# Patient Record
Sex: Female | Born: 1940 | Race: White | Hispanic: No | Marital: Married | Smoking: Never smoker
Health system: Southern US, Community
[De-identification: ages and names within clinical notes are randomized; demographics above are authoritative.]

## PROBLEM LIST (undated history)

## (undated) DIAGNOSIS — F32A Depression, unspecified: Secondary | ICD-10-CM

## (undated) DIAGNOSIS — E7800 Pure hypercholesterolemia, unspecified: Secondary | ICD-10-CM

## (undated) DIAGNOSIS — F329 Major depressive disorder, single episode, unspecified: Secondary | ICD-10-CM

## (undated) DIAGNOSIS — I10 Essential (primary) hypertension: Secondary | ICD-10-CM

## (undated) DIAGNOSIS — K219 Gastro-esophageal reflux disease without esophagitis: Secondary | ICD-10-CM

## (undated) DIAGNOSIS — I471 Supraventricular tachycardia, unspecified: Secondary | ICD-10-CM

## (undated) DIAGNOSIS — M1990 Unspecified osteoarthritis, unspecified site: Secondary | ICD-10-CM

## (undated) DIAGNOSIS — N393 Stress incontinence (female) (male): Secondary | ICD-10-CM

## (undated) DIAGNOSIS — E119 Type 2 diabetes mellitus without complications: Secondary | ICD-10-CM

## (undated) DIAGNOSIS — G4730 Sleep apnea, unspecified: Secondary | ICD-10-CM

## (undated) DIAGNOSIS — F419 Anxiety disorder, unspecified: Secondary | ICD-10-CM

## (undated) DIAGNOSIS — I639 Cerebral infarction, unspecified: Secondary | ICD-10-CM

## (undated) HISTORY — DX: Anxiety disorder, unspecified: F41.9

## (undated) HISTORY — PX: SCALENOTOMY W/O RESECTION CERVICAL RIB: SUR1288

## (undated) HISTORY — PX: BREAST SURGERY: SHX581

## (undated) HISTORY — DX: Essential (primary) hypertension: I10

## (undated) HISTORY — PX: EYE SURGERY: SHX253

## (undated) HISTORY — DX: Type 2 diabetes mellitus without complications: E11.9

## (undated) HISTORY — DX: Depression, unspecified: F32.A

## (undated) HISTORY — PX: CHOLECYSTECTOMY: SHX55

## (undated) HISTORY — DX: Stress incontinence (female) (male): N39.3

## (undated) HISTORY — DX: Supraventricular tachycardia, unspecified: I47.10

## (undated) HISTORY — DX: Gastro-esophageal reflux disease without esophagitis: K21.9

## (undated) HISTORY — DX: Supraventricular tachycardia: I47.1

## (undated) HISTORY — DX: Pure hypercholesterolemia, unspecified: E78.00

## (undated) HISTORY — PX: GALLBLADDER SURGERY: SHX652

## (undated) HISTORY — DX: Cerebral infarction, unspecified: I63.9

## (undated) HISTORY — DX: Major depressive disorder, single episode, unspecified: F32.9

## (undated) HISTORY — PX: ABDOMINAL HYSTERECTOMY: SHX81

## (undated) HISTORY — DX: Sleep apnea, unspecified: G47.30

---

## 1975-08-31 HISTORY — PX: BREAST BIOPSY: SHX20

## 1986-08-30 HISTORY — PX: BREAST BIOPSY: SHX20

## 2004-09-29 ENCOUNTER — Ambulatory Visit: Payer: Self-pay | Admitting: Internal Medicine

## 2005-07-20 ENCOUNTER — Ambulatory Visit: Payer: Self-pay | Admitting: Orthopaedic Surgery

## 2005-08-29 ENCOUNTER — Emergency Department: Payer: Self-pay | Admitting: Emergency Medicine

## 2005-10-04 ENCOUNTER — Ambulatory Visit: Payer: Self-pay | Admitting: Internal Medicine

## 2006-04-01 ENCOUNTER — Ambulatory Visit: Payer: Self-pay | Admitting: Internal Medicine

## 2006-05-05 ENCOUNTER — Ambulatory Visit: Payer: Self-pay | Admitting: Internal Medicine

## 2007-02-21 ENCOUNTER — Ambulatory Visit: Payer: Self-pay | Admitting: Unknown Physician Specialty

## 2007-04-11 ENCOUNTER — Ambulatory Visit: Payer: Self-pay | Admitting: Internal Medicine

## 2007-06-09 ENCOUNTER — Ambulatory Visit: Payer: Self-pay | Admitting: Internal Medicine

## 2008-01-03 ENCOUNTER — Ambulatory Visit: Payer: Self-pay | Admitting: Internal Medicine

## 2008-04-01 ENCOUNTER — Ambulatory Visit: Payer: Self-pay | Admitting: Internal Medicine

## 2008-04-17 ENCOUNTER — Ambulatory Visit: Payer: Self-pay | Admitting: Internal Medicine

## 2008-10-17 ENCOUNTER — Ambulatory Visit: Payer: Self-pay | Admitting: Internal Medicine

## 2008-10-24 ENCOUNTER — Ambulatory Visit: Payer: Self-pay | Admitting: Internal Medicine

## 2008-11-28 ENCOUNTER — Ambulatory Visit: Payer: Self-pay | Admitting: Internal Medicine

## 2009-04-30 ENCOUNTER — Ambulatory Visit: Payer: Self-pay | Admitting: Internal Medicine

## 2009-05-13 ENCOUNTER — Ambulatory Visit: Payer: Self-pay | Admitting: Internal Medicine

## 2009-12-08 ENCOUNTER — Ambulatory Visit: Payer: Self-pay | Admitting: Internal Medicine

## 2010-06-03 ENCOUNTER — Ambulatory Visit: Payer: Self-pay | Admitting: Internal Medicine

## 2010-10-02 ENCOUNTER — Ambulatory Visit: Payer: Self-pay | Admitting: Internal Medicine

## 2010-12-16 ENCOUNTER — Emergency Department: Payer: Self-pay | Admitting: Emergency Medicine

## 2011-05-24 ENCOUNTER — Ambulatory Visit: Payer: Self-pay | Admitting: Unknown Physician Specialty

## 2011-05-27 ENCOUNTER — Ambulatory Visit: Payer: Self-pay

## 2011-05-31 ENCOUNTER — Ambulatory Visit: Payer: Self-pay

## 2011-06-15 ENCOUNTER — Ambulatory Visit: Payer: Self-pay | Admitting: Internal Medicine

## 2011-07-01 ENCOUNTER — Ambulatory Visit: Payer: Self-pay

## 2011-07-31 ENCOUNTER — Ambulatory Visit: Payer: Self-pay

## 2011-08-31 ENCOUNTER — Ambulatory Visit: Payer: Self-pay

## 2011-10-21 ENCOUNTER — Ambulatory Visit: Payer: Self-pay | Admitting: Internal Medicine

## 2011-11-16 ENCOUNTER — Ambulatory Visit: Payer: Self-pay | Admitting: Internal Medicine

## 2012-02-08 ENCOUNTER — Ambulatory Visit: Payer: Self-pay | Admitting: Internal Medicine

## 2012-03-30 ENCOUNTER — Ambulatory Visit: Payer: Self-pay | Admitting: Internal Medicine

## 2012-03-31 ENCOUNTER — Emergency Department: Payer: Self-pay | Admitting: *Deleted

## 2012-03-31 LAB — URINALYSIS, COMPLETE
Blood: NEGATIVE
Glucose,UR: NEGATIVE mg/dL (ref 0–75)
Leukocyte Esterase: NEGATIVE
Nitrite: NEGATIVE
Protein: NEGATIVE
RBC,UR: <1 /HPF (ref 0–5)
Squamous Epithelial: 1
WBC UR: <1 /HPF (ref 0–5)

## 2012-03-31 LAB — COMPREHENSIVE METABOLIC PANEL
Albumin: 3.6 g/dL (ref 3.4–5.0)
Alkaline Phosphatase: 63 U/L (ref 50–136)
Anion Gap: 10 (ref 7–16)
BUN: 15 mg/dL (ref 7–18)
Calcium, Total: 9.3 mg/dL (ref 8.5–10.1)
Chloride: 101 mmol/L (ref 98–107)
Co2: 27 mmol/L (ref 21–32)
Creatinine: 1.01 mg/dL (ref 0.60–1.30)
EGFR (African American): >60
EGFR (Non-African Amer.): 56 — ABNORMAL LOW
Glucose: 140 mg/dL — ABNORMAL HIGH (ref 65–99)
Potassium: 3.4 mmol/L — ABNORMAL LOW (ref 3.5–5.1)
SGPT (ALT): 28 U/L
Total Protein: 7.6 g/dL (ref 6.4–8.2)

## 2012-03-31 LAB — CBC
HCT: 38.3 % (ref 35.0–47.0)
HGB: 12.5 g/dL (ref 12.0–16.0)
MCHC: 32.6 g/dL (ref 32.0–36.0)
MCV: 82 fL (ref 80–100)
RDW: 15.7 % — ABNORMAL HIGH (ref 11.5–14.5)
WBC: 11.8 10*3/uL — ABNORMAL HIGH (ref 3.6–11.0)

## 2012-08-01 ENCOUNTER — Ambulatory Visit: Payer: Self-pay | Admitting: Internal Medicine

## 2012-08-14 ENCOUNTER — Ambulatory Visit: Payer: Self-pay

## 2012-09-25 ENCOUNTER — Ambulatory Visit: Payer: Self-pay

## 2012-09-30 ENCOUNTER — Ambulatory Visit: Payer: Self-pay

## 2012-10-28 ENCOUNTER — Ambulatory Visit: Payer: Self-pay

## 2013-01-30 ENCOUNTER — Ambulatory Visit: Payer: Self-pay | Admitting: Internal Medicine

## 2013-08-03 ENCOUNTER — Ambulatory Visit: Payer: Self-pay | Admitting: Internal Medicine

## 2013-08-30 DIAGNOSIS — T8859XA Other complications of anesthesia, initial encounter: Secondary | ICD-10-CM

## 2013-08-30 HISTORY — DX: Other complications of anesthesia, initial encounter: T88.59XA

## 2013-09-14 ENCOUNTER — Ambulatory Visit: Payer: Self-pay | Admitting: Internal Medicine

## 2013-11-09 DIAGNOSIS — I10 Essential (primary) hypertension: Secondary | ICD-10-CM | POA: Insufficient documentation

## 2013-11-09 DIAGNOSIS — N3281 Overactive bladder: Secondary | ICD-10-CM | POA: Insufficient documentation

## 2013-11-09 DIAGNOSIS — E119 Type 2 diabetes mellitus without complications: Secondary | ICD-10-CM | POA: Insufficient documentation

## 2013-11-09 DIAGNOSIS — F32A Depression, unspecified: Secondary | ICD-10-CM | POA: Insufficient documentation

## 2013-11-09 DIAGNOSIS — K219 Gastro-esophageal reflux disease without esophagitis: Secondary | ICD-10-CM | POA: Insufficient documentation

## 2013-11-09 DIAGNOSIS — G459 Transient cerebral ischemic attack, unspecified: Secondary | ICD-10-CM | POA: Insufficient documentation

## 2013-11-09 DIAGNOSIS — H269 Unspecified cataract: Secondary | ICD-10-CM | POA: Insufficient documentation

## 2013-12-19 ENCOUNTER — Ambulatory Visit: Payer: Self-pay | Admitting: Family Medicine

## 2013-12-19 ENCOUNTER — Emergency Department: Payer: Self-pay | Admitting: Internal Medicine

## 2013-12-19 LAB — CLOSTRIDIUM DIFFICILE(ARMC)

## 2013-12-19 LAB — URINALYSIS, COMPLETE
Blood: NEGATIVE
Glucose,UR: NEGATIVE mg/dL (ref 0–75)
Ketone: NEGATIVE
Nitrite: NEGATIVE
PH: 6 (ref 4.5–8.0)
Protein: 30
SPECIFIC GRAVITY: 1.025 (ref 1.003–1.030)

## 2013-12-19 LAB — COMPREHENSIVE METABOLIC PANEL
ALBUMIN: 3.7 g/dL (ref 3.4–5.0)
ALK PHOS: 78 U/L
AST: 81 U/L — AB (ref 15–37)
Anion Gap: 6 — ABNORMAL LOW (ref 7–16)
BUN: 12 mg/dL (ref 7–18)
Bilirubin,Total: 0.6 mg/dL (ref 0.2–1.0)
CALCIUM: 9.1 mg/dL (ref 8.5–10.1)
CO2: 26 mmol/L (ref 21–32)
Chloride: 103 mmol/L (ref 98–107)
Creatinine: 1.07 mg/dL (ref 0.60–1.30)
EGFR (African American): 60 — ABNORMAL LOW
GFR CALC NON AF AMER: 51 — AB
Glucose: 145 mg/dL — ABNORMAL HIGH (ref 65–99)
OSMOLALITY: 272 (ref 275–301)
Potassium: 3.7 mmol/L (ref 3.5–5.1)
SGPT (ALT): 85 U/L — ABNORMAL HIGH (ref 12–78)
SODIUM: 135 mmol/L — AB (ref 136–145)
Total Protein: 8 g/dL (ref 6.4–8.2)

## 2013-12-19 LAB — CBC WITH DIFFERENTIAL/PLATELET
BASOS ABS: 0 10*3/uL (ref 0.0–0.1)
BASOS PCT: 0.6 %
Eosinophil #: 0.1 10*3/uL (ref 0.0–0.7)
Eosinophil %: 0.9 %
HCT: 41.5 % (ref 35.0–47.0)
HGB: 12.9 g/dL (ref 12.0–16.0)
Lymphocyte #: 0.5 10*3/uL — ABNORMAL LOW (ref 1.0–3.6)
Lymphocyte %: 6.7 %
MCH: 24.8 pg — ABNORMAL LOW (ref 26.0–34.0)
MCHC: 31.2 g/dL — ABNORMAL LOW (ref 32.0–36.0)
MCV: 80 fL (ref 80–100)
MONO ABS: 0.5 x10 3/mm (ref 0.2–0.9)
Monocyte %: 7.2 %
NEUTROS PCT: 84.6 %
Neutrophil #: 6.5 10*3/uL (ref 1.4–6.5)
PLATELETS: 277 10*3/uL (ref 150–440)
RBC: 5.2 10*6/uL (ref 3.80–5.20)
RDW: 17.2 % — ABNORMAL HIGH (ref 11.5–14.5)
WBC: 7.7 10*3/uL (ref 3.6–11.0)

## 2013-12-19 LAB — LIPASE, BLOOD: Lipase: 112 U/L (ref 73–393)

## 2013-12-19 LAB — OCCULT BLOOD X 1 CARD TO LAB, STOOL: Occult Blood, Feces: NEGATIVE

## 2013-12-21 LAB — WBCS, STOOL

## 2013-12-21 LAB — STOOL CULTURE

## 2014-02-06 DIAGNOSIS — E785 Hyperlipidemia, unspecified: Secondary | ICD-10-CM | POA: Insufficient documentation

## 2014-02-06 DIAGNOSIS — D649 Anemia, unspecified: Secondary | ICD-10-CM | POA: Insufficient documentation

## 2014-02-06 DIAGNOSIS — E559 Vitamin D deficiency, unspecified: Secondary | ICD-10-CM | POA: Insufficient documentation

## 2014-05-10 ENCOUNTER — Ambulatory Visit: Payer: Self-pay

## 2014-05-10 DIAGNOSIS — R1310 Dysphagia, unspecified: Secondary | ICD-10-CM | POA: Insufficient documentation

## 2014-05-10 DIAGNOSIS — Z800 Family history of malignant neoplasm of digestive organs: Secondary | ICD-10-CM | POA: Insufficient documentation

## 2014-05-16 ENCOUNTER — Encounter: Payer: Self-pay | Admitting: Podiatry

## 2014-05-16 ENCOUNTER — Other Ambulatory Visit: Payer: Self-pay | Admitting: *Deleted

## 2014-05-16 ENCOUNTER — Ambulatory Visit: Payer: Self-pay

## 2014-05-16 ENCOUNTER — Ambulatory Visit (INDEPENDENT_AMBULATORY_CARE_PROVIDER_SITE_OTHER): Payer: Medicare Other | Admitting: Podiatry

## 2014-05-16 VITALS — BP 126/74 | HR 78 | Resp 16 | Ht 66.0 in | Wt 205.0 lb

## 2014-05-16 DIAGNOSIS — E119 Type 2 diabetes mellitus without complications: Secondary | ICD-10-CM

## 2014-05-16 DIAGNOSIS — M722 Plantar fascial fibromatosis: Secondary | ICD-10-CM

## 2014-05-16 NOTE — Patient Instructions (Signed)
Plantar Fasciitis (Heel Spur Syndrome) with Rehab The plantar fascia is a fibrous, ligament-like, soft-tissue structure that spans the bottom of the foot. Plantar fasciitis is a condition that causes pain in the foot due to inflammation of the tissue. SYMPTOMS   Pain and tenderness on the underneath side of the foot.  Pain that worsens with standing or walking. CAUSES  Plantar fasciitis is caused by irritation and injury to the plantar fascia on the underneath side of the foot. Common mechanisms of injury include:  Direct trauma to bottom of the foot.  Damage to a small nerve that runs under the foot where the main fascia attaches to the heel bone.  Stress placed on the plantar fascia due to bone spurs. RISK INCREASES WITH:   Activities that place stress on the plantar fascia (running, jumping, pivoting, or cutting).  Poor strength and flexibility.  Improperly fitted shoes.  Tight calf muscles.  Flat feet.  Failure to warm-up properly before activity.  Obesity. PREVENTION  Warm up and stretch properly before activity.  Allow for adequate recovery between workouts.  Maintain physical fitness:  Strength, flexibility, and endurance.  Cardiovascular fitness.  Maintain a health body weight.  Avoid stress on the plantar fascia.  Wear properly fitted shoes, including arch supports for individuals who have flat feet.  PROGNOSIS  If treated properly, then the symptoms of plantar fasciitis usually resolve without surgery. However, occasionally surgery is necessary.  RELATED COMPLICATIONS   Recurrent symptoms that may result in a chronic condition.  Problems of the lower back that are caused by compensating for the injury, such as limping.  Pain or weakness of the foot during push-off following surgery.  Chronic inflammation, scarring, and partial or complete fascia tear, occurring more often from repeated injections.  TREATMENT  Treatment initially involves the  use of ice and medication to help reduce pain and inflammation. The use of strengthening and stretching exercises may help reduce pain with activity, especially stretches of the Achilles tendon. These exercises may be performed at home or with a therapist. Your caregiver may recommend that you use heel cups of arch supports to help reduce stress on the plantar fascia. Occasionally, corticosteroid injections are given to reduce inflammation. If symptoms persist for greater than 6 months despite non-surgical (conservative), then surgery may be recommended.   MEDICATION   If pain medication is necessary, then nonsteroidal anti-inflammatory medications, such as aspirin and ibuprofen, or other minor pain relievers, such as acetaminophen, are often recommended.  Do not take pain medication within 7 days before surgery.  Prescription pain relievers may be given if deemed necessary by your caregiver. Use only as directed and only as much as you need.  Corticosteroid injections may be given by your caregiver. These injections should be reserved for the most serious cases, because they may only be given a certain number of times.  HEAT AND COLD  Cold treatment (icing) relieves pain and reduces inflammation. Cold treatment should be applied for 10 to 15 minutes every 2 to 3 hours for inflammation and pain and immediately after any activity that aggravates your symptoms. Use ice packs or massage the area with a piece of ice (ice massage).  Heat treatment may be used prior to performing the stretching and strengthening activities prescribed by your caregiver, physical therapist, or athletic trainer. Use a heat pack or soak the injury in warm water.  SEEK IMMEDIATE MEDICAL CARE IF:  Treatment seems to offer no benefit, or the condition worsens.  Any medications   produce adverse side effects.  EXERCISES- RANGE OF MOTION (ROM) AND STRETCHING EXERCISES - Plantar Fasciitis (Heel Spur Syndrome) These exercises  may help you when beginning to rehabilitate your injury. Your symptoms may resolve with or without further involvement from your physician, physical therapist or athletic trainer. While completing these exercises, remember:   Restoring tissue flexibility helps normal motion to return to the joints. This allows healthier, less painful movement and activity.  An effective stretch should be held for at least 30 seconds.  A stretch should never be painful. You should only feel a gentle lengthening or release in the stretched tissue.  RANGE OF MOTION - Toe Extension, Flexion  Sit with your right / left leg crossed over your opposite knee.  Grasp your toes and gently pull them back toward the top of your foot. You should feel a stretch on the bottom of your toes and/or foot.  Hold this stretch for 10 seconds.  Now, gently pull your toes toward the bottom of your foot. You should feel a stretch on the top of your toes and or foot.  Hold this stretch for 10 seconds. Repeat  times. Complete this stretch 3 times per day.   RANGE OF MOTION - Ankle Dorsiflexion, Active Assisted  Remove shoes and sit on a chair that is preferably not on a carpeted surface.  Place right / left foot under knee. Extend your opposite leg for support.  Keeping your heel down, slide your right / left foot back toward the chair until you feel a stretch at your ankle or calf. If you do not feel a stretch, slide your bottom forward to the edge of the chair, while still keeping your heel down.  Hold this stretch for 10 seconds. Repeat 3 times. Complete this stretch 2 times per day.   STRETCH  Gastroc, Standing  Place hands on wall.  Extend right / left leg, keeping the front knee somewhat bent.  Slightly point your toes inward on your back foot.  Keeping your right / left heel on the floor and your knee straight, shift your weight toward the wall, not allowing your back to arch.  You should feel a gentle stretch  in the right / left calf. Hold this position for 10 seconds. Repeat 3 times. Complete this stretch 2 times per day.  STRETCH  Soleus, Standing  Place hands on wall.  Extend right / left leg, keeping the other knee somewhat bent.  Slightly point your toes inward on your back foot.  Keep your right / left heel on the floor, bend your back knee, and slightly shift your weight over the back leg so that you feel a gentle stretch deep in your back calf.  Hold this position for 10 seconds. Repeat 3 times. Complete this stretch 2 times per day.  STRETCH  Gastrocsoleus, Standing  Note: This exercise can place a lot of stress on your foot and ankle. Please complete this exercise only if specifically instructed by your caregiver.   Place the ball of your right / left foot on a step, keeping your other foot firmly on the same step.  Hold on to the wall or a rail for balance.  Slowly lift your other foot, allowing your body weight to press your heel down over the edge of the step.  You should feel a stretch in your right / left calf.  Hold this position for 10 seconds.  Repeat this exercise with a slight bend in your right /   left knee. Repeat 3 times. Complete this stretch 2 times per day.   STRENGTHENING EXERCISES - Plantar Fasciitis (Heel Spur Syndrome)  These exercises may help you when beginning to rehabilitate your injury. They may resolve your symptoms with or without further involvement from your physician, physical therapist or athletic trainer. While completing these exercises, remember:   Muscles can gain both the endurance and the strength needed for everyday activities through controlled exercises.  Complete these exercises as instructed by your physician, physical therapist or athletic trainer. Progress the resistance and repetitions only as guided.  STRENGTH - Towel Curls  Sit in a chair positioned on a non-carpeted surface.  Place your foot on a towel, keeping your heel  on the floor.  Pull the towel toward your heel by only curling your toes. Keep your heel on the floor. Repeat 3 times. Complete this exercise 2 times per day.  STRENGTH - Ankle Inversion  Secure one end of a rubber exercise band/tubing to a fixed object (table, pole). Loop the other end around your foot just before your toes.  Place your fists between your knees. This will focus your strengthening at your ankle.  Slowly, pull your big toe up and in, making sure the band/tubing is positioned to resist the entire motion.  Hold this position for 10 seconds.  Have your muscles resist the band/tubing as it slowly pulls your foot back to the starting position. Repeat 3 times. Complete this exercises 2 times per day.  Document Released: 08/16/2005 Document Revised: 11/08/2011 Document Reviewed: 11/28/2008 [facility] Patient Information 2014 [place], [place]. Diabetes and Foot Care Diabetes may cause you to have problems because of poor blood supply (circulation) to your feet and legs. This may cause the skin on your feet to become thinner, break easier, and heal more slowly. Your skin may become dry, and the skin may peel and crack. You may also have nerve damage in your legs and feet causing decreased feeling in them. You may not notice minor injuries to your feet that could lead to infections or more serious problems. Taking care of your feet is one of the most important things you can do for yourself.  HOME CARE INSTRUCTIONS  Wear shoes at all times, even in the house. Do not go barefoot. Bare feet are easily injured.  Check your feet daily for blisters, cuts, and redness. If you cannot see the bottom of your feet, use a mirror or ask someone for help.  Wash your feet with warm water (do not use hot water) and mild soap. Then pat your feet and the areas between your toes until they are completely dry. Do not soak your feet as this can dry your skin.  Apply a moisturizing lotion or petroleum  jelly (that does not contain alcohol and is unscented) to the skin on your feet and to dry, brittle toenails. Do not apply lotion between your toes.  Trim your toenails straight across. Do not dig under them or around the cuticle. File the edges of your nails with an emery board or nail file.  Do not cut corns or calluses or try to remove them with medicine.  Wear clean socks or stockings every day. Make sure they are not too tight. Do not wear knee-high stockings since they may decrease blood flow to your legs.  Wear shoes that fit properly and have enough cushioning. To break in new shoes, wear them for just a few hours a day. This prevents you from injuring your  feet. Always look in your shoes before you put them on to be sure there are no objects inside.  Do not cross your legs. This may decrease the blood flow to your feet.  If you find a minor scrape, cut, or break in the skin on your feet, keep it and the skin around it clean and dry. These areas may be cleansed with mild soap and water. Do not cleanse the area with peroxide, alcohol, or iodine.  When you remove an adhesive bandage, be sure not to damage the skin around it.  If you have a wound, look at it several times a day to make sure it is healing.  Do not use heating pads or hot water bottles. They may burn your skin. If you have lost feeling in your feet or legs, you may not know it is happening until it is too late.  Make sure your health care provider performs a complete foot exam at least annually or more often if you have foot problems. Report any cuts, sores, or bruises to your health care provider immediately. SEEK MEDICAL CARE IF:   You have an injury that is not healing.  You have cuts or breaks in the skin.  You have an ingrown nail.  You notice redness on your legs or feet.  You feel burning or tingling in your legs or feet.  You have pain or cramps in your legs and feet.  Your legs or feet are numb.  Your  feet always feel cold. SEEK IMMEDIATE MEDICAL CARE IF:   There is increasing redness, swelling, or pain in or around a wound.  There is a red line that goes up your leg.  Pus is coming from a wound.  You develop a fever or as directed by your health care provider.  You notice a bad smell coming from an ulcer or wound. Document Released: 08/13/2000 Document Revised: 04/18/2013 Document Reviewed: 01/23/2013 [facility] Patient Information 2015 [place], [place]. This information is not intended to replace advice given to you by your health care provider. Make sure you discuss any questions you have with your health care provider.

## 2014-05-16 NOTE — Progress Notes (Signed)
   Subjective:    Patient ID: Kristen Guerra, female    DOB: May 21, 1941, 73 y.o.   MRN: [id number]  HPI Comments: About two weeks ago was walking up a ramp and all of a sudden i could not take another step.  Left plantar heel pain, it sometimes it feels like a jabbing pain. Went to urgent care and they put me in a boot and gave me oxycodone for it. i am diabetic been for 3 years the last a1c was 6.8   Foot Injury       Review of Systems  Constitutional: Positive for fatigue.  HENT: Positive for trouble swallowing.   Respiratory: Positive for cough.   Gastrointestinal: Positive for diarrhea and constipation.       Bloating  Endocrine:       Diabetic   Musculoskeletal: Positive for back pain.  All other systems reviewed and are negative.      Objective:   Physical Exam: I have reviewed her past medical history medications allergies surgeries social history and review of systems. Pulses are strongly palpable bilateral. Neurologic sensorium is intact per Semmes-Weinstein monofilament. Deep tendon reflexes are intact bilateral muscle strength is 5 over 5 dorsiflexors plantar flexors inverters everters all intrinsic musculature is intact. Orthopedic evaluation demonstrates ambulation with a cane an antalgic gait to the left side of the body. She has pain on palpation of the medial calcaneal tubercle of the left heel. Otherwise she has a full range of motion all joints distal to the ankle. She has no pain on medial and lateral compression of the calcaneus. Radiographic evaluation does demonstrate obliteration of the soft tissue margin near the insertion site of the plantar fascia. At this point we're considering either plantar fasciitis or a plantar fascial tear.        Assessment & Plan:  Assessment: Plantar fasciitis left possible plantar fascial tear left.  Plan: Discussed etiology pathology conservative versus surgical therapies. Injected the left heel with Kenalog and Marcaine.  She will continue the use of her short Cam Walker we discussed boot socks. We also dispensed a night splint.

## 2014-06-17 ENCOUNTER — Ambulatory Visit: Payer: Medicare Other | Admitting: Podiatry

## 2014-06-24 ENCOUNTER — Ambulatory Visit: Payer: Self-pay | Admitting: Unknown Physician Specialty

## 2014-06-26 ENCOUNTER — Encounter: Payer: Self-pay | Admitting: Podiatry

## 2014-06-26 ENCOUNTER — Ambulatory Visit (INDEPENDENT_AMBULATORY_CARE_PROVIDER_SITE_OTHER): Payer: Medicare Other | Admitting: Podiatry

## 2014-06-26 DIAGNOSIS — M722 Plantar fascial fibromatosis: Secondary | ICD-10-CM

## 2014-06-26 DIAGNOSIS — E119 Type 2 diabetes mellitus without complications: Secondary | ICD-10-CM

## 2014-06-26 NOTE — Progress Notes (Signed)
She presents today for follow-up of her plantar fasciitis. She states that she is 100% better. She continues all conservative therapies at home.  Objective: Vital signs are stable she is alert and oriented 3. She has no pain on palpation medial calcaneal tubercle right foot.  Assessment: Well-healing plantar fasciitis near 100%.  Plan: Continue all conservative therapies 1 month.

## 2014-11-01 ENCOUNTER — Ambulatory Visit: Payer: Self-pay | Admitting: Internal Medicine

## 2014-11-19 ENCOUNTER — Ambulatory Visit: Payer: Self-pay | Admitting: Physician Assistant

## 2014-12-23 LAB — SURGICAL PATHOLOGY

## 2014-12-31 ENCOUNTER — Other Ambulatory Visit: Payer: Self-pay

## 2014-12-31 DIAGNOSIS — Z1231 Encounter for screening mammogram for malignant neoplasm of breast: Secondary | ICD-10-CM

## 2015-01-21 ENCOUNTER — Ambulatory Visit
Admission: RE | Admit: 2015-01-21 | Discharge: 2015-01-21 | Disposition: A | Discharge status: Home / Self Care | Payer: Medicare Other | Source: Ambulatory Visit | Attending: Internal Medicine | Admitting: Internal Medicine

## 2015-01-21 ENCOUNTER — Other Ambulatory Visit: Payer: Self-pay

## 2015-01-21 DIAGNOSIS — Z1231 Encounter for screening mammogram for malignant neoplasm of breast: Secondary | ICD-10-CM

## 2015-08-31 DIAGNOSIS — I639 Cerebral infarction, unspecified: Secondary | ICD-10-CM

## 2015-08-31 HISTORY — DX: Cerebral infarction, unspecified: I63.9

## 2015-11-12 ENCOUNTER — Emergency Department
Admission: EM | Admit: 2015-11-12 | Discharge: 2015-11-12 | Disposition: A | Discharge status: Home / Self Care | Payer: Medicare Other | Attending: Emergency Medicine | Admitting: Emergency Medicine

## 2015-11-12 DIAGNOSIS — Z791 Long term (current) use of non-steroidal anti-inflammatories (NSAID): Secondary | ICD-10-CM | POA: Diagnosis not present

## 2015-11-12 DIAGNOSIS — E119 Type 2 diabetes mellitus without complications: Secondary | ICD-10-CM | POA: Diagnosis not present

## 2015-11-12 DIAGNOSIS — F410 Panic disorder [episodic paroxysmal anxiety] without agoraphobia: Secondary | ICD-10-CM

## 2015-11-12 DIAGNOSIS — Z7984 Long term (current) use of oral hypoglycemic drugs: Secondary | ICD-10-CM | POA: Insufficient documentation

## 2015-11-12 DIAGNOSIS — I10 Essential (primary) hypertension: Secondary | ICD-10-CM | POA: Diagnosis not present

## 2015-11-12 DIAGNOSIS — Z880 Allergy status to penicillin: Secondary | ICD-10-CM | POA: Insufficient documentation

## 2015-11-12 DIAGNOSIS — Z79899 Other long term (current) drug therapy: Secondary | ICD-10-CM | POA: Diagnosis not present

## 2015-11-12 DIAGNOSIS — R251 Tremor, unspecified: Secondary | ICD-10-CM | POA: Diagnosis present

## 2015-11-12 LAB — COMPREHENSIVE METABOLIC PANEL
ALT: 16 U/L (ref 14–54)
ANION GAP: 7 (ref 5–15)
AST: 18 U/L (ref 15–41)
Albumin: 3.8 g/dL (ref 3.5–5.0)
Alkaline Phosphatase: 64 U/L (ref 38–126)
BUN: 18 mg/dL (ref 6–20)
CHLORIDE: 105 mmol/L (ref 101–111)
CO2: 26 mmol/L (ref 22–32)
Calcium: 9.4 mg/dL (ref 8.9–10.3)
Creatinine, Ser: 0.87 mg/dL (ref 0.44–1.00)
GFR calc Af Amer: >60 mL/min (ref >60)
GFR calc non Af Amer: >60 mL/min (ref >60)
Glucose, Bld: 131 mg/dL — ABNORMAL HIGH (ref 65–99)
Potassium: 3.1 mmol/L — ABNORMAL LOW (ref 3.5–5.1)
Sodium: 138 mmol/L (ref 135–145)
Total Bilirubin: 0.5 mg/dL (ref 0.3–1.2)
Total Protein: 7.4 g/dL (ref 6.5–8.1)

## 2015-11-12 LAB — CBC
HCT: 39 % (ref 35.0–47.0)
Hemoglobin: 12.2 g/dL (ref 12.0–16.0)
MCH: 23.8 pg — AB (ref 26.0–34.0)
MCHC: 31.2 g/dL — ABNORMAL LOW (ref 32.0–36.0)
MCV: 76.3 fL — AB (ref 80.0–100.0)
Platelets: 394 10*3/uL (ref 150–440)
RBC: 5.11 MIL/uL (ref 3.80–5.20)
RDW: 18.5 % — AB (ref 11.5–14.5)
WBC: 12.7 10*3/uL — AB (ref 3.6–11.0)

## 2015-11-12 LAB — TROPONIN I

## 2015-11-12 MED ORDER — LORAZEPAM 1 MG PO TABS
1.0000 mg | ORAL_TABLET | Freq: Once | ORAL | Status: AC
  Administered 2015-11-12: 1 mg via ORAL
  Filled 2015-11-12: qty 1

## 2015-11-12 NOTE — Discharge Instructions (Signed)

## 2015-11-12 NOTE — ED Provider Notes (Signed)
[facility] Emergency Department Provider Note  ____________________________________________  Time seen: 4:00 AM  I have reviewed the triage vital signs and the nursing notes.   HISTORY  Chief Complaint Shaking and Hypertension      HPI Kristen Guerra is a 75 y.o. female presents with "feeling restless tonight". Patient states that she awoke feeling restless and a such checked her blood pressure blood sugar which were slightly higher than normal. Patient states this made her very anxious and as such she started reading about it on the Internet which made her more anxious.     Past Medical History  Diagnosis Date  . Diabetes mellitus without complication ([place])   . Stroke ([place])   . Hypertension   . High cholesterol     There are no active problems to display for this patient.   Past Surgical History  Procedure Laterality Date  . Abdominal hysterectomy    . Breast surgery    . Gallbladder surgery    . Breast biopsy Right 1977    negative  . Breast biopsy Right 1988    negative    Current Outpatient Rx  Name  Route  Sig  Dispense  Refill  . chlorpheniramine-HYDROcodone (TUSSIONEX PENNKINETIC ER) 10-8 MG/5ML LQCR   Oral   Take 5 mLs by mouth.         . Cholecalciferol 10000 UNITS CAPS   Oral   Take by mouth.         . clobetasol ointment (TEMOVATE) 0.05 %               . clopidogrel (PLAVIX) 75 MG tablet               . cyanocobalamin 1000 MCG tablet   Oral   Take 100 mcg by mouth daily.         . DULoxetine (CYMBALTA) 60 MG capsule               . glimepiride (AMARYL) 4 MG tablet   Oral   Take 4 mg by mouth daily with breakfast.         . hydrochlorothiazide (HYDRODIURIL) 25 MG tablet               . HYDROcodone-acetaminophen (NORCO/VICODIN) 5-325 MG per tablet               . JANUVIA 100 MG tablet               . levofloxacin (LEVAQUIN) 500 MG tablet               . losartan  (COZAAR) 25 MG tablet               . Magnesium Oxide -Mg Supplement 400 MG CAPS   Oral   Take by mouth.         . metoprolol succinate (TOPROL-XL) 25 MG 24 hr tablet               . naproxen sodium (ANAPROX) 220 MG tablet   Oral   Take 220 mg by mouth 2 (two) times daily with a meal.         . NEXIUM 40 MG capsule               . ONE TOUCH ULTRA TEST test strip               . oxybutynin (DITROPAN-XL) 10 MG 24 hr tablet               .  verapamil (CALAN-SR) 180 MG CR tablet                 Allergies Amoxicillin; Lidocaine; and Penicillins  Family History  Problem Relation Age of Onset  . Colon cancer Father     Social History Social History  Substance Use Topics  . Smoking status: Never Smoker   . Smokeless tobacco: Never Used  . Alcohol Use: None    Review of Systems  Constitutional: Negative for fever. Eyes: Negative for visual changes. ENT: Negative for sore throat. Cardiovascular: Negative for chest pain. Respiratory: Negative for shortness of breath. Gastrointestinal: Negative for abdominal pain, vomiting and diarrhea. Genitourinary: Negative for dysuria. Musculoskeletal: Negative for back pain. Skin: Negative for rash. Neurological: Negative for headaches, focal weakness or numbness. Psychiatric: Positive for anxiety  10-point ROS otherwise negative.  ____________________________________________   PHYSICAL EXAM:  VITAL SIGNS: ED Triage Vitals  Enc Vitals Group     BP 11/12/15 0145 135/70 mmHg     Pulse Rate 11/12/15 0145 90     Resp 11/12/15 0145 20     Temp 11/12/15 0145 98.2 F (36.8 C)     Temp Source 11/12/15 0145 Oral     SpO2 11/12/15 0145 96 %     Weight 11/12/15 0145 190 lb (86.183 kg)     Height 11/12/15 0145 5\' 6"  (1.676 m)     Head Cir --      Peak Flow --      Pain Score 11/12/15 0146 0     Pain Loc --      Pain Edu? --      Excl. in [place]? --     Constitutional: Alert and oriented. Well  appearing and in no distress. Eyes: Conjunctivae are normal. PERRL. Normal extraocular movements. ENT   Head: Normocephalic and atraumatic.   Nose: No congestion/rhinnorhea.   Mouth/Throat: Mucous membranes are moist.   Neck: No stridor. Hematological/Lymphatic/Immunilogical: No cervical lymphadenopathy. Cardiovascular: Normal rate, regular rhythm. Normal and symmetric distal pulses are present in all extremities. No murmurs, rubs, or gallops. Respiratory: Normal respiratory effort without tachypnea nor retractions. Breath sounds are clear and equal bilaterally. No wheezes/rales/rhonchi. Gastrointestinal: Soft and nontender. No distention. There is no CVA tenderness. Genitourinary: deferred Musculoskeletal: Nontender with normal range of motion in all extremities. No joint effusions.  No lower extremity tenderness nor edema. Neurologic:  Normal speech and language. No gross focal neurologic deficits are appreciated. Speech is normal.  Skin:  Skin is warm, dry and intact. No rash noted. Psychiatric: Anxious affect . Speech (rapid) and behavior are normal. Patient exhibits appropriate insight and judgment.  ____________________________________________    LABS (pertinent positives/negatives)  Labs Reviewed  CBC - Abnormal; Notable for the following:    WBC 12.7 (*)    MCV 76.3 (*)    MCH 23.8 (*)    MCHC 31.2 (*)    RDW 18.5 (*)    All other components within normal limits  COMPREHENSIVE METABOLIC PANEL - Abnormal; Notable for the following:    Potassium 3.1 (*)    Glucose, Bld 131 (*)    All other components within normal limits  TROPONIN I     ____________________________________________   EKG  ED ECG REPORT I, [place] [facility] Davidlee Jeanbaptiste, the attending physician, personally viewed and interpreted this ECG.   Date: 11/12/2015  EKG Time: 1:49 AM  Rate: 90  Rhythm: Normal sinus rhythm  Axis: Normal  Intervals: Normal  ST&T Change:  None   __________________  INITIAL IMPRESSION /  ASSESSMENT AND PLAN / ED COURSE  Pertinent labs & imaging results that were available during my care of the patient were reviewed by me and considered in my medical decision making (see chart for details).  History of physical exam consistent with acute panic attack. Patient has no pain or any other medical concerns at this time other than feeling anxious. Patient received Ativan 1 mg by mouth  ____________________________________________   FINAL CLINICAL IMPRESSION(S) / ED DIAGNOSES  Final diagnoses:  Panic attack      Gregor Hams, MD 11/12/15 [phone]

## 2015-11-12 NOTE — ED Notes (Signed)
Patient feeling restless tonight. After being in bed, not really asleep but just "awake and restless, checked her blood pressure and it was higher than normal. States she felt shaky so checked her blood pressure, blood sugar, temperature and pulse and just "didn't know what else to do." Patient very anxious in triage and talking rapidly about events at the dentist last week. States she had gas and she couldn't wake up. Thinks this might have something to do with it.

## 2016-01-28 ENCOUNTER — Encounter: Payer: Medicare Other | Attending: Internal Medicine | Admitting: *Deleted

## 2016-01-28 ENCOUNTER — Encounter: Payer: Self-pay | Admitting: *Deleted

## 2016-01-28 VITALS — BP 112/60 | Ht 66.0 in | Wt 191.9 lb

## 2016-01-28 DIAGNOSIS — E119 Type 2 diabetes mellitus without complications: Secondary | ICD-10-CM | POA: Diagnosis present

## 2016-01-28 NOTE — Patient Instructions (Addendum)
Check blood sugars 1 x day before breakfast or 2 hrs after supper every day Exercise: Walk for  20  minutes 3 days a week and gradually increase to 150 minutes/week Eat 3 meals day, 2  snacks a day Space meals 4-6 hours apart Allow 2-3 hours between meals and snacks Complete 3 Day Food Record and bring to next appt Bring blood sugar records to the next appointment Bring list of medications to next appointment Return on:  Wednesday July 19 at 11:00 am with [facility] (dietitian)

## 2016-01-29 NOTE — Progress Notes (Signed)
Diabetes Self-Management Education  Visit Type: First/Initial  Appt. Start Time: 1100 Appt. End Time: [id number]  01/28/2016  [name], identified by name and date of birth, is a 75 y.o. female with a diagnosis of Diabetes: Type 2.   ASSESSMENT  Blood pressure 112/60, height 5\' 6"  (1.676 m), weight 191 lb 14.4 oz (87.045 kg). Body mass index is 30.99 kg/(m^2).      Diabetes Self-Management Education - 01/28/16 1216    Visit Information   Visit Type First/Initial   Initial Visit   Diabetes Type Type 2   Are you currently following a meal plan? Yes   What type of meal plan do you follow? "diabetic"   Are you taking your medications as prescribed? Yes  Pt unsure of names and dosages of medications. Instructed her to bring list to next visit.    Date Diagnosed 5 years ago   Psychosocial Assessment   Patient Belief/Attitude about Diabetes Motivated to manage diabetes   Self-care barriers None   Self-management support Doctor's office;Family   Patient Concerns Nutrition/Meal planning;Medication;Glycemic Control;Weight Control   Special Needs None   Preferred Learning Style Auditory;Visual   Learning Readiness Ready   How often do you need to have someone help you when you read instructions, pamphlets, or other written materials from your doctor or pharmacy? 1 - Never   What is the last grade level you completed in school? MBA   Pre-Education Assessment   Patient understands the diabetes disease and treatment process. Needs Review   Patient understands incorporating nutritional management into lifestyle. Needs Review   Patient undertands incorporating physical activity into lifestyle. Needs Instruction   Patient understands using medications safely. Needs Instruction   Patient understands monitoring blood glucose, interpreting and using results Needs Review   Patient understands prevention, detection, and treatment of acute complications. Needs Review   Patient understands  prevention, detection, and treatment of chronic complications. Needs Review   Patient understands how to develop strategies to address psychosocial issues. Needs Review   Patient understands how to develop strategies to promote health/change behavior. Needs Review   Complications   Last HgB A1C per patient/outside source 6.7 %  12/23/15   How often do you check your blood sugar? 1-2 times/day   Fasting Blood glucose range (mg/dL) 70-129;130-179  Pt reports FBG's 90-130's mg/dL.    Have you had a dilated eye exam in the past 12 months? Yes   Have you had a dental exam in the past 12 months? Yes   Are you checking your feet? Yes   How many days per week are you checking your feet? 4   Dietary Intake   Breakfast egg 2 x week; cereal and milk; fruit   Snack (morning) crackers   Lunch soups; meat and veggies; salad and yogurt; cereal and milk   Snack (afternoon) fruit or popcicle   Dinner BBQ; meat and salad; meat and veggies, sweet potato   Beverage(s) water, coffee, unsweetened drinks   Exercise   Exercise Type ADL's   Patient Education   Previous Diabetes Education Yes (please comment)  approx 5 years ago here at [facility]   Disease state  Explored patient's options for treatment of their diabetes   Nutrition management  Role of diet in the treatment of diabetes and the relationship between the three main macronutrients and blood glucose level;Food label reading, portion sizes and measuring food.   Physical activity and exercise  Role of exercise on diabetes management, blood pressure control and  cardiac health.   Medications Reviewed patients medication for diabetes, action, purpose, timing of dose and side effects.   Monitoring Purpose and frequency of SMBG.;Identified appropriate SMBG and/or A1C goals.   Chronic complications Relationship between chronic complications and blood glucose control   Psychosocial adjustment Identified and addressed patients feelings and concerns about diabetes    Individualized Goals (developed by patient)   Reducing Risk Improve blood sugars Decrease medications Prevent diabetes complications Lose weight Become more fit   Outcomes   Expected Outcomes Demonstrated interest in learning. Expect positive outcomes      Individualized Plan for Diabetes Self-Management Training:   Learning Objective:  Patient will have a greater understanding of diabetes self-management. Patient education plan is to attend individual and/or group sessions per assessed needs and concerns.   Plan:   Patient Instructions  Check blood sugars 1 x day before breakfast or 2 hrs after supper every day Exercise: Walk for  20  minutes 3 days a week and gradually increase to 150 minutes/week Eat 3 meals day, 2  snacks a day Space meals 4-6 hours apart Allow 2-3 hours between meals and snacks Complete 3 Day Food Record and bring to next appt Bring blood sugar records to the next appointment Bring list of medications to next appointment Return on:  Wednesday July 19 at 11:00 am with [facility] (dietitian)   Expected Outcomes:  Demonstrated interest in learning. Expect positive outcomes  Education material provided:  General Meal Planning Guidelines Simple Meal Plan 3 Day Food Record  If problems or questions, patient to contact team via:  Kristen Drilling, RN, [place], CDE [phone]  Future DSME appointment:   March 17, 2016 for appointment with dietitian

## 2016-02-13 ENCOUNTER — Encounter: Payer: Self-pay | Admitting: *Deleted

## 2016-02-13 NOTE — Progress Notes (Signed)
Patient came by today with list of current medications. Added them to her list.

## 2016-02-17 ENCOUNTER — Other Ambulatory Visit: Payer: Self-pay | Admitting: Internal Medicine

## 2016-02-17 DIAGNOSIS — Z1231 Encounter for screening mammogram for malignant neoplasm of breast: Secondary | ICD-10-CM

## 2016-03-01 ENCOUNTER — Ambulatory Visit
Admission: RE | Admit: 2016-03-01 | Discharge: 2016-03-01 | Disposition: A | Discharge status: Home / Self Care | Payer: Medicare Other | Source: Ambulatory Visit | Attending: Internal Medicine | Admitting: Internal Medicine

## 2016-03-01 ENCOUNTER — Other Ambulatory Visit: Payer: Self-pay | Admitting: Internal Medicine

## 2016-03-01 DIAGNOSIS — Z1231 Encounter for screening mammogram for malignant neoplasm of breast: Secondary | ICD-10-CM | POA: Insufficient documentation

## 2016-03-17 ENCOUNTER — Encounter: Payer: Medicare Other | Attending: Internal Medicine | Admitting: Dietician

## 2016-03-17 ENCOUNTER — Encounter: Payer: Self-pay | Admitting: Dietician

## 2016-03-17 VITALS — BP 116/58 | Ht 66.0 in | Wt 193.8 lb

## 2016-03-17 DIAGNOSIS — E119 Type 2 diabetes mellitus without complications: Secondary | ICD-10-CM | POA: Diagnosis not present

## 2016-03-17 NOTE — Patient Instructions (Signed)
   Keep good control of carbohydrates in your meals: aim for 2-3 servings total with each meal, or 30-45grams carbohydrate.   Choose snacks with 15-20 grams carbohydrate, or 1 serving.   Start some exercise on a regular basis; start with short duration and gradually increase time and frequency. Think about things you enjoy or something new you'd like to try.

## 2016-03-17 NOTE — Progress Notes (Signed)
Diabetes Self-Management Education  Visit Type:  Follow-up  Appt. Start Time: 1100 Appt. End Time: 1200  03/17/2016  [name], identified by name and date of birth, is a 75 y.o. female with a diagnosis of Diabetes:  .   ASSESSMENT  Blood pressure 116/58, height 5\' 6"  (1.676 m), weight 193 lb 12.8 oz (87.907 kg). Body mass index is 31.3 kg/(m^2).       Diabetes Self-Management Education - [id number]    Complications   How often do you check your blood sugar? 1-2 times/day   Fasting Blood glucose range (mg/dL) 70-129;130-179   Postprandial Blood glucose range (mg/dL) --  stopped testing after meals since BGs were usually in good ocntrol  at that time   Number of hypoglycemic episodes per month 0   Have you had a dilated eye exam in the past 12 months? Yes   Have you had a dental exam in the past 12 months? Yes   Are you checking your feet? Yes   How many days per week are you checking your feet? 4   Dietary Intake   Breakfast 3 meals and 2-3 snacks daily; not always at the same times.    Exercise   Exercise Type ADL's  multiple activities, including social activities   Patient Education   Nutrition management  Role of diet in the treatment of diabetes and the relationship between the three main macronutrients and blood glucose level;Food label reading, portion sizes and measuring food.;Meal options for control of blood glucose level and chronic complications.;Other (comment)  plate method meal planning   Physical activity and exercise  Role of exercise on diabetes management, blood pressure control and cardiac health.;Helped patient identify appropriate exercises in relation to his/her diabetes, diabetes complications and other health issue.   Monitoring Taught/discussed recording of test results and interpretation of SMBG.   Post-Education Assessment   Patient understands the diabetes disease and treatment process. Demonstrates understanding / competency   Patient  understands incorporating nutritional management into lifestyle. Demonstrates understanding / competency   Patient undertands incorporating physical activity into lifestyle. Needs Review  needs support   Patient understands using medications safely. Demonstrates understanding / competency   Patient understands monitoring blood glucose, interpreting and using results Demonstrates understanding / competency   Patient understands prevention, detection, and treatment of acute complications. Demonstrates understanding / competency   Patient understands prevention, detection, and treatment of chronic complications. Demonstrates understanding / competency   Patient understands how to develop strategies to address psychosocial issues. Needs Review  needs support   Patient understands how to develop strategies to promote health/change behavior. Demonstrates understanding / competency   Outcomes   Program Status Completed      Learning Objective:  Patient will have a greater understanding of diabetes self-management. Patient education plan is to attend individual and/or group sessions per assessed needs and concerns.   Plan:  Patient reports high level of stress in recent weeks; she has started meeting with a therapist for counseling.   Patient Instructions   Keep good control of carbohydrates in your meals: aim for 2-3 servings total with each meal, or 30-45grams carbohydrate.   Choose snacks with 15-20 grams carbohydrate, or 1 serving.   Start some exercise on a regular basis; start with short duration and gradually increase time and frequency. Think about things you enjoy or something new you'd like to try.     Expected Outcomes:  Demonstrated interest in learning. Expect positive outcomes  Education material provided:  Plate planner with food lists; Quick and Healthy Meal Ideas  If problems or questions, patient to contact team via:  Phone

## 2016-06-14 ENCOUNTER — Other Ambulatory Visit: Payer: Self-pay | Admitting: Internal Medicine

## 2016-06-14 DIAGNOSIS — R109 Unspecified abdominal pain: Secondary | ICD-10-CM

## 2016-06-22 ENCOUNTER — Ambulatory Visit
Admission: RE | Admit: 2016-06-22 | Discharge: 2016-06-22 | Disposition: A | Discharge status: Home / Self Care | Payer: Medicare Other | Source: Ambulatory Visit | Attending: Internal Medicine | Admitting: Internal Medicine

## 2016-06-22 DIAGNOSIS — R109 Unspecified abdominal pain: Secondary | ICD-10-CM | POA: Diagnosis present

## 2016-09-03 DIAGNOSIS — Z794 Long term (current) use of insulin: Secondary | ICD-10-CM | POA: Insufficient documentation

## 2016-09-29 ENCOUNTER — Emergency Department: Payer: Medicare Other

## 2016-09-29 ENCOUNTER — Emergency Department
Admission: EM | Admit: 2016-09-29 | Discharge: 2016-09-30 | Disposition: A | Discharge status: Home / Self Care | Payer: Medicare Other | Attending: Emergency Medicine | Admitting: Emergency Medicine

## 2016-09-29 ENCOUNTER — Encounter: Payer: Self-pay | Admitting: Emergency Medicine

## 2016-09-29 DIAGNOSIS — S022XXA Fracture of nasal bones, initial encounter for closed fracture: Secondary | ICD-10-CM | POA: Diagnosis not present

## 2016-09-29 DIAGNOSIS — S0083XA Contusion of other part of head, initial encounter: Secondary | ICD-10-CM

## 2016-09-29 DIAGNOSIS — W19XXXA Unspecified fall, initial encounter: Secondary | ICD-10-CM

## 2016-09-29 DIAGNOSIS — Y9389 Activity, other specified: Secondary | ICD-10-CM | POA: Insufficient documentation

## 2016-09-29 DIAGNOSIS — E119 Type 2 diabetes mellitus without complications: Secondary | ICD-10-CM | POA: Insufficient documentation

## 2016-09-29 DIAGNOSIS — W01198A Fall on same level from slipping, tripping and stumbling with subsequent striking against other object, initial encounter: Secondary | ICD-10-CM | POA: Insufficient documentation

## 2016-09-29 DIAGNOSIS — I10 Essential (primary) hypertension: Secondary | ICD-10-CM | POA: Diagnosis not present

## 2016-09-29 DIAGNOSIS — Z79899 Other long term (current) drug therapy: Secondary | ICD-10-CM | POA: Insufficient documentation

## 2016-09-29 DIAGNOSIS — Z794 Long term (current) use of insulin: Secondary | ICD-10-CM | POA: Insufficient documentation

## 2016-09-29 DIAGNOSIS — S0990XA Unspecified injury of head, initial encounter: Secondary | ICD-10-CM | POA: Diagnosis present

## 2016-09-29 DIAGNOSIS — Y92009 Unspecified place in unspecified non-institutional (private) residence as the place of occurrence of the external cause: Secondary | ICD-10-CM | POA: Diagnosis not present

## 2016-09-29 DIAGNOSIS — Y999 Unspecified external cause status: Secondary | ICD-10-CM | POA: Diagnosis not present

## 2016-09-29 MED ORDER — ACETAMINOPHEN 500 MG PO TABS
1000.0000 mg | ORAL_TABLET | Freq: Once | ORAL | Status: AC
  Administered 2016-09-29: 1000 mg via ORAL
  Filled 2016-09-29: qty 2

## 2016-09-29 MED ORDER — OXYMETAZOLINE HCL 0.05 % NA SOLN
1.0000 | Freq: Once | NASAL | Status: AC
  Administered 2016-09-29: 1 via NASAL
  Filled 2016-09-29: qty 15

## 2016-09-29 NOTE — ED Provider Notes (Signed)
[facility] Emergency Department Provider Note        Time seen: ----------------------------------------- 10:12 AM on 09/29/2016 -----------------------------------------    I have reviewed the triage vital signs and the nursing notes.   HISTORY  Chief Complaint Fall    HPI Kristen Guerra is a 76 y.o. female who presents to ER after she tripped and fell. Patient was putting away Christmas  decorationswhen the event occurred. Patient states she tripped over a box of decorations and hit her face primarily. She was complaining of left upper chest pain, her nose was bleeding and she was concerned about swelling on her forehead. She denies any recent illness or severe pain at this time. So sustained a skin tear to her left forearm.   Past Medical History:  Diagnosis Date  . Anxiety   . Depression   . Diabetes mellitus without complication ([place])   . GERD (gastroesophageal reflux disease)   . High cholesterol   . Hypertension   . PSVT (paroxysmal supraventricular tachycardia) ([place])   . Sleep apnea   . Stroke ([place])   . SUI (stress urinary incontinence, female)     There are no active problems to display for this patient.   Past Surgical History:  Procedure Laterality Date  . ABDOMINAL HYSTERECTOMY    . BREAST BIOPSY Right 1977   negative  . BREAST BIOPSY Right 1988   negative  . BREAST SURGERY    . CHOLECYSTECTOMY    . EYE SURGERY     cataract  . GALLBLADDER SURGERY      Allergies Amoxicillin; Lidocaine; Other; and Penicillins  Social History Social History  Substance Use Topics  . Smoking status: Never Smoker  . Smokeless tobacco: Never Used  . Alcohol use 0.0 oz/week     Comment: once a quarter    Review of Systems Constitutional: Negative for fever. ENT: Positive for facial soreness, nosebleed Cardiovascular: Negative for chest pain. Respiratory: Negative for shortness of breath. Gastrointestinal: Negative for abdominal  pain, vomiting and diarrhea. Genitourinary: Negative for dysuria. Musculoskeletal: Negative for back pain. Skin: Positive for skin tear Neurological: Negative for headaches, focal weakness or numbness.  10-point ROS otherwise negative.  ____________________________________________   PHYSICAL EXAM:  VITAL SIGNS: ED Triage Vitals  Enc Vitals Group     BP 09/29/16 1008 120/65     Pulse Rate 09/29/16 1008 (!) 58     Resp 09/29/16 1008 20     Temp 09/29/16 1008 98 F (36.7 C)     Temp Source 09/29/16 1008 Oral     SpO2 09/29/16 1008 99 %     Weight 09/29/16 1011 192 lb (87.1 kg)     Height 09/29/16 1011 5\' 5"  (1.651 m)     Head Circumference --      Peak Flow --      Pain Score 09/29/16 1012 7     Pain Loc --      Pain Edu? --      Excl. in [place]? --     Constitutional: Alert and oriented. Well appearing and in no distress. Eyes: Conjunctivae are normal. PERRL. Normal extraocular movements. ENT   Head: Normocephalic, mild tenderness around the nasal bridge and zygoma on the left   Nose: Blood is present in the nares bilaterally   Mouth/Throat: Mucous membranes are moist.   Neck: No stridor. Cardiovascular: Normal rate, regular rhythm. No murmurs, rubs, or gallops. Respiratory: Normal respiratory effort without tachypnea nor retractions. Breath sounds are clear and  equal bilaterally. No wheezes/rales/rhonchi. Gastrointestinal: Soft and nontender. Normal bowel sounds Musculoskeletal: Nontender with normal range of motion in all extremities. No lower extremity tenderness nor edema. Neurologic:  Normal speech and language. No gross focal neurologic deficits are appreciated.  Skin:  Skin is warm, dry with a small skin tear of the left forearm dorsally Psychiatric: Mood and affect are normal. Speech and behavior are normal.  ____________________________________________  ED COURSE:  Pertinent labs & imaging results that were available during my care of the patient were  reviewed by me and considered in my medical decision making (see chart for details). Patient presents to ER for mechanical fall and minor head injury with mild epistaxis and skin tear. We will assess with basic imaging, she'll receive aspirin and Tylenol.   Procedures ____________________________________________   RADIOLOGY Images were viewed by me  CT head, maxillofacial Chest x-ray IMPRESSION: 1. No acute cardiopulmonary disease. No acute bony abnormality.  2. Bilateral carotid vascular disease .  IMPRESSION: 1. Nasal arch and septum fracture as described. 2. No evidence of intracranial injury. ____________________________________________  FINAL ASSESSMENT AND PLAN  Fall, minor head injury, nasal fracture, epistaxis  Plan: Patient with imaging as dictated above. Patient presents to ER after mechanical fall at home. Found to have nasal fracture and fractured nasal septum. Eating is under control this time. I advise using Afrin nasal spray and ice for the remainder of the day. She'll follow-up with ENT as an outpatient.   Earleen Newport, MD   Note: This note was generated in part or whole with voice recognition software. Voice recognition is usually quite accurate but there are transcription errors that can and very often do occur. I apologize for any typographical errors that were not detected and corrected.     Earleen Newport, MD 09/29/16 [phone]

## 2016-09-29 NOTE — ED Triage Notes (Signed)
Putting away Christmas decorations and tripped and fell.  Denies LOC. Superficial lac to nose, skin tear to left arm, abrasions to knees.  MAE

## 2017-02-27 ENCOUNTER — Emergency Department: Payer: Medicare Other

## 2017-02-27 ENCOUNTER — Encounter: Payer: Self-pay | Admitting: Internal Medicine

## 2017-02-27 ENCOUNTER — Observation Stay
Admission: EM | Admit: 2017-02-27 | Discharge: 2017-03-01 | Disposition: A | Discharge status: Home / Self Care | Payer: Medicare Other | Attending: Internal Medicine | Admitting: Internal Medicine

## 2017-02-27 DIAGNOSIS — E7800 Pure hypercholesterolemia, unspecified: Secondary | ICD-10-CM | POA: Insufficient documentation

## 2017-02-27 DIAGNOSIS — Z8673 Personal history of transient ischemic attack (TIA), and cerebral infarction without residual deficits: Secondary | ICD-10-CM | POA: Insufficient documentation

## 2017-02-27 DIAGNOSIS — I471 Supraventricular tachycardia: Secondary | ICD-10-CM | POA: Diagnosis not present

## 2017-02-27 DIAGNOSIS — N390 Urinary tract infection, site not specified: Secondary | ICD-10-CM

## 2017-02-27 DIAGNOSIS — R42 Dizziness and giddiness: Secondary | ICD-10-CM | POA: Diagnosis present

## 2017-02-27 DIAGNOSIS — Z794 Long term (current) use of insulin: Secondary | ICD-10-CM | POA: Diagnosis not present

## 2017-02-27 DIAGNOSIS — G4730 Sleep apnea, unspecified: Secondary | ICD-10-CM | POA: Insufficient documentation

## 2017-02-27 DIAGNOSIS — Z79899 Other long term (current) drug therapy: Secondary | ICD-10-CM | POA: Insufficient documentation

## 2017-02-27 DIAGNOSIS — E785 Hyperlipidemia, unspecified: Secondary | ICD-10-CM | POA: Diagnosis not present

## 2017-02-27 DIAGNOSIS — F329 Major depressive disorder, single episode, unspecified: Secondary | ICD-10-CM | POA: Insufficient documentation

## 2017-02-27 DIAGNOSIS — Z7902 Long term (current) use of antithrombotics/antiplatelets: Secondary | ICD-10-CM | POA: Diagnosis not present

## 2017-02-27 DIAGNOSIS — K219 Gastro-esophageal reflux disease without esophagitis: Secondary | ICD-10-CM | POA: Diagnosis present

## 2017-02-27 DIAGNOSIS — Z888 Allergy status to other drugs, medicaments and biological substances status: Secondary | ICD-10-CM | POA: Insufficient documentation

## 2017-02-27 DIAGNOSIS — I10 Essential (primary) hypertension: Secondary | ICD-10-CM | POA: Diagnosis not present

## 2017-02-27 DIAGNOSIS — I700 Atherosclerosis of aorta: Secondary | ICD-10-CM | POA: Diagnosis not present

## 2017-02-27 DIAGNOSIS — Z880 Allergy status to penicillin: Secondary | ICD-10-CM | POA: Insufficient documentation

## 2017-02-27 DIAGNOSIS — G9340 Encephalopathy, unspecified: Secondary | ICD-10-CM

## 2017-02-27 DIAGNOSIS — E119 Type 2 diabetes mellitus without complications: Secondary | ICD-10-CM | POA: Diagnosis not present

## 2017-02-27 DIAGNOSIS — R296 Repeated falls: Secondary | ICD-10-CM | POA: Insufficient documentation

## 2017-02-27 DIAGNOSIS — I638 Other cerebral infarction: Principal | ICD-10-CM | POA: Insufficient documentation

## 2017-02-27 DIAGNOSIS — F419 Anxiety disorder, unspecified: Secondary | ICD-10-CM | POA: Insufficient documentation

## 2017-02-27 LAB — CBC
HEMATOCRIT: 41.4 % (ref 35.0–47.0)
HEMOGLOBIN: 13.4 g/dL (ref 12.0–16.0)
MCH: 24.7 pg — ABNORMAL LOW (ref 26.0–34.0)
MCHC: 32.3 g/dL (ref 32.0–36.0)
MCV: 76.4 fL — ABNORMAL LOW (ref 80.0–100.0)
Platelets: 380 10*3/uL (ref 150–440)
RBC: 5.42 MIL/uL — AB (ref 3.80–5.20)
RDW: 18.7 % — ABNORMAL HIGH (ref 11.5–14.5)
WBC: 11.3 10*3/uL — ABNORMAL HIGH (ref 3.6–11.0)

## 2017-02-27 LAB — COMPREHENSIVE METABOLIC PANEL
ALBUMIN: 4 g/dL (ref 3.5–5.0)
ALT: 25 U/L (ref 14–54)
ANION GAP: 9 (ref 5–15)
AST: 25 U/L (ref 15–41)
Alkaline Phosphatase: 61 U/L (ref 38–126)
BILIRUBIN TOTAL: 0.8 mg/dL (ref 0.3–1.2)
BUN: 15 mg/dL (ref 6–20)
CO2: 24 mmol/L (ref 22–32)
Calcium: 9.6 mg/dL (ref 8.9–10.3)
Chloride: 102 mmol/L (ref 101–111)
Creatinine, Ser: 1.04 mg/dL — ABNORMAL HIGH (ref 0.44–1.00)
GFR calc non Af Amer: 51 mL/min — ABNORMAL LOW (ref >60)
GFR, EST AFRICAN AMERICAN: 59 mL/min — AB (ref >60)
GLUCOSE: 107 mg/dL — AB (ref 65–99)
POTASSIUM: 3.6 mmol/L (ref 3.5–5.1)
SODIUM: 135 mmol/L (ref 135–145)
TOTAL PROTEIN: 7.7 g/dL (ref 6.5–8.1)

## 2017-02-27 LAB — URINALYSIS, COMPLETE (UACMP) WITH MICROSCOPIC
BILIRUBIN URINE: NEGATIVE
GLUCOSE, UA: NEGATIVE mg/dL
HGB URINE DIPSTICK: NEGATIVE
KETONES UR: NEGATIVE mg/dL
Nitrite: NEGATIVE
PH: 5 (ref 5.0–8.0)
PROTEIN: NEGATIVE mg/dL
Specific Gravity, Urine: 1.018 (ref 1.005–1.030)

## 2017-02-27 LAB — TROPONIN I: Troponin I: <0.03 ng/mL (ref <0.03)

## 2017-02-27 MED ORDER — CEFTRIAXONE SODIUM 1 G IJ SOLR
1.0000 g | Freq: Once | INTRAMUSCULAR | Status: AC
  Administered 2017-02-27: 1 g via INTRAVENOUS
  Filled 2017-02-27: qty 10

## 2017-02-27 MED ORDER — SODIUM CHLORIDE 0.9 % IV BOLUS (SEPSIS)
1000.0000 mL | Freq: Once | INTRAVENOUS | Status: AC
  Administered 2017-02-27: 1000 mL via INTRAVENOUS

## 2017-02-27 NOTE — ED Triage Notes (Signed)
Arrives from [facility] clinic with c/o dizziness and progressive weakness x 1 week.  Has been treated with meclizine in the past.

## 2017-02-27 NOTE — ED Notes (Signed)
Pt returned to tx room at this time. Will administer meds at this time.

## 2017-02-27 NOTE — ED Triage Notes (Addendum)
Pt states that she fell last Monday, states that she has been having intermittent dizziness, went out of town after the fall and fell four more times, pt states seems to be having difficulty gathering her thoughts in triage and reports that has been going on for the past few years worsening over the past month, pt reports that she was seen here for dizziness 2 weeks ago, states that she saw her primary care this past week and reports that her blood sugars have been running a little higher than normal for her, today it was 187

## 2017-02-27 NOTE — ED Notes (Signed)
MD Robinson at bedside at this time.  

## 2017-02-27 NOTE — ED Notes (Signed)
Pt in MRI at this time with Colletta [place], EDT.

## 2017-02-27 NOTE — ED Provider Notes (Signed)
[facility] Emergency Department Provider Note    None    (approximate)  I have reviewed the triage vital signs and the nursing notes.   HISTORY  Chief Complaint Altered Mental Status    HPI Kristen Guerra is a 76 y.o. female presents from home with chief complaint of dizziness and weakness for one week.  He ascribes the dizziness as a abrupt unbalance that prevents her from being able to walk.  Patient was reportedly started on Cipro for UTI. States that she's having increasing falls at home.  Patient states she has a history of CVA and TIA but is normally able to function without any, condition. Denies any chest pain or shortness of breath no fevers.   Past Medical History:  Diagnosis Date  . Anxiety   . Depression   . Diabetes mellitus without complication ([place])   . GERD (gastroesophageal reflux disease)   . High cholesterol   . Hypertension   . PSVT (paroxysmal supraventricular tachycardia) ([place])   . Sleep apnea   . Stroke ([place])   . SUI (stress urinary incontinence, female)    Family History  Problem Relation Age of Onset  . Colon cancer Father    Past Surgical History:  Procedure Laterality Date  . ABDOMINAL HYSTERECTOMY    . BREAST BIOPSY Right 1977   negative  . BREAST BIOPSY Right 1988   negative  . BREAST SURGERY    . CHOLECYSTECTOMY    . EYE SURGERY     cataract  . GALLBLADDER SURGERY     Patient Active Problem List   Diagnosis Date Noted  . Dizziness 02/27/2017  . UTI (urinary tract infection) 02/27/2017  . GERD (gastroesophageal reflux disease) 02/27/2017  . HTN (hypertension) 02/27/2017  . HLD (hyperlipidemia) 02/27/2017  . Diabetes ([place]) 02/27/2017      Prior to Admission medications   Medication Sig Start Date End Date Taking? Authorizing Provider  atorvastatin (LIPITOR) 40 MG tablet Take 40 mg by mouth daily.   Yes [provider]  cholecalciferol (VITAMIN D) 1000 units tablet Take 1,000 Units by  mouth daily.    Yes [provider]  ciprofloxacin (CIPRO) 250 MG tablet Take 250 mg by mouth 2 (two) times daily. 02/21/17  Yes [provider]  clopidogrel (PLAVIX) 75 MG tablet Take 75 mg by mouth daily.  04/16/14  Yes [provider]  cyanocobalamin 1000 MCG tablet Take 1,000 mcg by mouth daily.    Yes [provider]  DULoxetine (CYMBALTA) 60 MG capsule Take 60 mg by mouth daily.  04/16/14  Yes [provider]  glimepiride (AMARYL) 4 MG tablet Take 2-4 mg by mouth 2 (two) times daily. 2 mg in the morning and 4 mg at night   Yes [provider]  hydrochlorothiazide (HYDRODIURIL) 25 MG tablet Take 25 mg by mouth daily.  04/16/14  Yes [provider]  insulin glargine (LANTUS) 100 unit/mL SOPN Inject 36 Units into the skin at bedtime.   Yes [provider]  JANUVIA 100 MG tablet Take 100 mg by mouth daily.  04/22/14  Yes [provider]  lamoTRIgine (LAMICTAL) 100 MG tablet Take 100 mg by mouth 2 (two) times daily.   Yes [provider]  Magnesium Oxide -Mg Supplement 400 MG CAPS Take 400 mg by mouth daily.    Yes [provider]  meclizine (ANTIVERT) 12.5 MG tablet Take 12.5 mg by mouth 3 (three) times daily as needed for dizziness.  Yes [provider]  metoprolol succinate (TOPROL-XL) 25 MG 24 hr tablet Take 25 mg by mouth daily.  04/16/14  Yes [provider]  mirabegron ER (MYRBETRIQ) 50 MG TB24 tablet Take 50 mg by mouth daily.   Yes [provider]  naproxen sodium (ANAPROX) 220 MG tablet Take 220 mg by mouth 2 (two) times daily as needed (pain). Reported on 02/13/2016   Yes [provider]  ONE TOUCH ULTRA TEST test strip  03/12/14  Yes [provider]  oxybutynin (DITROPAN-XL) 10 MG 24 hr tablet Take 10 mg by mouth at bedtime.   Yes [provider]  pantoprazole (PROTONIX) 40 MG tablet Take 40 mg by mouth daily.   Yes [provider]    verapamil (CALAN-SR) 180 MG CR tablet Take 360 mg by mouth daily.  04/16/14  Yes [provider]    Allergies Amoxicillin and Lidocaine    Social History Social History  Substance Use Topics  . Smoking status: Never Smoker  . Smokeless tobacco: Never Used  . Alcohol use 0.0 oz/week     Comment: once a quarter    Review of Systems Patient denies headaches, rhinorrhea, blurry vision, numbness, shortness of breath, chest pain, edema, cough, abdominal pain, nausea, vomiting, diarrhea, dysuria, fevers, rashes or hallucinations unless otherwise stated above in HPI. ____________________________________________   PHYSICAL EXAM:  VITAL SIGNS: Vitals:   02/27/17 2054 02/27/17 2100  BP: 137/74 (!) 143/69  Pulse: 90 83  Resp:  (!) 25  Temp:      Constitutional: Alert and oriented. Well appearing and in no acute distress. Eyes: Conjunctivae are normal.  Head: Atraumatic. Nose: No congestion/rhinnorhea. Mouth/Throat: Mucous membranes are moist.   Neck: No stridor. Painless ROM.  Cardiovascular: Normal rate, regular rhythm. Grossly normal heart sounds.  Good peripheral circulation. Respiratory: Normal respiratory effort.  No retractions. Lungs CTAB. Gastrointestinal: Soft and nontender. No distention. No abdominal bruits. No CVA tenderness. Musculoskeletal: No lower extremity tenderness nor edema.  No joint effusions. Neurologic:  Normal speech and language.  Normal fnf.  No gross focal neurologic deficits are appreciated. No facial droop Skin:  Skin is warm, dry and intact. No rash noted. Psychiatric: Mood and affect are normal. Speech and behavior are normal.  ____________________________________________   LABS (all labs ordered are listed, but only abnormal results are displayed)  Results for orders placed or performed during the hospital encounter of 02/27/17 (from the past 24 hour(s))  Comprehensive metabolic panel     Status: Abnormal   Collection Time: 02/27/17   3:34 PM  Result Value Ref Range   Sodium 135 135 - 145 mmol/L   Potassium 3.6 3.5 - 5.1 mmol/L   Chloride 102 101 - 111 mmol/L   CO2 24 22 - 32 mmol/L   Glucose, Bld 107 (H) 65 - 99 mg/dL   BUN 15 6 - 20 mg/dL   Creatinine, Ser 1.04 (H) 0.44 - 1.00 mg/dL   Calcium 9.6 8.9 - 10.3 mg/dL   Total Protein 7.7 6.5 - 8.1 g/dL   Albumin 4.0 3.5 - 5.0 g/dL   AST 25 15 - 41 U/L   ALT 25 14 - 54 U/L   Alkaline Phosphatase 61 38 - 126 U/L   Total Bilirubin 0.8 0.3 - 1.2 mg/dL   GFR calc non Af Amer 51 (L) >60 mL/min   GFR calc Af Amer 59 (L) >60 mL/min   Anion gap 9 5 - 15  CBC     Status: Abnormal  Collection Time: 02/27/17  3:34 PM  Result Value Ref Range   WBC 11.3 (H) 3.6 - 11.0 K/uL   RBC 5.42 (H) 3.80 - 5.20 MIL/uL   Hemoglobin 13.4 12.0 - 16.0 g/dL   HCT 41.4 35.0 - 47.0 %   MCV 76.4 (L) 80.0 - 100.0 fL   MCH 24.7 (L) 26.0 - 34.0 pg   MCHC 32.3 32.0 - 36.0 g/dL   RDW 18.7 (H) 11.5 - 14.5 %   Platelets 380 150 - 440 K/uL  Troponin I     Status: None   Collection Time: 02/27/17  3:34 PM  Result Value Ref Range   Troponin I <0.03 <0.03 ng/mL  Urinalysis, Complete w Microscopic     Status: Abnormal   Collection Time: 02/27/17  9:34 PM  Result Value Ref Range   Color, Urine YELLOW (A) YELLOW   APPearance CLOUDY (A) CLEAR   Specific Gravity, Urine 1.018 1.005 - 1.030   pH 5.0 5.0 - 8.0   Glucose, UA NEGATIVE NEGATIVE mg/dL   Hgb urine dipstick NEGATIVE NEGATIVE   Bilirubin Urine NEGATIVE NEGATIVE   Ketones, ur NEGATIVE NEGATIVE mg/dL   Protein, ur NEGATIVE NEGATIVE mg/dL   Nitrite NEGATIVE NEGATIVE   Leukocytes, UA LARGE (A) NEGATIVE   RBC / HPF 6-30 0 - 5 RBC/hpf   WBC, UA TOO NUMEROUS TO COUNT 0 - 5 WBC/hpf   Bacteria, UA FEW (A) NONE SEEN   Squamous Epithelial / LPF 6-30 (A) NONE SEEN   Mucous PRESENT    ____________________________________________  EKG My review and personal interpretation at Time: 15:35   Indication: dizziness  Rate: 100  Rhythm: sinus Axis:  normal Other: normal intervals, no stemi ____________________________________________  RADIOLOGY  I personally reviewed all radiographic images ordered to evaluate for the above acute complaints and reviewed radiology reports and findings.  These findings were personally discussed with the patient.  Please see medical record for radiology report.  ____________________________________________   PROCEDURES  Procedure(s) performed:  Procedures    Critical Care performed: no ____________________________________________   INITIAL IMPRESSION / ASSESSMENT AND PLAN / ED COURSE  Pertinent labs & imaging results that were available during my care of the patient were reviewed by me and considered in my medical decision making (see chart for details).  DDX: uti, cva, electrolyte abn, dehydration, sepsis  Kristen Guerra is a 76 y.o. who presents to the ED with Chief complaint as described above. Patient does have some unsteady gait. CT imaging ordered to evaluate for CVA or bleed shows no acute ischemia. Blood work does show mild leukocytosis. MRI will be ordered to evaluate for any evidence of acute ischemia given her high-risk profile and history of stroke.  Urine does show evidence of persistent infection despite antibody treatment. Patient will require admission for Continued UTI. We'll give IV Rocephin.  Have discussed with the patient and available family all diagnostics and treatments performed thus far and all questions were answered to the best of my ability. The patient demonstrates understanding and agreement with plan.     ____________________________________________   FINAL CLINICAL IMPRESSION(S) / ED DIAGNOSES  Final diagnoses:  Acute encephalopathy  Complicated UTI (urinary tract infection)      NEW MEDICATIONS STARTED DURING THIS VISIT:  New Prescriptions   No medications on file     Note:  This document was prepared using Dragon voice recognition software  and may include unintentional dictation errors.    Merlyn Lot, MD 02/27/17 [phone]

## 2017-02-27 NOTE — ED Notes (Addendum)
Pt instructed for need for urine sample.. Pt states currently on antibiotics for UTI dx at PCP. Only taken 2 doses to date

## 2017-02-28 ENCOUNTER — Observation Stay: Payer: Medicare Other

## 2017-02-28 ENCOUNTER — Observation Stay
Admit: 2017-02-28 | Discharge: 2017-02-28 | Disposition: A | Discharge status: Home / Self Care | Payer: Medicare Other | Attending: Internal Medicine | Admitting: Internal Medicine

## 2017-02-28 DIAGNOSIS — R42 Dizziness and giddiness: Secondary | ICD-10-CM | POA: Diagnosis not present

## 2017-02-28 LAB — LIPID PANEL
Cholesterol: 162 mg/dL (ref 0–200)
HDL: 31 mg/dL — ABNORMAL LOW (ref >40)
LDL CALC: 110 mg/dL — AB (ref 0–99)
Total CHOL/HDL Ratio: 5.2 RATIO
Triglycerides: 107 mg/dL (ref <150)
VLDL: 21 mg/dL (ref 0–40)

## 2017-02-28 LAB — BASIC METABOLIC PANEL
ANION GAP: 8 (ref 5–15)
BUN: 12 mg/dL (ref 6–20)
CO2: 24 mmol/L (ref 22–32)
Calcium: 8.8 mg/dL — ABNORMAL LOW (ref 8.9–10.3)
Chloride: 102 mmol/L (ref 101–111)
Creatinine, Ser: 0.86 mg/dL (ref 0.44–1.00)
GFR calc Af Amer: >60 mL/min (ref >60)
GFR calc non Af Amer: >60 mL/min (ref >60)
Glucose, Bld: 157 mg/dL — ABNORMAL HIGH (ref 65–99)
POTASSIUM: 3.2 mmol/L — AB (ref 3.5–5.1)
Sodium: 134 mmol/L — ABNORMAL LOW (ref 135–145)

## 2017-02-28 LAB — FOLATE: FOLATE: 13.5 ng/mL (ref >5.9)

## 2017-02-28 LAB — CBC
HEMATOCRIT: 35.6 % (ref 35.0–47.0)
HEMOGLOBIN: 11.6 g/dL — AB (ref 12.0–16.0)
MCH: 24.7 pg — AB (ref 26.0–34.0)
MCHC: 32.6 g/dL (ref 32.0–36.0)
MCV: 75.8 fL — AB (ref 80.0–100.0)
Platelets: 292 10*3/uL (ref 150–440)
RBC: 4.7 MIL/uL (ref 3.80–5.20)
RDW: 18.2 % — AB (ref 11.5–14.5)
WBC: 8.8 10*3/uL (ref 3.6–11.0)

## 2017-02-28 LAB — TSH: TSH: 1.48 u[IU]/mL (ref 0.350–4.500)

## 2017-02-28 LAB — GLUCOSE, CAPILLARY
GLUCOSE-CAPILLARY: 102 mg/dL — AB (ref 65–99)
GLUCOSE-CAPILLARY: 136 mg/dL — AB (ref 65–99)
GLUCOSE-CAPILLARY: 154 mg/dL — AB (ref 65–99)
Glucose-Capillary: 169 mg/dL — ABNORMAL HIGH (ref 65–99)
Glucose-Capillary: 204 mg/dL — ABNORMAL HIGH (ref 65–99)
Glucose-Capillary: 238 mg/dL — ABNORMAL HIGH (ref 65–99)

## 2017-02-28 MED ORDER — CLOPIDOGREL BISULFATE 75 MG PO TABS
75.0000 mg | ORAL_TABLET | Freq: Every day | ORAL | Status: DC
  Administered 2017-02-28 – 2017-03-01 (×2): 75 mg via ORAL
  Filled 2017-02-28 (×2): qty 1

## 2017-02-28 MED ORDER — INSULIN ASPART 100 UNIT/ML ~~LOC~~ SOLN
0.0000 [IU] | Freq: Every day | SUBCUTANEOUS | Status: DC
  Administered 2017-02-28: 21:00:00 2 [IU] via SUBCUTANEOUS
  Filled 2017-02-28: qty 1

## 2017-02-28 MED ORDER — ONDANSETRON HCL 4 MG/2ML IJ SOLN
4.0000 mg | Freq: Four times a day (QID) | INTRAMUSCULAR | Status: DC | PRN
Start: 2017-02-28 — End: 2017-03-01

## 2017-02-28 MED ORDER — ONDANSETRON HCL 4 MG PO TABS: 4.0000 mg | ORAL_TABLET | Freq: Four times a day (QID) | ORAL | Status: DC | PRN

## 2017-02-28 MED ORDER — ACETAMINOPHEN 650 MG RE SUPP: 650.0000 mg | Freq: Four times a day (QID) | RECTAL | Status: DC | PRN

## 2017-02-28 MED ORDER — ATORVASTATIN CALCIUM 20 MG PO TABS
40.0000 mg | ORAL_TABLET | Freq: Every day | ORAL | Status: DC
  Administered 2017-02-28 – 2017-03-01 (×2): 40 mg via ORAL
  Filled 2017-02-28 (×2): qty 2

## 2017-02-28 MED ORDER — STROKE: EARLY STAGES OF RECOVERY BOOK
Freq: Once | Status: AC
  Administered 2017-02-28: 03:00:00

## 2017-02-28 MED ORDER — PANTOPRAZOLE SODIUM 40 MG PO TBEC
40.0000 mg | DELAYED_RELEASE_TABLET | Freq: Every day | ORAL | Status: DC
  Administered 2017-02-28 – 2017-03-01 (×2): 40 mg via ORAL
  Filled 2017-02-28 (×2): qty 1

## 2017-02-28 MED ORDER — DEXTROSE 5 % IV SOLN
1.0000 g | INTRAVENOUS | Status: DC
  Administered 2017-02-28: 1 g via INTRAVENOUS
  Filled 2017-02-28 (×2): qty 10

## 2017-02-28 MED ORDER — ENOXAPARIN SODIUM 40 MG/0.4ML ~~LOC~~ SOLN
40.0000 mg | SUBCUTANEOUS | Status: DC
  Administered 2017-02-28: 40 mg via SUBCUTANEOUS
  Filled 2017-02-28: qty 0.4

## 2017-02-28 MED ORDER — LAMOTRIGINE 100 MG PO TABS
100.0000 mg | ORAL_TABLET | Freq: Two times a day (BID) | ORAL | Status: DC
  Administered 2017-02-28 – 2017-03-01 (×4): 100 mg via ORAL
  Filled 2017-02-28 (×4): qty 1

## 2017-02-28 MED ORDER — POTASSIUM CHLORIDE CRYS ER 20 MEQ PO TBCR
40.0000 meq | EXTENDED_RELEASE_TABLET | Freq: Once | ORAL | Status: AC
  Administered 2017-02-28: 40 meq via ORAL
  Filled 2017-02-28: qty 2

## 2017-02-28 MED ORDER — ACETAMINOPHEN 325 MG PO TABS
650.0000 mg | ORAL_TABLET | Freq: Four times a day (QID) | ORAL | Status: DC | PRN
  Administered 2017-02-28 – 2017-03-01 (×2): 650 mg via ORAL
  Filled 2017-02-28 (×2): qty 2

## 2017-02-28 MED ORDER — DULOXETINE HCL 30 MG PO CPEP
60.0000 mg | ORAL_CAPSULE | Freq: Every day | ORAL | Status: DC
  Administered 2017-02-28 – 2017-03-01 (×2): 60 mg via ORAL
  Filled 2017-02-28 (×2): qty 2

## 2017-02-28 MED ORDER — INSULIN ASPART 100 UNIT/ML ~~LOC~~ SOLN
0.0000 [IU] | Freq: Three times a day (TID) | SUBCUTANEOUS | Status: DC
  Administered 2017-02-28: 1 [IU] via SUBCUTANEOUS
  Administered 2017-02-28: 08:00:00 2 [IU] via SUBCUTANEOUS
  Administered 2017-02-28: 13:00:00 3 [IU] via SUBCUTANEOUS
  Administered 2017-03-01 (×2): 1 [IU] via SUBCUTANEOUS
  Filled 2017-02-28 (×5): qty 1

## 2017-02-28 NOTE — Progress Notes (Signed)
Speech Therapy Note: reviewed chart notes; consulted NSG re: pt's status. Met w/ pt who had just finished w/ OT eval and having visitors; pt was resting in bed.  Discussed pt's swallowing and speech-language abilities - pt denied any swallowing deficits. She endorsed intermittent word-finding "sometimes". Pt appeared moderately distracted and topic maintenance was reduced. Suggested ST services f/u w/ pt's status in morning for further assessment and encouraged pt to slow down when talking; pt will rest this afternoon b/f dinner meal. NSG updated.    Orinda Kenner, [place], CCC-SLP

## 2017-02-28 NOTE — Evaluation (Signed)
Physical Therapy Evaluation Patient Details Name: Kristen Guerra MRN: [id number] DOB: [birth year] Today's Date: 02/28/2017   History of Present Illness  Kristen Guerra is a 76yo white female who comes to [facility] on 7/1 after worsing dizziness and AMS. Pt admitted for UTI, but MRI showing acute to subacute infarct in Rt Frontal Lobe. PMH:  GAD, DM, HT, PSVT, OSA, CVA. Pt reports over the past 3-4 weeks, she has been falling quite a bit, but also describes what sounds like orthostatic hypotension with standing that resolves while she stands and waits prior to AMB. She also attests falls to feeling generally more sluggish and weak motorically.   Clinical Impression  Pt admitted with above diagnosis. Pt currently with functional limitations due to the deficits listed below (see "PT Problem List"). Upon entry, the patient is received semirecumbent in bed, no family/caregiver present. The pt is awake and agreeable to participate. No acute distress noted at this time. Pt describes an instance of dizziness earlier this morning during supine-to-sitting EBO which lasted less than 60 seconds and resolved fully, she reports this as similar to prior episodes PTA, but different from dizziness with standing at home. The pt is alert and oriented x4, but requires three attempts for the correct year (1988, 2088, 2018), and she also has 3 instances of word finding difficulty during interview. Pt follows simple commands consistently. Visual perception is screened with MoCA B 8-image cluster, visualizing 3 of 8 images, and she correctly names 4 animals. Orthostatic vitals are taken and WNL. She has dizziness in session only after turning in place (appears mildly ataxic) and retroAMB. AMB in hallway is with persistent left drifting, which the patient identifies but is unable to rectify despite efforts. MMT/light-touch sensation are intact and 5/5. Pt is positive for mild Left sided dysmetria. Functional mobility assessment  demonstrates mild-moderate weakness during transfers and gait consistent with presentation over the past 3-4 weeks per patient report. The patient is at high risk for falls as evidence by gait speed <1.9m/s, forward reach <5", and multiple LOB demonstrated throughout session. Pt will benefit from skilled PT intervention to increase independence and safety with basic mobility in preparation for discharge to the venue listed below.       Follow Up Recommendations Home health PT;Supervision for mobility/OOB    Equipment Recommendations  Rolling walker with 5" wheels    Recommendations for Other Services       Precautions / Restrictions Precautions Precautions: None Restrictions Weight Bearing Restrictions: No      Mobility  Bed Mobility Overal bed mobility: Needs Assistance Bed Mobility: Supine to Sit     Supine to sit: Mod assist     General bed mobility comments: [place] weakness, per baseline  Transfers Overall transfer level: Needs assistance Equipment used: None Transfers: Sit to/from Stand Sit to Stand: Supervision         General transfer comment: performed 5x from chair with armrests   Ambulation/Gait Ambulation/Gait assistance: Min assist Ambulation Distance (Feet): 200 Feet Assistive device: Rolling walker (2 wheeled) Gait Pattern/deviations: Step-through pattern;Drifts right/left Gait velocity: 0.102m/s  Gait velocity interpretation: <1.8 ft/sec, indicative of risk for recurrent falls General Gait Details: Drifting left, is aware, but unable to correct. Mild difficulty navigating obstacles on left.   Stairs            Wheelchair Mobility    Modified Rankin (Stroke Patients Only)       Balance Overall balance assessment: History of Falls;Needs assistance  High level balance activites: Turns;Backward walking High Level Balance Comments: mild ataxia c turning in place, R>L and unsteadiness. Uncontrolled  retropulsion with retrogait.              Pertinent Vitals/Pain Pain Assessment: No/denies pain    Home Living Family/patient expects to be discharged to:: Private residence Living Arrangements: Spouse/significant other Available Help at Discharge: Family Type of Home: House Home Access: Stairs to enter Entrance Stairs-Rails: Can reach both Entrance Stairs-Number of Steps: 3 Home Layout: Two level;Able to live on main level with bedroom/bathroom Home Equipment: Shower seat;Cane - single point      Prior Function Level of Independence: Needs assistance   Gait / Transfers Assistance Needed: 4WA, limited community distances S AD limited by 'being old and getting tired'; recently unsteady and needing SPC for stability   ADL's / Homemaking Assistance Needed: sometimes needs husband to assist out of bed or out of bathtub         Hand Dominance   Dominant Hand: Right    Extremity/Trunk Assessment   Upper Extremity Assessment Upper Extremity Assessment: Overall WFL for tasks assessed (MMT screened 5/5, pain free, sensation intact. Lt dysmetria and missed finger to nose the first trial. )    Lower Extremity Assessment Lower Extremity Assessment: Overall WFL for tasks assessed (MMT screened 5/5, painfree, and sensation intact. )    Cervical / Trunk Assessment Cervical / Trunk Assessment:  (Mild retropulsion while seated EOB, self corrected. )  Communication   Communication: No difficulties;Expressive difficulties (3 instances of word finding problems within session, she reports as baseline. )  Cognition Arousal/Alertness: Awake/alert Behavior During Therapy: WFL for tasks assessed/performed Overall Cognitive Status: Impaired/Different from baseline Area of Impairment: Orientation                 Orientation Level: Person;Place;Time;Situation (Gives June 28th, then 1988, 2088, 2018.)             General Comments: Can visualize 3 images from 8 image cluster  from MoCA-B; Names 4 animals correctly from [place]     Assessment/Plan    PT Assessment Patient needs continued PT services  PT Problem List Decreased balance;Decreased coordination;Decreased cognition       PT Treatment Interventions DME instruction;Gait training;Functional mobility training;Therapeutic activities;Balance training;Cognitive remediation    PT Goals (Current goals can be found in the Care Plan section)  Acute Rehab PT Goals Patient Stated Goal: Decrease falls, improve safety  PT Goal Formulation: With patient Time For Goal Achievement: 03/14/17 Potential to Achieve Goals: Fair    Frequency 7X/week   Barriers to discharge        Co-evaluation               AM-PAC PT "6 Clicks" Daily Activity  Outcome Measure Difficulty turning over in bed (including adjusting bedclothes, sheets and blankets)?: None Difficulty moving from lying on back to sitting on the side of the bed? : Total Difficulty sitting down on and standing up from a chair with arms (e.g., wheelchair, bedside commode, etc,.)?: A Little Help needed moving to and from a bed to chair (including a wheelchair)?: A Lot Help needed walking in hospital room?: A Lot Help needed climbing 3-5 steps with a railing? : A Lot 6 Click Score: 14    End of Session Equipment Utilized During Treatment: Gait belt Activity Tolerance: Patient tolerated treatment well Patient left: in bed;with call bell/phone  within reach;Other (comment) (EOB eating breakfast ) Nurse Communication: Other (comment) (ortho vitals taken ) PT Visit Diagnosis: Unsteadiness on feet (R26.81);Other abnormalities of gait and mobility (R26.89);Repeated falls (R29.6)    Time: 1031-5945 PT Time Calculation (min) (ACUTE ONLY): 40 min   Charges:   PT Evaluation $PT Eval Moderate Complexity: 1 Procedure PT Treatments $Therapeutic Activity: 8-22 mins   PT G Codes:   PT G-Codes **NOT FOR  INPATIENT CLASS** Functional Assessment Tool Used: AM-PAC 6 Clicks Basic Mobility;Clinical judgement Functional Limitation: Mobility: Walking and moving around Mobility: Walking and Moving Around Current Status (O5929): At least 40 percent but less than 60 percent impaired, limited or restricted Mobility: Walking and Moving Around Goal Status [phone]): At least 40 percent but less than 60 percent impaired, limited or restricted    10:47 AM, 02/28/17 Etta Grandchild, PT, DPT Physical Therapist - [facility])  [phone] (mobile)   Kiernan Atkerson C 02/28/2017, 10:37 AM

## 2017-02-28 NOTE — ED Notes (Signed)
MD Willis at bedside. 

## 2017-02-28 NOTE — ED Notes (Signed)
CBG 102 

## 2017-02-28 NOTE — ED Notes (Signed)
Report to Mariane Masters, Therapist, sports. Request admission orders Stroke swallow screen and NIH completed prior to transport to floor. This RN to complete at this time.

## 2017-02-28 NOTE — Progress Notes (Deleted)
Speech Therapy Note: reviewed chart notes; consulted NSG re: pt's status. Met w/ pt who had just finished w/ OT eval and having visitors; pt was resting in bed.  Discussed pt's swallowing and speech-language abilities - pt denied any swallowing deficits. She endorsed intermittent word-finding "sometimes". Pt appeared moderately distracted and topic maintenance was reduced. Suggested ST services f/u w/ pt's status in morning for further assessment and encouraged pt to slow down when talking; pt will rest this afternoon b/f dinner meal. NSG updated.    Arine Amberley Hamler, MS, CCC-SLP  

## 2017-02-28 NOTE — Care Management (Signed)
Admitted to this facility under observation status with the diagnosis of urinary tract infection. Lives with Emiliano Dyer [phone]). Last seen Dr. Doy Hutching 02/03/17. Prescriptions are filled at [facility]. No home Health. No skilled facility. No home oxygen. Cane and rollayor in the home. Takes care of all basic activities of daily living herself, not driving until she feels better. Last fall was 2 weeks ago. Not a great appetite.  Family will transport Physical therapy evaluation completed. Recommending home with home heath physical therapy/supervision. Discussed home health agencies. [place]. Will update Floydene Flock, [facility] representative.  Discharge 03/01/17 per Dr. Edwina Barth. Shelbie Ammons RN MSN CCM Care Management [phone]

## 2017-02-28 NOTE — H&P (Signed)
[facility] NAME: Caedyn Tassinari    MR#:  [id number]  DATE OF BIRTH:  [birth year]  DATE OF ADMISSION:  02/27/2017  PRIMARY CARE PHYSICIAN: Idelle Crouch, MD   REQUESTING/REFERRING PHYSICIAN: Quentin Cornwall, MD  CHIEF COMPLAINT:   Chief Complaint  Patient presents with  . Altered Mental Status    HISTORY OF PRESENT ILLNESS:  Donna Snooks  is a 76 y.o. female who presents with Confusion and dizziness. Patient states that she's been falling for the last several weeks, and that she has been dizzy for the last day or so. Here in the ED she was found to have likely UTI. However, given her symptoms there is some concern for possible stroke as well, and MRI was ordered. Hospitalists were called for admission.   PAST MEDICAL HISTORY:   Past Medical History:  Diagnosis Date  . Anxiety   . Depression   . Diabetes mellitus without complication ([place])   . GERD (gastroesophageal reflux disease)   . High cholesterol   . Hypertension   . PSVT (paroxysmal supraventricular tachycardia) ([place])   . Sleep apnea   . Stroke ([place])   . SUI (stress urinary incontinence, female)     PAST SURGICAL HISTORY:   Past Surgical History:  Procedure Laterality Date  . ABDOMINAL HYSTERECTOMY    . BREAST BIOPSY Right 1977   negative  . BREAST BIOPSY Right 1988   negative  . BREAST SURGERY    . CHOLECYSTECTOMY    . EYE SURGERY     cataract  . GALLBLADDER SURGERY      SOCIAL HISTORY:   Social History  Substance Use Topics  . Smoking status: Never Smoker  . Smokeless tobacco: Never Used  . Alcohol use 0.0 oz/week     Comment: once a quarter    FAMILY HISTORY:   Family History  Problem Relation Age of Onset  . Colon cancer Father     DRUG ALLERGIES:   Allergies  Allergen Reactions  . Amoxicillin Itching and Other (See Comments)    Reaction: dizziness Has patient had a PCN reaction causing immediate rash,  facial/tongue/throat swelling, SOB or lightheadedness with hypotension: No Has patient had a PCN reaction causing severe rash involving mucus membranes or skin necrosis: No Has patient had a PCN reaction that required hospitalization: No Has patient had a PCN reaction occurring within the last 10 years: Yes If all of the above answers are "NO", then may proceed with Cephalosporin use.   . Lidocaine Itching    MEDICATIONS AT HOME:   Prior to Admission medications   Medication Sig Start Date End Date Taking? Authorizing Provider  atorvastatin (LIPITOR) 40 MG tablet Take 40 mg by mouth daily.   Yes [provider]  cholecalciferol (VITAMIN D) 1000 units tablet Take 1,000 Units by mouth daily.    Yes [provider]  ciprofloxacin (CIPRO) 250 MG tablet Take 250 mg by mouth 2 (two) times daily. 02/21/17  Yes [provider]  clopidogrel (PLAVIX) 75 MG tablet Take 75 mg by mouth daily.  04/16/14  Yes [provider]  cyanocobalamin 1000 MCG tablet Take 1,000 mcg by mouth daily.    Yes [provider]  DULoxetine (CYMBALTA) 60 MG capsule Take 60 mg by mouth daily.  04/16/14  Yes [provider]  glimepiride (AMARYL) 4 MG tablet Take 2-4 mg by mouth 2 (two) times daily. 2 mg in the morning and 4 mg at night  Yes [provider]  hydrochlorothiazide (HYDRODIURIL) 25 MG tablet Take 25 mg by mouth daily.  04/16/14  Yes [provider]  insulin glargine (LANTUS) 100 unit/mL SOPN Inject 36 Units into the skin at bedtime.   Yes [provider]  JANUVIA 100 MG tablet Take 100 mg by mouth daily.  04/22/14  Yes [provider]  lamoTRIgine (LAMICTAL) 100 MG tablet Take 100 mg by mouth 2 (two) times daily.   Yes [provider]  Magnesium Oxide -Mg Supplement 400 MG CAPS Take 400 mg by mouth daily.    Yes [provider]  meclizine (ANTIVERT) 12.5 MG tablet Take 12.5 mg by mouth 3 (three) times daily as  needed for dizziness.   Yes [provider]  metoprolol succinate (TOPROL-XL) 25 MG 24 hr tablet Take 25 mg by mouth daily.  04/16/14  Yes [provider]  mirabegron ER (MYRBETRIQ) 50 MG TB24 tablet Take 50 mg by mouth daily.   Yes [provider]  naproxen sodium (ANAPROX) 220 MG tablet Take 220 mg by mouth 2 (two) times daily as needed (pain). Reported on 02/13/2016   Yes [provider]  ONE TOUCH ULTRA TEST test strip  03/12/14  Yes [provider]  oxybutynin (DITROPAN-XL) 10 MG 24 hr tablet Take 10 mg by mouth at bedtime.   Yes [provider]  pantoprazole (PROTONIX) 40 MG tablet Take 40 mg by mouth daily.   Yes [provider]  verapamil (CALAN-SR) 180 MG CR tablet Take 360 mg by mouth daily.  04/16/14  Yes [provider]    REVIEW OF SYSTEMS:  Review of Systems  Constitutional: Negative for chills, fever, malaise/fatigue and weight loss.  HENT: Negative for ear pain, hearing loss and tinnitus.   Eyes: Negative for blurred vision, double vision, pain and redness.  Respiratory: Negative for cough, hemoptysis and shortness of breath.   Cardiovascular: Negative for chest pain, palpitations, orthopnea and leg swelling.  Gastrointestinal: Negative for abdominal pain, constipation, diarrhea, nausea and vomiting.  Genitourinary: Positive for frequency. Negative for dysuria and hematuria.  Musculoskeletal: Negative for back pain, joint pain and neck pain.  Skin:       No acne, rash, or lesions  Neurological: Positive for dizziness. Negative for tremors, focal weakness and weakness.  Endo/Heme/Allergies: Negative for polydipsia. Does not bruise/bleed easily.  Psychiatric/Behavioral: Negative for depression. The patient is not nervous/anxious and does not have insomnia.      VITAL SIGNS:   Vitals:   02/27/17 1532 02/27/17 1533 02/27/17 2054 02/27/17 2100  BP: (!) 141/80  137/74 (!) 143/69  Pulse: (!) 103  90 83   Resp: 18   (!) 25  Temp: 98.6 F (37 C)     TempSrc: Oral     SpO2: 97%  96% 96%  Weight:  86.2 kg (190 lb)    Height:  5\' 6"  (1.676 m)     Wt Readings from Last 3 Encounters:  02/27/17 86.2 kg (190 lb)  09/29/16 87.1 kg (192 lb)  03/17/16 87.9 kg (193 lb 12.8 oz)    PHYSICAL EXAMINATION:  Physical Exam  Vitals reviewed. Constitutional: She is oriented to person, place, and time. She appears well-developed and well-nourished. No distress.  HENT:  Head: Normocephalic and atraumatic.  Mouth/Throat: Oropharynx is clear and moist.  Eyes: Conjunctivae and EOM are normal. Pupils are equal, round, and reactive to light. No scleral icterus.  Neck: Normal range of motion. Neck supple. No JVD present. No thyromegaly  present.  Cardiovascular: Normal rate, regular rhythm and intact distal pulses.  Exam reveals no gallop and no friction rub.   No murmur heard. Respiratory: Effort normal and breath sounds normal. No respiratory distress. She has no wheezes. She has no rales.  GI: Soft. Bowel sounds are normal. She exhibits no distension. There is no tenderness.  Musculoskeletal: Normal range of motion. She exhibits no edema.  No arthritis, no gout  Lymphadenopathy:    She has no cervical adenopathy.  Neurological: She is alert and oriented to person, place, and time. No cranial nerve deficit.  No focal abnormality  Skin: Skin is warm and dry. No rash noted. No erythema.  Psychiatric: She has a normal mood and affect. Her behavior is normal. Judgment and thought content normal.    LABORATORY PANEL:   CBC  Recent Labs Lab 02/27/17 1534  WBC 11.3*  HGB 13.4  HCT 41.4  PLT 380   ------------------------------------------------------------------------------------------------------------------  Chemistries   Recent Labs Lab 02/27/17 1534  NA 135  K 3.6  CL 102  CO2 24  GLUCOSE 107*  BUN 15  CREATININE 1.04*  CALCIUM 9.6  AST 25  ALT 25  ALKPHOS 61  BILITOT 0.8    ------------------------------------------------------------------------------------------------------------------  Cardiac Enzymes  Recent Labs Lab 02/27/17 1534  TROPONINI <0.03   ------------------------------------------------------------------------------------------------------------------  RADIOLOGY:  Dg Chest 2 View  Result Date: 02/27/2017 CLINICAL DATA:  Initial evaluation for acute dizziness, multiple falls. EXAM: CHEST  2 VIEW COMPARISON:  Prior radiograph from 09/29/2016. FINDINGS: The cardiac and mediastinal silhouettes are stable in size and contour, and remain within normal limits. Aortic atherosclerosis. The lungs are normally inflated. Mild left basilar subsegmental atelectasis. No airspace consolidation, pleural effusion, or pulmonary edema is identified. There is no pneumothorax. No acute osseous abnormality identified. IMPRESSION: 1. Mild left basilar subsegmental atelectasis. No other active cardiopulmonary disease. 2. Aortic atherosclerosis. Electronically Signed   By: Jeannine Boga M.D.   On: 02/27/2017 20:49   Ct Head Wo Contrast  Result Date: 02/27/2017 CLINICAL DATA:  Fall. EXAM: CT HEAD WITHOUT CONTRAST TECHNIQUE: Contiguous axial images were obtained from the base of the skull through the vertex without intravenous contrast. COMPARISON:  09/29/2016 FINDINGS: Brain: Old right basal ganglia lacunar infarct. Mild age related volume loss. No acute intracranial abnormality. Specifically, no hemorrhage, hydrocephalus, mass lesion, acute infarction, or significant intracranial injury. Vascular: No hyperdense vessel or unexpected calcification. Skull: No acute calvarial abnormality. Sinuses/Orbits: Visualized paranasal sinuses and mastoids clear. Orbital soft tissues unremarkable. Other: None IMPRESSION: Old right basal ganglia lacunar infarct. No acute intracranial abnormality. Electronically Signed   By: Rolm Baptise M.D.   On: 02/27/2017 15:57   Mr Brain Wo  Contrast  Result Date: 02/27/2017 CLINICAL DATA:  76 y/o F; dizziness and multiple falls. History of stroke. EXAM: MRI HEAD WITHOUT CONTRAST TECHNIQUE: Multiplanar, multiecho pulse sequences of the brain and surrounding structures were obtained without intravenous contrast. COMPARISON:  02/27/2017 CT head.  01/30/2013 MRI head. FINDINGS: Brain: Punctate focus of reduced diffusion within the right posterior frontal lobe (series 100, image 41) compatible with acute/ early subacute infarction. Punctate focus of susceptibility hypointensity within the right anterior pons compatible with hemosiderin deposition of old microhemorrhage. Nonspecific foci of T2 FLAIR hyperintense signal abnormality in subcortical and periventricular white matter are consistent with a moderate chronic microvascular ischemic changes with mild interval progression from 2014. Moderate brain parenchymal volume loss. Small chronic lacunar infarcts within the left cerebellar hemisphere and the right lentiform nucleus are stable. No herniation,  hydrocephalus, or extra-axial collection. Vascular: Normal flow voids. Skull and upper cervical spine: Normal marrow signal. Sinuses/Orbits: No abnormal signal of paranasal sinuses or mastoid air cells. Bilateral intra-ocular lens replacement. Other: None. IMPRESSION: 1. Punctate focus of acute/early subacute infarction within the right posterior frontal lobe. No acute hemorrhage. 2. Moderate chronic microvascular ischemic changes and moderate parenchymal volume loss of the brain. Progression of microvascular disease from 2014. Electronically Signed   By: Kristine Garbe M.D.   On: 02/27/2017 23:33    EKG:   Orders placed or performed during the hospital encounter of 02/27/17  . ED EKG  . ED EKG  . EKG 12-Lead  . EKG 12-Lead    IMPRESSION AND PLAN:  Principal Problem:   UTI (urinary tract infection) - IV antibiotics, urine culture sent Active Problems:   Dizziness - possibly due to  UTI, however also possibly related to central neurological issue, MRI pending   HTN (hypertension) - continue home meds   Diabetes ([place]) - sliding scale insulin with corresponding glucose checks   GERD (gastroesophageal reflux disease) - home dose PPI   HLD (hyperlipidemia) - continue home meds  All the records are reviewed and case discussed with ED provider. Management plans discussed with the patient and/or family.  DVT PROPHYLAXIS: SubQ lovenox  GI PROPHYLAXIS: PPI  ADMISSION STATUS: Observation  CODE STATUS: Full Code Status History    This patient does not have a recorded code status. Please follow your organizational policy for patients in this situation.    Advance Directive Documentation     Most Recent Value  Type of Advance Directive  Healthcare Power of Attorney  Pre-existing out of facility DNR order (yellow form or pink MOST form)  -  "MOST" Form in Place?  -      TOTAL TIME TAKING CARE OF THIS PATIENT: 40 minutes.   Ruthy Forry [place] 02/28/2017, 12:19 AM  [facility]  Office  [phone]  CC: Primary care physician; Idelle Crouch, MD  Note:  This document was prepared using Dragon voice recognition software and may include unintentional dictation errors.

## 2017-02-28 NOTE — Consult Note (Signed)
Reason for Consult:dizziness  Referring Physician: Dr. Edwina Barth   CC: dizziness   HPI: Kristen Guerra is an 76 y.o. female who presents with Confusion and dizziness. Patient states that she's been falling for the last several weeks, and that she has been dizzy for the last day or so. Here in the ED she was found to have likely UTI.  As per pt she states she has been told by family that she has periods of confusion and forgets her tasks. Lives at home with husband.  Dizziness described as positional worse with standing and worse with sudden head turns while walking.    Past Medical History:  Diagnosis Date  . Anxiety   . Depression   . Diabetes mellitus without complication ([place])   . GERD (gastroesophageal reflux disease)   . High cholesterol   . Hypertension   . PSVT (paroxysmal supraventricular tachycardia) ([place])   . Sleep apnea   . Stroke ([place])   . SUI (stress urinary incontinence, female)     Past Surgical History:  Procedure Laterality Date  . ABDOMINAL HYSTERECTOMY    . BREAST BIOPSY Right 1977   negative  . BREAST BIOPSY Right 1988   negative  . BREAST SURGERY    . CHOLECYSTECTOMY    . EYE SURGERY     cataract  . GALLBLADDER SURGERY      Family History  Problem Relation Age of Onset  . Colon cancer Father     Social History:  reports that she has never smoked. She has never used smokeless tobacco. She reports that she drinks alcohol. She reports that she does not use drugs.  Allergies  Allergen Reactions  . Amoxicillin Itching and Other (See Comments)    Reaction: dizziness Has patient had a PCN reaction causing immediate rash, facial/tongue/throat swelling, SOB or lightheadedness with hypotension: No Has patient had a PCN reaction causing severe rash involving mucus membranes or skin necrosis: No Has patient had a PCN reaction that required hospitalization: No Has patient had a PCN reaction occurring within the last 10 years: Yes If all of the above  answers are "NO", then may proceed with Cephalosporin use.   . Lidocaine Itching    Medications: I have reviewed the patient's current medications.  ROS: History obtained from the patient  General ROS: negative for - chills, fatigue, fever, night sweats, weight gain or weight loss Psychological ROS: negative for - behavioral disorder, hallucinations, memory difficulties, mood swings or suicidal ideation Ophthalmic ROS: negative for - blurry vision, double vision, eye pain or loss of vision ENT ROS: negative for - epistaxis, nasal discharge, oral lesions, sore throat, tinnitus or vertigo Allergy and Immunology ROS: negative for - hives or itchy/watery eyes Hematological and Lymphatic ROS: negative for - bleeding problems, bruising or swollen lymph nodes Endocrine ROS: negative for - galactorrhea, hair pattern changes, polydipsia/polyuria or temperature intolerance Respiratory ROS: negative for - cough, hemoptysis, shortness of breath or wheezing Cardiovascular ROS: negative for - chest pain, dyspnea on exertion, edema or irregular heartbeat Gastrointestinal ROS: negative for - abdominal pain, diarrhea, hematemesis, nausea/vomiting or stool incontinence Genito-Urinary ROS: negative for - dysuria, hematuria, incontinence or urinary frequency/urgency Musculoskeletal ROS: negative for - joint swelling or muscular weakness Neurological ROS: as noted in HPI Dermatological ROS: negative for rash and skin lesion changes  Physical Examination: Blood pressure (!) 125/58, pulse (!) 101, temperature 98.5 F (36.9 C), temperature source Oral, resp. rate 20, height 5\' 6"  (1.676 m), weight 89.5 kg (197 lb 6.4  oz), SpO2 96 %.   Neurological Examination   Mental Status: Alert, oriented, thought content appropriate.  Speech fluent without evidence of aphasia.  Able to follow 3 step commands without difficulty. Cranial Nerves: II: Discs flat bilaterally; Visual fields grossly normal, pupils equal,  round, reactive to light and accommodation III,IV, VI: ptosis not present, extra-ocular motions intact bilaterally V,VII: smile symmetric, facial light touch sensation normal bilaterally VIII: hearing normal bilaterally IX,X: gag reflex present XI: bilateral shoulder shrug XII: midline tongue extension Motor: Right : Upper extremity   5/5    Left:     Upper extremity   5/5  Lower extremity   5/5     Lower extremity   5/5 Tone and bulk:normal tone throughout; no atrophy noted Sensory: Pinprick and light touch intact throughout, bilaterally Deep Tendon Reflexes: 2+ and symmetric throughout Plantars: Right: downgoing   Left: downgoing Cerebellar: normal finger-to-nose, normal rapid alternating movements and normal heel-to-shin test Gait: not tested      Laboratory Studies:   Basic Metabolic Panel:  Recent Labs Lab 02/27/17 1534 02/28/17 0531  NA 135 134*  K 3.6 3.2*  CL 102 102  CO2 24 24  GLUCOSE 107* 157*  BUN 15 12  CREATININE 1.04* 0.86  CALCIUM 9.6 8.8*    Liver Function Tests:  Recent Labs Lab 02/27/17 1534  AST 25  ALT 25  ALKPHOS 61  BILITOT 0.8  PROT 7.7  ALBUMIN 4.0   No results for input(s): LIPASE, AMYLASE in the last 168 hours. No results for input(s): AMMONIA in the last 168 hours.  CBC:  Recent Labs Lab 02/27/17 1534 02/28/17 0531  WBC 11.3* 8.8  HGB 13.4 11.6*  HCT 41.4 35.6  MCV 76.4* 75.8*  PLT 380 292    Cardiac Enzymes:  Recent Labs Lab 02/27/17 1534  TROPONINI <0.03    BNP: Invalid input(s): POCBNP  CBG:  Recent Labs Lab 02/27/17 2351 02/28/17 0253 02/28/17 0731  GLUCAP 102* 154* 169*    Microbiology: Results for orders placed or performed in visit on 12/19/13  Clostridium Difficile [facility])     Status: None   Collection Time: 12/19/13  5:03 PM  Result Value Ref Range Status   Micro Text Report   Final       C.DIFFICILE ANTIGEN       C.DIFFICILE GDH ANTIGEN : NEGATIVE   C.DIFFICILE TOXIN A/B     C.DIFFICILE  TOXINS A AND B : NEGATIVE   INTERPRETATION            Negative for C. difficile.    ANTIBIOTIC                                                      Stool culture     Status: None   Collection Time: 12/19/13  5:03 PM  Result Value Ref Range Status   Micro Text Report   Final       COMMENT                   NO SALMONELLA OR SHIGELLA ISOLATED   COMMENT                   NO PATHOGENIC E.COLI DETECTED   COMMENT  NO CAMPYLOBACTER ANTIGEN DETECTED   ANTIBIOTIC                                                        Coagulation Studies: No results for input(s): LABPROT, INR in the last 72 hours.  Urinalysis:  Recent Labs Lab 02/27/17 2134  COLORURINE YELLOW*  LABSPEC 1.018  PHURINE 5.0  GLUCOSEU NEGATIVE  HGBUR NEGATIVE  BILIRUBINUR NEGATIVE  KETONESUR NEGATIVE  PROTEINUR NEGATIVE  NITRITE NEGATIVE  LEUKOCYTESUR LARGE*    Lipid Panel:     Component Value Date/Time   CHOL 162 02/28/2017 0531   TRIG 107 02/28/2017 0531   HDL 31 (L) 02/28/2017 0531   CHOLHDL 5.2 02/28/2017 0531   VLDL 21 02/28/2017 0531   LDLCALC 110 (H) 02/28/2017 0531    HgbA1C: No results found for: HGBA1C  Urine Drug Screen:  No results found for: LABOPIA, COCAINSCRNUR, LABBENZ, AMPHETMU, THCU, LABBARB  Alcohol Level: No results for input(s): ETH in the last 168 hours.  Other results: EKG: normal EKG, normal sinus rhythm, unchanged from previous tracings.  Imaging: Dg Chest 2 View  Result Date: 02/27/2017 CLINICAL DATA:  Initial evaluation for acute dizziness, multiple falls. EXAM: CHEST  2 VIEW COMPARISON:  Prior radiograph from 09/29/2016. FINDINGS: The cardiac and mediastinal silhouettes are stable in size and contour, and remain within normal limits. Aortic atherosclerosis. The lungs are normally inflated. Mild left basilar subsegmental atelectasis. No airspace consolidation, pleural effusion, or pulmonary edema is identified. There is no pneumothorax. No acute osseous  abnormality identified. IMPRESSION: 1. Mild left basilar subsegmental atelectasis. No other active cardiopulmonary disease. 2. Aortic atherosclerosis. Electronically Signed   By: Jeannine Boga M.D.   On: 02/27/2017 20:49   Ct Head Wo Contrast  Result Date: 02/27/2017 CLINICAL DATA:  Fall. EXAM: CT HEAD WITHOUT CONTRAST TECHNIQUE: Contiguous axial images were obtained from the base of the skull through the vertex without intravenous contrast. COMPARISON:  09/29/2016 FINDINGS: Brain: Old right basal ganglia lacunar infarct. Mild age related volume loss. No acute intracranial abnormality. Specifically, no hemorrhage, hydrocephalus, mass lesion, acute infarction, or significant intracranial injury. Vascular: No hyperdense vessel or unexpected calcification. Skull: No acute calvarial abnormality. Sinuses/Orbits: Visualized paranasal sinuses and mastoids clear. Orbital soft tissues unremarkable. Other: None IMPRESSION: Old right basal ganglia lacunar infarct. No acute intracranial abnormality. Electronically Signed   By: Rolm Baptise M.D.   On: 02/27/2017 15:57   Mr Brain Wo Contrast  Result Date: 02/27/2017 CLINICAL DATA:  76 y/o F; dizziness and multiple falls. History of stroke. EXAM: MRI HEAD WITHOUT CONTRAST TECHNIQUE: Multiplanar, multiecho pulse sequences of the brain and surrounding structures were obtained without intravenous contrast. COMPARISON:  02/27/2017 CT head.  01/30/2013 MRI head. FINDINGS: Brain: Punctate focus of reduced diffusion within the right posterior frontal lobe (series 100, image 41) compatible with acute/ early subacute infarction. Punctate focus of susceptibility hypointensity within the right anterior pons compatible with hemosiderin deposition of old microhemorrhage. Nonspecific foci of T2 FLAIR hyperintense signal abnormality in subcortical and periventricular white matter are consistent with a moderate chronic microvascular ischemic changes with mild interval progression  from 2014. Moderate brain parenchymal volume loss. Small chronic lacunar infarcts within the left cerebellar hemisphere and the right lentiform nucleus are stable. No herniation, hydrocephalus, or extra-axial collection. Vascular: Normal flow voids. Skull and upper cervical spine:  Normal marrow signal. Sinuses/Orbits: No abnormal signal of paranasal sinuses or mastoid air cells. Bilateral intra-ocular lens replacement. Other: None. IMPRESSION: 1. Punctate focus of acute/early subacute infarction within the right posterior frontal lobe. No acute hemorrhage. 2. Moderate chronic microvascular ischemic changes and moderate parenchymal volume loss of the brain. Progression of microvascular disease from 2014. Electronically Signed   By: Kristine Garbe M.D.   On: 02/27/2017 23:33   US Carotid Bilateral (at [facility] Only)  Result Date: 02/28/2017 CLINICAL DATA:  Dizziness. EXAM: BILATERAL CAROTID DUPLEX ULTRASOUND TECHNIQUE: Pearline Cables scale imaging, color Doppler and duplex ultrasound were performed of bilateral carotid and vertebral arteries in the neck. COMPARISON:  MRI 02/27/2017. FINDINGS: Criteria: Quantification of carotid stenosis is based on velocity parameters that correlate the residual internal carotid diameter with NASCET-based stenosis levels, using the diameter of the distal internal carotid lumen as the denominator for stenosis measurement. The following velocity measurements were obtained: RIGHT ICA:  94/27 cm/sec CCA:  40/10 cm/sec SYSTOLIC ICA/CCA RATIO:  1.1 DIASTOLIC ICA/CCA RATIO:  1.8 ECA:  73 cm/sec LEFT ICA:  87/24 Cm/sec CCA:  27/25 cm/sec SYSTOLIC ICA/CCA RATIO:  0.9 DIASTOLIC ICA/CCA RATIO:  2.0 ECA:  77 cm/sec RIGHT CAROTID ARTERY: No significant right carotid atherosclerotic vascular disease. No flow limiting stenosis. RIGHT VERTEBRAL ARTERY:  Patent with antegrade flow. LEFT CAROTID ARTERY: No significant left carotid atherosclerotic-disease. No flow limiting stenosis. LEFT VERTEBRAL  ARTERY:  Patent with antegrade flow. IMPRESSION: 1. No significant carotid atherosclerotic vascular disease. No flow limiting stenosis. 2. Vertebrals are patent with antegrade flow. Electronically Signed   By: Marcello Moores  Register   On: 02/28/2017 09:41     Assessment/Plan:   76 y.o. female who presents with Confusion and dizziness. Patient states that she's been falling for the last several weeks, and that she has been dizzy for the last day or so. Here in the ED she was found to have likely UTI.  As per pt she states she has been told by family that she has periods of confusion and forgets her tasks. Lives at home with husband.  Dizziness described as positional worse with standing and worse with sudden head turns while walking.  - Symptoms are clearly positional. Do not think this is stroke related - CTH no acute abnormalities - I would hold off any further imaging at this point from neuro stand point.   - Appreciate Pt evaluation  - on d/c would give PRN meclizine as positional dizziness  - out pt Neurology follow for dementia type of work up. At this point would order TSH, folate, RPR ordered.  Leotis Pain  02/28/2017, 10:29 AM

## 2017-02-28 NOTE — Care Management Obs Status (Signed)
[place] NOTIFICATION   Patient Details  Name: Kristen Guerra MRN: [id number] Date of Birth: 16-Oct-1940   Medicare Observation Status Notification Given:  Yes    Shelbie Ammons, RN 02/28/2017, 1:43 PM
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
## 2017-02-28 NOTE — Progress Notes (Signed)
Subjective: Patient is walking with physical therapy earlier today since she thinks she pulled to the right. She did not complain of any dizziness currently. No chest pain or any other symptoms.  Objective: Vital signs in last 24 hours: Temp:  [97.3 F (36.3 C)-99.1 F (37.3 C)] 98.5 F (36.9 C) (07/02 0808) Pulse Rate:  [80-103] 101 (07/02 1013) Resp:  [18-25] 20 (07/02 0808) BP: (124-143)/(54-92) 125/58 (07/02 1013) SpO2:  [95 %-97 %] 96 % (07/02 0808) Weight:  [86.2 kg (190 lb)-89.5 kg (197 lb 6.4 oz)] 89.5 kg (197 lb 6.4 oz) (07/02 0228) Weight change:  Last BM Date: 02/27/17  Intake/Output from previous day: 07/01 0701 - 07/02 0700 In: 120 [P.O.:120] Out: -  Intake/Output this shift: No intake/output data recorded.  General appearance: no distress Resp: clear to auscultation bilaterally Cardio: regular rate and rhythm, S1, S2 normal, no murmur, click, rub or gallop  Lab Results:  Recent Labs  02/27/17 1534 02/28/17 0531  WBC 11.3* 8.8  HGB 13.4 11.6*  HCT 41.4 35.6  PLT 380 292   BMET  Recent Labs  02/27/17 1534 02/28/17 0531  NA 135 134*  K 3.6 3.2*  CL 102 102  CO2 24 24  GLUCOSE 107* 157*  BUN 15 12  CREATININE 1.04* 0.86  CALCIUM 9.6 8.8*    Studies/Results: Dg Chest 2 View  Result Date: 02/27/2017 CLINICAL DATA:  Initial evaluation for acute dizziness, multiple falls. EXAM: CHEST  2 VIEW COMPARISON:  Prior radiograph from 09/29/2016. FINDINGS: The cardiac and mediastinal silhouettes are stable in size and contour, and remain within normal limits. Aortic atherosclerosis. The lungs are normally inflated. Mild left basilar subsegmental atelectasis. No airspace consolidation, pleural effusion, or pulmonary edema is identified. There is no pneumothorax. No acute osseous abnormality identified. IMPRESSION: 1. Mild left basilar subsegmental atelectasis. No other active cardiopulmonary disease. 2. Aortic atherosclerosis. Electronically Signed   By: Jeannine Boga M.D.   On: 02/27/2017 20:49   Ct Head Wo Contrast  Result Date: 02/27/2017 CLINICAL DATA:  Fall. EXAM: CT HEAD WITHOUT CONTRAST TECHNIQUE: Contiguous axial images were obtained from the base of the skull through the vertex without intravenous contrast. COMPARISON:  09/29/2016 FINDINGS: Brain: Old right basal ganglia lacunar infarct. Mild age related volume loss. No acute intracranial abnormality. Specifically, no hemorrhage, hydrocephalus, mass lesion, acute infarction, or significant intracranial injury. Vascular: No hyperdense vessel or unexpected calcification. Skull: No acute calvarial abnormality. Sinuses/Orbits: Visualized paranasal sinuses and mastoids clear. Orbital soft tissues unremarkable. Other: None IMPRESSION: Old right basal ganglia lacunar infarct. No acute intracranial abnormality. Electronically Signed   By: Rolm Baptise M.D.   On: 02/27/2017 15:57   Mr Brain Wo Contrast  Result Date: 02/27/2017 CLINICAL DATA:  76 y/o F; dizziness and multiple falls. History of stroke. EXAM: MRI HEAD WITHOUT CONTRAST TECHNIQUE: Multiplanar, multiecho pulse sequences of the brain and surrounding structures were obtained without intravenous contrast. COMPARISON:  02/27/2017 CT head.  01/30/2013 MRI head. FINDINGS: Brain: Punctate focus of reduced diffusion within the right posterior frontal lobe (series 100, image 41) compatible with acute/ early subacute infarction. Punctate focus of susceptibility hypointensity within the right anterior pons compatible with hemosiderin deposition of old microhemorrhage. Nonspecific foci of T2 FLAIR hyperintense signal abnormality in subcortical and periventricular white matter are consistent with a moderate chronic microvascular ischemic changes with mild interval progression from 2014. Moderate brain parenchymal volume loss. Small chronic lacunar infarcts within the left cerebellar hemisphere and the right lentiform nucleus are stable. No herniation,  hydrocephalus, or extra-axial collection. Vascular: Normal flow voids. Skull and upper cervical spine: Normal marrow signal. Sinuses/Orbits: No abnormal signal of paranasal sinuses or mastoid air cells. Bilateral intra-ocular lens replacement. Other: None. IMPRESSION: 1. Punctate focus of acute/early subacute infarction within the right posterior frontal lobe. No acute hemorrhage. 2. Moderate chronic microvascular ischemic changes and moderate parenchymal volume loss of the brain. Progression of microvascular disease from 2014. Electronically Signed   By: Kristine Garbe M.D.   On: 02/27/2017 23:33   US Carotid Bilateral (at [facility] Only)  Result Date: 02/28/2017 CLINICAL DATA:  Dizziness. EXAM: BILATERAL CAROTID DUPLEX ULTRASOUND TECHNIQUE: Pearline Cables scale imaging, color Doppler and duplex ultrasound were performed of bilateral carotid and vertebral arteries in the neck. COMPARISON:  MRI 02/27/2017. FINDINGS: Criteria: Quantification of carotid stenosis is based on velocity parameters that correlate the residual internal carotid diameter with NASCET-based stenosis levels, using the diameter of the distal internal carotid lumen as the denominator for stenosis measurement. The following velocity measurements were obtained: RIGHT ICA:  94/27 cm/sec CCA:  62/56 cm/sec SYSTOLIC ICA/CCA RATIO:  1.1 DIASTOLIC ICA/CCA RATIO:  1.8 ECA:  73 cm/sec LEFT ICA:  87/24 Cm/sec CCA:  38/93 cm/sec SYSTOLIC ICA/CCA RATIO:  0.9 DIASTOLIC ICA/CCA RATIO:  2.0 ECA:  77 cm/sec RIGHT CAROTID ARTERY: No significant right carotid atherosclerotic vascular disease. No flow limiting stenosis. RIGHT VERTEBRAL ARTERY:  Patent with antegrade flow. LEFT CAROTID ARTERY: No significant left carotid atherosclerotic-disease. No flow limiting stenosis. LEFT VERTEBRAL ARTERY:  Patent with antegrade flow. IMPRESSION: 1. No significant carotid atherosclerotic vascular disease. No flow limiting stenosis. 2. Vertebrals are patent with antegrade  flow. Electronically Signed   By: Marcello Moores  Register   On: 02/28/2017 09:41    Medications: I have reviewed the patient's current medications.  Assessment/Plan: 1. Urinary tract infection. Patient is currently on antibiotics. Cultures are pending. 2. Dizziness. She is evaluated by neurology who felt this may be an inner ear issue. 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 Currently she is asymptomatic. 3. Possible acute versus subacute infarct. MRI of the brain did show a small area of the right frontal lobe for small infarct. She is Re: On Plavix and statin. We'll discuss with neurology. With this area affected this would not cause her dizziness. She's currently in normal sinus rhythm. Met consider she needs Holter monitor at discharge. 4. Hypertension. Currently controlled with current medications.  Total time spent 20 minutes  LOS: 0 days   Baxter Hire 02/28/2017, 10:38 AM

## 2017-03-01 LAB — ECHOCARDIOGRAM COMPLETE
E decel time: 148 msec
EERAT: 10.89
FS: 36 % (ref 28–44)
HEIGHTINCHES: 66 in
IVS/LV PW RATIO, ED: 1.07
LA ID, A-P, ES: 43 mm
LA diam end sys: 43 mm
LA vol A4C: 54.8 ml
LA vol index: 28.3 mL/m2
LA vol: 58.7 mL
LADIAMINDEX: 2.07 cm/m2
LV TDI E'MEDIAL: 5.34
LV e' LATERAL: 8.06 cm/s
LVEEAVG: 10.89
LVEEMED: 10.89
Lateral S' vel: 10.2 cm/s
MV Dec: 148
MV Peak grad: 3 mmHg
MVAP: 5.12 cm2
MVPKAVEL: 134 m/s
MVPKEVEL: 87.8 m/s
P 1/2 time: 43 ms
P 1/2 time: 491 ms
PW: 7.98 mm — AB (ref 0.6–1.1)
RV TAPSE: 23 mm
TDI e' lateral: 8.06
WEIGHTICAEL: 3158.4 [oz_av]

## 2017-03-01 LAB — URINE CULTURE: Culture: NO GROWTH

## 2017-03-01 LAB — GLUCOSE, CAPILLARY
Glucose-Capillary: 149 mg/dL — ABNORMAL HIGH (ref 65–99)
Glucose-Capillary: 137 mg/dL — ABNORMAL HIGH (ref 65–99)

## 2017-03-01 LAB — HEMOGLOBIN A1C
Hgb A1c MFr Bld: 7.2 % — ABNORMAL HIGH (ref 4.8–5.6)
Mean Plasma Glucose: 160 mg/dL

## 2017-03-01 LAB — RPR: RPR: NONREACTIVE

## 2017-03-01 MED ORDER — LINAGLIPTIN 5 MG PO TABS
5.0000 mg | ORAL_TABLET | Freq: Every day | ORAL | Status: DC
  Administered 2017-03-01: 13:00:00 5 mg via ORAL
  Filled 2017-03-01: qty 1

## 2017-03-01 NOTE — Progress Notes (Signed)
PT Cancellation Note  Patient Details Name: Kristen Guerra MRN: [id number] DOB: 1940/09/30   Cancelled Treatment:    Reason Eval/Treat Not Completed: Patient declined, no reason specified   Pt offered session this am.  Stating she does not feel well but has no specific complaints.  Requested therapy later this morning.  Will attempt again before lunch.   Chesley Noon 03/01/2017, 9:04 AM

## 2017-03-01 NOTE — Care Management (Signed)
Discharge to home today per Dr. Edwina Barth. Will be followed by [place] for [facility] PT and rolling walker. Floydene Flock, [facility] representative updated. Shelbie Ammons RN MSN CCM Care Management [phone]

## 2017-03-01 NOTE — Evaluation (Signed)
Speech Language Pathology Evaluation Patient Details Name: Kristen Guerra MRN: [id number] DOB: November 19, 1940 Today's Date: 03/01/2017 Time: 0800-0900 SLP Time Calculation (min) (ACUTE ONLY): 60 min  Problem List:  Patient Active Problem List   Diagnosis Date Noted  . Dizziness 02/27/2017  . UTI (urinary tract infection) 02/27/2017  . GERD (gastroesophageal reflux disease) 02/27/2017  . HTN (hypertension) 02/27/2017  . HLD (hyperlipidemia) 02/27/2017  . Diabetes ([place]) 02/27/2017   Past Medical History:  Past Medical History:  Diagnosis Date  . Anxiety   . Depression   . Diabetes mellitus without complication ([place])   . GERD (gastroesophageal reflux disease)   . High cholesterol   . Hypertension   . PSVT (paroxysmal supraventricular tachycardia) ([place])   . Sleep apnea   . Stroke ([place])   . SUI (stress urinary incontinence, female)    Past Surgical History:  Past Surgical History:  Procedure Laterality Date  . ABDOMINAL HYSTERECTOMY    . BREAST BIOPSY Right 1977   negative  . BREAST BIOPSY Right 1988   negative  . BREAST SURGERY    . CHOLECYSTECTOMY    . EYE SURGERY     cataract  . GALLBLADDER SURGERY     HPI:  Pt is a 76 y.o. female w/ h/o anxiety/depression, HTN, stroke, GERD, UTI per pt who presents with confusion and dizziness. Patient states that she's been falling for the last several weeks, and that she has been dizzy for the last day or so. Here in the ED she was found to have likely UTI. However, given her symptoms there is some concern for possible stroke as well, and MRI was ordered. Hospitalists were called for admission. Per MRI, a Punctate focus of acute/early subacute infarction within the right posterior frontal lobe. Pt denied any difficulty w/ her swallowing and is tolerating a regular diet as ordered by MD. Pt does present w/ reduced topic maintenance and is quite talkative.    Assessment / Plan / Recommendation Clinical Impression  Pt appears to present  w/ adequate Cognitive-Linguistic abilities overall and is able to verbally communicate in conversation w/ others w/ intelligible speech. Pt does tend to present w/ reduced topic maintenance and is easily distracted at times during interaction; she appropriately redirects her attention to tasks w/ simple verbal cue when needed. She appears overly concerned re: others' assessment of her and any deficits noted and feels her children often "tell me what to do and not do". Pt was given the [facility] Cognitive Assessment [facility]) in which she scored a 27/30 indicating adequate Cognitive-Linguistic skills as assessed by a screening tool. Pt did have min difficulty w/ Delayed Recall of Word/Memory. Pt easily completed tasks involving numbers(a strength of hers she stated). When asked after the assessment, pt stated she is often forgetful of details, names, dates at home but compares herself to her friends and feels she is "just like them". Pt denied she felt she was significantly "different" from her baseline at home. Reviewed general recommendations for pt to slow down and take her time w/ tasks, speech and to hydrate regularly/daily as her mouth appeared somewhat sticky/dry - Husband stated drinking fluids was a "big challenge" for pt at home which may be contributing to her UTIs and dizziness. Recommended pt and Husband f/u w/ Neurologist post discharge w/ any further concerns and request a Speech Evaluation if indicated. Pt/Husband agreed. No further services indicated at this time.     SLP Assessment  SLP Recommendation/Assessment: Patient does not need any further Speech  [facility] SLP Visit Diagnosis: Cognitive communication deficit (R41.841)    Follow Up Recommendations  None (at this time)    Frequency and Duration           SLP Evaluation Cognition  Overall Cognitive Status: Within Functional Limits for tasks assessed Arousal/Alertness: Awake/alert Orientation Level: Oriented  X4 Attention: Focused Focused Attention: Appears intact (at her baseline) Memory: Appears intact Awareness: Appears intact Problem Solving: Appears intact Executive Function: Reasoning;Sequencing;Organizing;Decision Making Reasoning: Appears intact Sequencing: Appears intact Organizing: Appears intact Decision Making: Appears intact Behaviors: Impulsive (min) Safety/Judgment: Appears intact       Comprehension  Auditory Comprehension Overall Auditory Comprehension: Appears within functional limits for tasks assessed Yes/No Questions: Within Functional Limits Commands: Within Functional Limits Conversation: Complex Other Conversation Comments: reduced topic maintenance at times but redirected easily w/ verbal cue Interfering Components: Attention (min) EffectiveTechniques: Slowed speech Reading Comprehension Reading Status: Within funtional limits    Expression Expression Primary Mode of Expression: Verbal Verbal Expression Overall Verbal Expression: Appears within functional limits for tasks assessed Initiation: No impairment Automatic Speech:  (all wfl) Level of Generative/Spontaneous Verbalization: Conversation Repetition: No impairment Naming: No impairment Pragmatics: Impairment (min) Impairments: Topic maintenance Interfering Components: Attention (min) Effective Techniques:  (slowing down) Non-Verbal Means of Communication:  (n/a) Written Expression Dominant Hand: Right Written Expression: Not tested   Oral / Motor  Oral Motor/Sensory Function Overall Oral Motor/Sensory Function: Within functional limits Motor Speech Overall Motor Speech: Appears within functional limits for tasks assessed Respiration: Within functional limits Phonation: Normal Resonance: Within functional limits Articulation: Within functional limitis Intelligibility: Intelligible Motor Planning: Witnin functional limits Motor Speech Errors: Not applicable   GO          Functional  Assessment Tool Used: clinical judgement Functional Limitations: Spoken language expressive Spoken Language Expression Current Status [phone]): At least 1 percent but less than 20 percent impaired, limited or restricted Spoken Language Expression Goal Status [phone]): At least 1 percent but less than 20 percent impaired, limited or restricted Spoken Language Expression Discharge Status [phone]): At least 1 percent but less than 20 percent impaired, limited or restricted          Orinda Kenner, [place], CCC-SLP Rashon Rezek,[place] 03/01/2017, 4:18 PM

## 2017-03-01 NOTE — Progress Notes (Signed)
Physical Therapy Treatment Patient Details Name: Kristen Guerra MRN: [id number] DOB: Jan 06, 1941 Today's Date: 03/01/2017    History of Present Illness Kristen Guerra is a 76yo white female who [facility] after worsing dizziness and AMS. Pt admitted for UTI, but MRI showing acute to subacute infarct in Rt Frontal Lobe. PMH:  GAD, DM, HT, PSVT, OSA, CVA. Pt reports over the past 3-4 weeks, she has been falling frequently. She also attests to feeling generally more sluggish and weak motorically.     PT Comments    Pt agrees to session with encouragement.  Overall bed mobility improved with encouragement and min assist.  She is able to stand and ambulate x 2 around nursing unit with walker and min assist.  At times, pt lets walker get too far ahead of her and is easily distracted by activity in hallway.  Gait quality decreases with distraction but does not have any LOB's.  She did need frequent verbal cues for proper walker use and x 1 when she waved at staff member while walking.  Educated to stop walking before taking hands off walker to wave or for tasks.   She stated she is more aware of "drifting" and does a better job trying to correct today.   Recommend walker at all times upon discharge.  She stated she has a cane at home and used that mostly before admit.  Stated she has a walker at home.   Follow Up Recommendations  Home health PT;Supervision for mobility/OOB     Equipment Recommendations  Rolling walker with 5" wheels    Recommendations for Other Services       Precautions / Restrictions Precautions Precautions: Fall Restrictions Weight Bearing Restrictions: No    Mobility  Bed Mobility Overal bed mobility: Needs Assistance Bed Mobility: Supine to Sit     Supine to sit: Min assist     General bed mobility comments: with verbal and tactile cues for hand placements and technique  Transfers Overall transfer level: Needs assistance Equipment used:  None Transfers: Sit to/from Stand Sit to Stand: Supervision         General transfer comment: mod verbal cues for hand placements,  attempts to pull up on walker  Ambulation/Gait Ambulation/Gait assistance: Min assist Ambulation Distance (Feet): 550 Feet Assistive device: Rolling walker (2 wheeled) Gait Pattern/deviations: Step-through pattern     General Gait Details: Pt aware of drifting yesterday and does a good job trying to correct.  At times lets walker get too far in front of her and needs assist to correct.  Easily distracted.   Stairs            Wheelchair Mobility    Modified Rankin (Stroke Patients Only)       Balance Overall balance assessment: History of Falls;Needs assistance Sitting-balance support: Feet supported Sitting balance-Leahy Scale: Good     Standing balance support: Bilateral upper extremity supported Standing balance-Leahy Scale: Fair                              Cognition Arousal/Alertness: Awake/alert Behavior During Therapy: WFL for tasks assessed/performed Overall Cognitive Status: Within Functional Limits for tasks assessed                                        Exercises      General  Comments        Pertinent Vitals/Pain Pain Assessment: 0-10 Pain Score: 5  Pain Location: across abdomen Pain Descriptors / Indicators: Aching;Discomfort Pain Intervention(s): Limited activity within patient's tolerance    Home Living                      Prior Function            PT Goals (current goals can now be found in the care plan section) Progress towards PT goals: Progressing toward goals    Frequency           PT Plan Current plan remains appropriate    Co-evaluation              AM-PAC PT "6 Clicks" Daily Activity  Outcome Measure  Difficulty turning over in bed (including adjusting bedclothes, sheets and blankets)?: None Difficulty moving from lying on back to  sitting on the side of the bed? : Total Difficulty sitting down on and standing up from a chair with arms (e.g., wheelchair, bedside commode, etc,.)?: A Little Help needed moving to and from a bed to chair (including a wheelchair)?: A Little Help needed walking in hospital room?: A Little Help needed climbing 3-5 steps with a railing? : A Lot 6 Click Score: 16    End of Session Equipment Utilized During Treatment: Gait belt Activity Tolerance: Patient tolerated treatment well Patient left: with call bell/phone within reach;in chair;with chair alarm set         Time: 3383-2919 PT Time Calculation (min) (ACUTE ONLY): 23 min  Charges:  $Gait Training: 23-37 mins                    G Codes:       Chesley Noon, PTA 03/01/17, 12:17 PM

## 2017-03-03 NOTE — Discharge Summary (Signed)
Physician Discharge Summary  Patient ID: Kristen Guerra MRN: [id number] DOB/AGE: 77-Nov-1942 77 y.o.  Admit date: 02/27/2017 Discharge date: 03/03/2017  Admission Diagnoses:1. CVA 2. Urinary tract infection 3. Dizziness  Discharge Diagnoses:  Principal Problem:   UTI (urinary tract infection) Active Problems:   Dizziness   GERD (gastroesophageal reflux disease)   HTN (hypertension)   HLD (hyperlipidemia)   Diabetes ([place])   Discharged Condition: stable  Hospital Course: 1. CVA. Patient came in with dizziness. MRI was ordered which showed small punctate acute versus subacute infarct in the right frontal lobe. This not correlate with her symptoms. She is on Plavix and Lipitor. These medications were continued. Echocardiogram and carotid Dopplers did not reveal any abnormalities. 2. Urinary tract infection. Patient was treated with antibiotics 3. Dizziness. Patient had no further episodes during hospitalization.  Consults: neurology  Discharge Exam: Blood pressure 121/63, pulse 85, temperature 97.8 F (36.6 C), temperature source Oral, resp. rate 18, height 5\' 6"  (1.676 m), weight 89.5 kg (197 lb 6.4 oz), SpO2 95 %. General appearance: alert Cardio: regular rate and rhythm, S1, S2 normal, no murmur, click, rub or gallop Neurologic: Alert and oriented X 3, normal strength and tone. Normal symmetric reflexes. Normal coordination and gait  Disposition: 01-Home or Self Care  Discharge Instructions    Diet - low sodium heart healthy    Complete by:  As directed    Face-to-face encounter (required for Medicare/Medicaid patients)    Complete by:  As directed    I Harrel Lemon D certify that this patient is under my care and that I, or a nurse practitioner or physician's assistant working with me, had a face-to-face encounter that meets the physician face-to-face encounter requirements with this patient on 03/01/2017. The encounter with the patient was in whole, or in part for the  following medical condition(s) which is the primary reason for home health care (List medical condition):CVA  CVA   The encounter with the patient was in whole, or in part, for the following medical condition, which is the primary reason for home health care:  CVA   I certify that, based on my findings, the following services are medically necessary home health services:  Physical therapy   Reason for Medically Necessary Home Health Services:  Therapy- Personnel officer, Public librarian   My clinical findings support the need for the above services:  Unsafe ambulation due to balance issues   Further, I certify that my clinical findings support that this patient is homebound due to:  Unsafe ambulation due to balance issues   Home Health    Complete by:  As directed    To provide the following care/treatments:   PT OT     Increase activity slowly    Complete by:  As directed      Allergies as of 03/01/2017      Reactions   Amoxicillin Itching, Other (See Comments)   Reaction: dizziness Has patient had a PCN reaction causing immediate rash, facial/tongue/throat swelling, SOB or lightheadedness with hypotension: No Has patient had a PCN reaction causing severe rash involving mucus membranes or skin necrosis: No Has patient had a PCN reaction that required hospitalization: No Has patient had a PCN reaction occurring within the last 10 years: Yes If all of the above answers are "NO", then may proceed with Cephalosporin use.   Lidocaine Itching      Medication List    TAKE these medications   atorvastatin 40 MG tablet  Commonly known as:  LIPITOR Take 40 mg by mouth daily.   cholecalciferol 1000 units tablet Commonly known as:  VITAMIN D Take 1,000 Units by mouth daily.   ciprofloxacin 250 MG tablet Commonly known as:  CIPRO Take 250 mg by mouth 2 (two) times daily.   clopidogrel 75 MG tablet Commonly known as:  PLAVIX Take 75 mg by mouth daily.   cyanocobalamin  1000 MCG tablet Take 1,000 mcg by mouth daily.   DULoxetine 60 MG capsule Commonly known as:  CYMBALTA Take 60 mg by mouth daily.   glimepiride 4 MG tablet Commonly known as:  AMARYL Take 2-4 mg by mouth 2 (two) times daily. 2 mg in the morning and 4 mg at night   hydrochlorothiazide 25 MG tablet Commonly known as:  HYDRODIURIL Take 25 mg by mouth daily.   insulin glargine 100 unit/mL Sopn Commonly known as:  LANTUS Inject 36 Units into the skin at bedtime.   JANUVIA 100 MG tablet Generic drug:  sitaGLIPtin Take 100 mg by mouth daily.   lamoTRIgine 100 MG tablet Commonly known as:  LAMICTAL Take 100 mg by mouth 2 (two) times daily.   Magnesium Oxide -Mg Supplement 400 MG Caps Take 400 mg by mouth daily.   meclizine 12.5 MG tablet Commonly known as:  ANTIVERT Take 12.5 mg by mouth 3 (three) times daily as needed for dizziness.   metoprolol succinate 25 MG 24 hr tablet Commonly known as:  TOPROL-XL Take 25 mg by mouth daily.   MYRBETRIQ 50 MG Tb24 tablet Generic drug:  mirabegron ER Take 50 mg by mouth daily.   naproxen sodium 220 MG tablet Commonly known as:  ANAPROX Take 220 mg by mouth 2 (two) times daily as needed (pain). Reported on 02/13/2016   ONE TOUCH ULTRA TEST test strip Generic drug:  glucose blood   oxybutynin 10 MG 24 hr tablet Commonly known as:  DITROPAN-XL Take 10 mg by mouth at bedtime.   pantoprazole 40 MG tablet Commonly known as:  PROTONIX Take 40 mg by mouth daily.   verapamil 180 MG CR tablet Commonly known as:  CALAN-SR Take 360 mg by mouth daily.      Follow-up Information    Idelle Crouch, MD On 03/15/2017.   Specialty:  Internal Medicine Why:  at 9:00 a.m. Contact information: [place] [address] [phone]           Signed: Baxter Hire 03/03/2017, 5:10 PM

## 2017-03-15 ENCOUNTER — Other Ambulatory Visit: Payer: Self-pay

## 2017-03-17 DIAGNOSIS — Z8673 Personal history of transient ischemic attack (TIA), and cerebral infarction without residual deficits: Secondary | ICD-10-CM | POA: Insufficient documentation

## 2017-03-28 NOTE — Progress Notes (Signed)
03/30/2017 10:08 AM   Kristen Guerra Apr 03, 1941 [id number]  Referring provider: Idelle Crouch, MD [facility] [place], [place] [address]  Chief Complaint  Patient presents with  . New Patient (Initial Visit)    Dysuria referred by Dr. Doy Hutching    HPI: Patient is a 76 -year-old Caucasian female who is referred to [place] by, Dr. Fulton Reek, for dysuria.    She stated that she started to experienced vaginal itching.  She was given a "medication" and the symptoms have abated.  She has not had any symptoms for two weeks.  She had been on vaginal estrogen cream in the past, but she has not been on the cream for over one year.    She has an occasional frequency, urgency, dysuria, nocturia and incontinence.  She denies dysuria, gross hematuria, suprapubic pain, back pain, abdominal pain or flank pain.  She has not had any recent fevers, chills, nausea or vomiting.    She has not had any recent fevers, chills, nausea or vomiting.   She does not have a history of nephrolithiasis, GU surgery or GU trauma.   She is not sexually active.  She is postmenopausal.  She admits to constipation and/or diarrhea.   She does engage in good perineal hygiene.  She does take tub baths.   She has incontinence.  She is using incontinence pads.   She is not having pain with bladder filling.   She has not had any recent imaging studies.    She is drinking 32 ounces of water daily.    One cup of coffee daily.  No sweet teas and an occasional soda.    Her PVR is 0 mL.    Reviewed her records - she has not had a positive urine culture since 07/2016  PMH: Past Medical History:  Diagnosis Date  . Anxiety   . Depression   . Diabetes mellitus without complication ([place])   . GERD (gastroesophageal reflux disease)   . High cholesterol   . Hypertension   . PSVT (paroxysmal supraventricular tachycardia) ([place])   . Sleep apnea   . Stroke ([place])   . SUI (stress urinary  incontinence, female)     Surgical History: Past Surgical History:  Procedure Laterality Date  . ABDOMINAL HYSTERECTOMY    . BREAST BIOPSY Right 1977   negative  . BREAST BIOPSY Right 1988   negative  . BREAST SURGERY    . CHOLECYSTECTOMY    . EYE SURGERY     cataract  . GALLBLADDER SURGERY      Home Medications:  Allergies as of 03/30/2017      Reactions   Amoxicillin Itching, Other (See Comments)   Reaction: dizziness Has patient had a PCN reaction causing immediate rash, facial/tongue/throat swelling, SOB or lightheadedness with hypotension: No Has patient had a PCN reaction causing severe rash involving mucus membranes or skin necrosis: No Has patient had a PCN reaction that required hospitalization: No Has patient had a PCN reaction occurring within the last 10 years: Yes If all of the above answers are "NO", then may proceed with Cephalosporin use.   Lidocaine Itching      Medication List       Accurate as of 03/30/17 10:08 AM. Always use your most recent med list.          aspirin EC 81 MG tablet Take by mouth.   atorvastatin 40 MG tablet Commonly known as:  LIPITOR Take 40 mg  by mouth daily.   cholecalciferol 1000 units tablet Commonly known as:  VITAMIN D Take 1,000 Units by mouth daily.   ciprofloxacin 250 MG tablet Commonly known as:  CIPRO Take 250 mg by mouth 2 (two) times daily.   clopidogrel 75 MG tablet Commonly known as:  PLAVIX Take 75 mg by mouth daily.   cyanocobalamin 1000 MCG tablet Take 1,000 mcg by mouth daily.   DULoxetine 60 MG capsule Commonly known as:  CYMBALTA Take 60 mg by mouth daily.   estradiol 0.1 MG/GM vaginal cream Commonly known as:  ESTRACE Place vaginally.   glimepiride 4 MG tablet Commonly known as:  AMARYL Take 2-4 mg by mouth 2 (two) times daily. 2 mg in the morning and 4 mg at night   hydrochlorothiazide 25 MG tablet Commonly known as:  HYDRODIURIL Take 25 mg by mouth daily.   insulin glargine 100  unit/mL Sopn Commonly known as:  LANTUS Inject 36 Units into the skin at bedtime.   JANUVIA 100 MG tablet Generic drug:  sitaGLIPtin Take 100 mg by mouth daily.   lamoTRIgine 100 MG tablet Commonly known as:  LAMICTAL Take 100 mg by mouth 2 (two) times daily.   Magnesium Gluconate 550 MG Tabs Take 30 mg by mouth.   Magnesium Oxide -Mg Supplement 400 MG Caps Take 400 mg by mouth daily.   magnesium oxide 400 MG tablet Commonly known as:  MAG-OX Take by mouth.   meclizine 12.5 MG tablet Commonly known as:  ANTIVERT Take 12.5 mg by mouth 3 (three) times daily as needed for dizziness.   metoprolol succinate 25 MG 24 hr tablet Commonly known as:  TOPROL-XL Take 25 mg by mouth daily.   MYRBETRIQ 50 MG Tb24 tablet Generic drug:  mirabegron ER Take 50 mg by mouth daily.   naproxen sodium 220 MG tablet Commonly known as:  ANAPROX Take 220 mg by mouth 2 (two) times daily as needed (pain). Reported on 02/13/2016   ONE TOUCH ULTRA TEST test strip Generic drug:  glucose blood   oxybutynin 10 MG 24 hr tablet Commonly known as:  DITROPAN-XL Take 10 mg by mouth at bedtime.   pantoprazole 40 MG tablet Commonly known as:  PROTONIX Take 40 mg by mouth daily.   verapamil 180 MG CR tablet Commonly known as:  CALAN-SR Take 360 mg by mouth daily.       Allergies:  Allergies  Allergen Reactions  . Amoxicillin Itching and Other (See Comments)    Reaction: dizziness Has patient had a PCN reaction causing immediate rash, facial/tongue/throat swelling, SOB or lightheadedness with hypotension: No Has patient had a PCN reaction causing severe rash involving mucus membranes or skin necrosis: No Has patient had a PCN reaction that required hospitalization: No Has patient had a PCN reaction occurring within the last 10 years: Yes If all of the above answers are "NO", then may proceed with Cephalosporin use.   . Lidocaine Itching    Family History: Family History  Problem Relation  Age of Onset  . Colon cancer Father   . Kidney cancer Neg Hx   . Bladder Cancer Neg Hx     Social History:  reports that she has never smoked. She has never used smokeless tobacco. She reports that she drinks alcohol. She reports that she does not use drugs.  ROS: UROLOGY Frequent Urination?: Yes Hard to postpone urination?: Yes Burning/pain with urination?: Yes Get up at night to urinate?: Yes Leakage of urine?: Yes Urine stream starts and  stops?: No Trouble starting stream?: No Do you have to strain to urinate?: No Blood in urine?: No Urinary tract infection?: Yes Sexually transmitted disease?: No Injury to kidneys or bladder?: No Painful intercourse?: No Weak stream?: No Currently pregnant?: No Vaginal bleeding?: No Last menstrual period?: n  Gastrointestinal Nausea?: No Vomiting?: No Indigestion/heartburn?: No Diarrhea?: Yes Constipation?: Yes  Constitutional Fever: No Night sweats?: No Weight loss?: No Fatigue?: Yes  Skin Skin rash/lesions?: No Itching?: No  Eyes Blurred vision?: No Double vision?: No  Ears/Nose/Throat Sore throat?: Yes Sinus problems?: No  Hematologic/Lymphatic Swollen glands?: No Easy bruising?: No  Cardiovascular Leg swelling?: No Chest pain?: No  Respiratory Cough?: Yes Shortness of breath?: No  Endocrine Excessive thirst?: No  Musculoskeletal Back pain?: Yes Joint pain?: Yes  Neurological Headaches?: Yes Dizziness?: Yes  Psychologic Depression?: Yes Anxiety?: Yes  Physical Exam: BP 122/75   Pulse 66   Ht 5\' 6"  (1.676 m)   Wt 194 lb (88 kg)   BMI 31.31 kg/m   Constitutional: Well nourished. Alert and oriented, No acute distress. HEENT: Shorter AT, moist mucus membranes. Trachea midline, no masses. Cardiovascular: No clubbing, cyanosis, or edema. Respiratory: Normal respiratory effort, no increased work of breathing. GI: Abdomen is soft, non tender, non distended, no abdominal masses. Liver and spleen not  palpable.  No hernias appreciated.  Stool sample for occult testing is not indicated.   GU: No CVA tenderness.  No bladder fullness or masses.  Atrophic external genitalia, normal pubic hair distribution, no lesions.  Normal urethral meatus, no lesions, no prolapse, no discharge.   No urethral masses, tenderness and/or tenderness. No bladder fullness, tenderness or masses. Pale vagina mucosa, poor estrogen effect, no discharge, no lesions, good pelvic support, Grade I cystocele is noted.  No rectocele noted.  Cervix, uterus and adnexa are surgically absent.  Anus and perineum are without rashes or lesions.    Skin: No rashes, bruises or suspicious lesions. Lymph: No cervical or inguinal adenopathy. Neurologic: Grossly intact, no focal deficits, moving all 4 extremities. Psychiatric: Normal mood and affect.  Laboratory Data: Lab Results  Component Value Date   WBC 8.8 02/28/2017   HGB 11.6 (L) 02/28/2017   HCT 35.6 02/28/2017   MCV 75.8 (L) 02/28/2017   PLT 292 02/28/2017    Lab Results  Component Value Date   CREATININE 0.86 02/28/2017     Lab Results  Component Value Date   HGBA1C 7.2 (H) 02/28/2017    Lab Results  Component Value Date   TSH 1.480 02/28/2017       Component Value Date/Time   CHOL 162 02/28/2017 0531   HDL 31 (L) 02/28/2017 0531   CHOLHDL 5.2 02/28/2017 0531   VLDL 21 02/28/2017 0531   LDLCALC 110 (H) 02/28/2017 0531    Lab Results  Component Value Date   AST 25 02/27/2017   Lab Results  Component Value Date   ALT 25 02/27/2017    I have reviewed the labs.   Pertinent Imaging: Results for TEMICA, RIGHETTI (MRN [id number]) as of 03/30/2017 09:53  Ref. Range 03/30/2017 09:34  Scan Result Unknown 0   I have independently reviewed the films.    Assessment & Plan:    1. Dysuria  - resolved  - patient will contact us if she develops symptoms again  2. Vaginal atrophy  - not a candidate for restarted estrogen cream due to recent  strokes  3. SUI  - minimal bother to the patient   Return if  symptoms worsen or fail to improve.  These notes generated with voice recognition software. I apologize for typographical errors.  Zara Council, [facility] [address] [place], [place] [address] [phone]

## 2017-03-30 ENCOUNTER — Ambulatory Visit (INDEPENDENT_AMBULATORY_CARE_PROVIDER_SITE_OTHER): Payer: Medicare Other | Admitting: Urology

## 2017-03-30 ENCOUNTER — Encounter: Payer: Self-pay | Admitting: Urology

## 2017-03-30 VITALS — BP 122/75 | HR 66 | Ht 66.0 in | Wt 194.0 lb

## 2017-03-30 DIAGNOSIS — R300 Dysuria: Secondary | ICD-10-CM

## 2017-03-30 DIAGNOSIS — N952 Postmenopausal atrophic vaginitis: Secondary | ICD-10-CM

## 2017-03-30 DIAGNOSIS — N393 Stress incontinence (female) (male): Secondary | ICD-10-CM

## 2017-03-30 LAB — BLADDER SCAN AMB NON-IMAGING: Scan Result: 0

## 2017-04-15 ENCOUNTER — Emergency Department: Payer: Medicare Other

## 2017-04-15 ENCOUNTER — Encounter: Payer: Self-pay | Admitting: Emergency Medicine

## 2017-04-15 ENCOUNTER — Emergency Department
Admission: EM | Admit: 2017-04-15 | Discharge: 2017-04-15 | Disposition: A | Discharge status: Home / Self Care | Payer: Medicare Other | Attending: Emergency Medicine | Admitting: Emergency Medicine

## 2017-04-15 DIAGNOSIS — Z794 Long term (current) use of insulin: Secondary | ICD-10-CM | POA: Diagnosis not present

## 2017-04-15 DIAGNOSIS — S0003XA Contusion of scalp, initial encounter: Secondary | ICD-10-CM | POA: Diagnosis not present

## 2017-04-15 DIAGNOSIS — Y93E8 Activity, other personal hygiene: Secondary | ICD-10-CM | POA: Diagnosis not present

## 2017-04-15 DIAGNOSIS — Z79899 Other long term (current) drug therapy: Secondary | ICD-10-CM | POA: Insufficient documentation

## 2017-04-15 DIAGNOSIS — Y92002 Bathroom of unspecified non-institutional (private) residence single-family (private) house as the place of occurrence of the external cause: Secondary | ICD-10-CM | POA: Insufficient documentation

## 2017-04-15 DIAGNOSIS — S0990XA Unspecified injury of head, initial encounter: Secondary | ICD-10-CM | POA: Diagnosis present

## 2017-04-15 DIAGNOSIS — W010XXA Fall on same level from slipping, tripping and stumbling without subsequent striking against object, initial encounter: Secondary | ICD-10-CM | POA: Insufficient documentation

## 2017-04-15 DIAGNOSIS — W19XXXA Unspecified fall, initial encounter: Secondary | ICD-10-CM

## 2017-04-15 DIAGNOSIS — Z7902 Long term (current) use of antithrombotics/antiplatelets: Secondary | ICD-10-CM | POA: Insufficient documentation

## 2017-04-15 DIAGNOSIS — E119 Type 2 diabetes mellitus without complications: Secondary | ICD-10-CM | POA: Insufficient documentation

## 2017-04-15 DIAGNOSIS — Z7982 Long term (current) use of aspirin: Secondary | ICD-10-CM | POA: Insufficient documentation

## 2017-04-15 DIAGNOSIS — I10 Essential (primary) hypertension: Secondary | ICD-10-CM | POA: Insufficient documentation

## 2017-04-15 DIAGNOSIS — Y999 Unspecified external cause status: Secondary | ICD-10-CM | POA: Diagnosis not present

## 2017-04-15 DIAGNOSIS — N3000 Acute cystitis without hematuria: Secondary | ICD-10-CM | POA: Diagnosis not present

## 2017-04-15 LAB — URINALYSIS, COMPLETE (UACMP) WITH MICROSCOPIC
BACTERIA UA: NONE SEEN
Bilirubin Urine: NEGATIVE
GLUCOSE, UA: 50 mg/dL — AB
HGB URINE DIPSTICK: NEGATIVE
Ketones, ur: NEGATIVE mg/dL
NITRITE: NEGATIVE
PH: 5 (ref 5.0–8.0)
PROTEIN: NEGATIVE mg/dL
Specific Gravity, Urine: 1.029 (ref 1.005–1.030)

## 2017-04-15 LAB — BASIC METABOLIC PANEL
Anion gap: 8 (ref 5–15)
BUN: 16 mg/dL (ref 6–20)
CHLORIDE: 104 mmol/L (ref 101–111)
CO2: 29 mmol/L (ref 22–32)
CREATININE: 1.21 mg/dL — AB (ref 0.44–1.00)
Calcium: 9.7 mg/dL (ref 8.9–10.3)
GFR, EST AFRICAN AMERICAN: 49 mL/min — AB (ref >60)
GFR, EST NON AFRICAN AMERICAN: 42 mL/min — AB (ref >60)
Glucose, Bld: 109 mg/dL — ABNORMAL HIGH (ref 65–99)
POTASSIUM: 3.3 mmol/L — AB (ref 3.5–5.1)
SODIUM: 141 mmol/L (ref 135–145)

## 2017-04-15 LAB — CBC
HEMATOCRIT: 40.3 % (ref 35.0–47.0)
Hemoglobin: 12.7 g/dL (ref 12.0–16.0)
MCH: 24.4 pg — ABNORMAL LOW (ref 26.0–34.0)
MCHC: 31.7 g/dL — ABNORMAL LOW (ref 32.0–36.0)
MCV: 77.2 fL — AB (ref 80.0–100.0)
PLATELETS: 411 10*3/uL (ref 150–440)
RBC: 5.21 MIL/uL — AB (ref 3.80–5.20)
RDW: 17.9 % — ABNORMAL HIGH (ref 11.5–14.5)
WBC: 10.3 10*3/uL (ref 3.6–11.0)

## 2017-04-15 LAB — TROPONIN I: Troponin I: <0.03 ng/mL (ref <0.03)

## 2017-04-15 MED ORDER — CEFTRIAXONE SODIUM 1 G IJ SOLR
1.0000 g | Freq: Once | INTRAMUSCULAR | Status: AC
  Administered 2017-04-15: 1 g via INTRAVENOUS
  Filled 2017-04-15: qty 10

## 2017-04-15 MED ORDER — CEPHALEXIN 500 MG PO CAPS: 500.0000 mg | ORAL_CAPSULE | Freq: Two times a day (BID) | ORAL | 0 refills | Status: AC

## 2017-04-15 NOTE — ED Triage Notes (Signed)
Pt to rm 15 via EMS from home, report fall while going to the bathroom, report hematoma to back of head and pain to right side of neck.  PT in NAD at this time.

## 2017-04-15 NOTE — ED Provider Notes (Signed)
[facility] Emergency Department Provider Note   First MD Initiated Contact with Patient 04/15/17 0422     (approximate)  I have reviewed the triage vital signs and the nursing notes.   HISTORY  Chief Complaint Fall    HPI Kristen Guerra is a 76 y.o. female with bolus of chronic medical conditions presents to the emergency department with history of a fall tonight. Patient states that she fell while going to the bathroom she believes that she may have bent over to pick something up and lost her balance. Patient admits to hitting the occipital portion of her head however denies any loss of consciousness. Patient does admit to occipital and right side neck pain that is currently 6 out of 10. Patient denies any weakness numbness gait instability or visual changes.   Past Medical History:  Diagnosis Date  . Anxiety   . Depression   . Diabetes mellitus without complication ([place])   . GERD (gastroesophageal reflux disease)   . High cholesterol   . Hypertension   . PSVT (paroxysmal supraventricular tachycardia) ([place])   . Sleep apnea   . Stroke ([place])   . SUI (stress urinary incontinence, female)     Patient Active Problem List   Diagnosis Date Noted  . Dizziness 02/27/2017  . UTI (urinary tract infection) 02/27/2017  . GERD (gastroesophageal reflux disease) 02/27/2017  . HTN (hypertension) 02/27/2017  . HLD (hyperlipidemia) 02/27/2017  . Diabetes ([place]) 02/27/2017    Past Surgical History:  Procedure Laterality Date  . ABDOMINAL HYSTERECTOMY    . BREAST BIOPSY Right 1977   negative  . BREAST BIOPSY Right 1988   negative  . BREAST SURGERY    . CHOLECYSTECTOMY    . EYE SURGERY     cataract  . GALLBLADDER SURGERY      Prior to Admission medications   Medication Sig Start Date End Date Taking? Authorizing Provider  aspirin EC 81 MG tablet Take by mouth.    [provider]  atorvastatin (LIPITOR) 40 MG tablet Take 40 mg by mouth  daily.    [provider]  cholecalciferol (VITAMIN D) 1000 units tablet Take 1,000 Units by mouth daily.     [provider]  ciprofloxacin (CIPRO) 250 MG tablet Take 250 mg by mouth 2 (two) times daily. 02/21/17   [provider]  clopidogrel (PLAVIX) 75 MG tablet Take 75 mg by mouth daily.  04/16/14   [provider]  cyanocobalamin 1000 MCG tablet Take 1,000 mcg by mouth daily.     [provider]  DULoxetine (CYMBALTA) 60 MG capsule Take 60 mg by mouth daily.  04/16/14   [provider]  estradiol (ESTRACE) 0.1 MG/GM vaginal cream Place vaginally. 12/29/15   [provider]  glimepiride (AMARYL) 4 MG tablet Take 2-4 mg by mouth 2 (two) times daily. 2 mg in the morning and 4 mg at night    [provider]  hydrochlorothiazide (HYDRODIURIL) 25 MG tablet Take 25 mg by mouth daily.  04/16/14   [provider]  insulin glargine (LANTUS) 100 unit/mL SOPN Inject 36 Units into the skin at bedtime.    [provider]  JANUVIA 100 MG tablet Take 100 mg by mouth daily.  04/22/14   [provider]  lamoTRIgine (LAMICTAL) 100 MG tablet Take 100 mg by mouth 2 (two) times daily.    [provider]  Magnesium Gluconate 550 MG TABS Take 30 mg by mouth.  [provider]  magnesium oxide (MAG-OX) 400 MG tablet Take by mouth. 01/10/17   [provider]  Magnesium Oxide -Mg Supplement 400 MG CAPS Take 400 mg by mouth daily.     [provider]  meclizine (ANTIVERT) 12.5 MG tablet Take 12.5 mg by mouth 3 (three) times daily as needed for dizziness.    [provider]  metoprolol succinate (TOPROL-XL) 25 MG 24 hr tablet Take 25 mg by mouth daily.  04/16/14   [provider]  mirabegron ER (MYRBETRIQ) 50 MG TB24 tablet Take 50 mg by mouth daily.    [provider]  naproxen sodium (ANAPROX) 220 MG tablet Take 220 mg by mouth 2 (two) times daily as needed (pain).  Reported on 02/13/2016    [provider]  ONE TOUCH ULTRA TEST test strip  03/12/14   [provider]  oxybutynin (DITROPAN-XL) 10 MG 24 hr tablet Take 10 mg by mouth at bedtime.    [provider]  pantoprazole (PROTONIX) 40 MG tablet Take 40 mg by mouth daily.    [provider]  verapamil (CALAN-SR) 180 MG CR tablet Take 360 mg by mouth daily.  04/16/14   [provider]    Allergies Amoxicillin and Lidocaine  Family History  Problem Relation Age of Onset  . Colon cancer Father   . Kidney cancer Neg Hx   . Bladder Cancer Neg Hx     Social History Social History  Substance Use Topics  . Smoking status: Never Smoker  . Smokeless tobacco: Never Used  . Alcohol use 0.0 oz/week     Comment: once a quarter    Review of Systems Constitutional: No fever/chills Eyes: No visual changes. ENT: No sore throat. Cardiovascular: Denies chest pain. Respiratory: Denies shortness of breath. Gastrointestinal: No abdominal pain.  No nausea, no vomiting.  No diarrhea.  No constipation. Genitourinary: Negative for dysuria. Musculoskeletal: Negative for neck pain.  Negative for back pain. Integumentary: Negative for rash. Neurological: Negative for headaches, focal weakness or numbness.  ____________________________________________   PHYSICAL EXAM:  VITAL SIGNS: ED Triage Vitals  Enc Vitals Group     BP 04/15/17 0423 119/70     Pulse Rate 04/15/17 0423 70     Resp 04/15/17 0423 20     Temp 04/15/17 0423 97.8 F (36.6 C)     Temp Source 04/15/17 0423 Oral     SpO2 04/15/17 0423 97 %     Weight 04/15/17 0424 88 kg (194 lb)     Height 04/15/17 0424 1.676 m (5\' 6" )     Head Circumference --      Peak Flow --      Pain Score 04/15/17 0423 2     Pain Loc --      Pain Edu? --      Excl. in [employer]? --     Constitutional: Alert and oriented. Well appearing and in no acute distress. Eyes: Conjunctivae are normal. PERRL. EOMI. Head: Right  occipital scalp hematoma noted Mouth/Throat: Mucous membranes are moist. Neck: No stridor.  No cervical spine tenderness to palpation Cardiovascular: Normal rate, regular rhythm. Good peripheral circulation. Grossly normal heart sounds. Respiratory: Normal respiratory effort.  No retractions. Lungs CTAB. Gastrointestinal: Soft and nontender. No distention.  Musculoskeletal: No lower extremity tenderness nor edema. No gross deformities of extremities. Neurologic:  Normal speech and language. No gross focal neurologic deficits are appreciated.  Skin:  Occipital scalp contusion Psychiatric: Mood and affect are normal. Speech and  behavior are normal.  ____________________________________________   LABS (all labs ordered are listed, but only abnormal results are displayed)  Labs Reviewed  BASIC METABOLIC PANEL - Abnormal; Notable for the following:       Result Value   Potassium 3.3 (*)    Glucose, Bld 109 (*)    Creatinine, Ser 1.21 (*)    GFR calc non Af Amer 42 (*)    GFR calc Af Amer 49 (*)    All other components within normal limits  CBC - Abnormal; Notable for the following:    RBC 5.21 (*)    MCV 77.2 (*)    MCH 24.4 (*)    MCHC 31.7 (*)    RDW 17.9 (*)    All other components within normal limits  URINALYSIS, COMPLETE (UACMP) WITH MICROSCOPIC - Abnormal; Notable for the following:    Color, Urine AMBER (*)    APPearance TURBID (*)    Glucose, UA 50 (*)    Leukocytes, UA LARGE (*)    Squamous Epithelial / LPF 6-30 (*)    Non Squamous Epithelial 0-5 (*)    All other components within normal limits  URINE CULTURE  TROPONIN I   ____________________________________________  EKG  ED ECG REPORT I, Wilson N Malikye Reppond, the attending physician, personally viewed and interpreted this ECG.   Date: 04/15/2017  EKG Time: 4:24AM  Rate: 69  Rhythm: Normal Sinus Rhythm  Axis: Normal   Intervals:Normal  ST&T Change:  None  ____________________________________________  RADIOLOGY I, [place] N Chandi Nicklin, personally viewed and evaluated these images (plain radiographs) as part of my medical decision making, as well as reviewing the written report by the radiologist.  Ct Head Wo Contrast  Result Date: 04/15/2017 CLINICAL DATA:  Status post fall, with hematoma at the back of the head, and right-sided neck pain. Initial encounter. EXAM: CT HEAD WITHOUT CONTRAST CT CERVICAL SPINE WITHOUT CONTRAST TECHNIQUE: Multidetector CT imaging of the head and cervical spine was performed following the standard protocol without intravenous contrast. Multiplanar CT image reconstructions of the cervical spine were also generated. COMPARISON:  CT of the head and MRI of the brain performed 02/27/2017 FINDINGS: CT HEAD FINDINGS Brain: No evidence of acute infarction, hemorrhage, hydrocephalus, extra-axial collection or mass lesion/mass effect. Prominence of the ventricles and sulci reflects moderate cortical volume loss. Mild cerebellar atrophy is noted. Scattered periventricular white matter change likely reflects small vessel ischemic microangiopathy. Small chronic lacunar infarcts are seen at the basal ganglia bilaterally. The brainstem and fourth ventricle are within normal limits. The cerebral hemispheres demonstrate grossly normal gray-white differentiation. No mass effect or midline shift is seen. Vascular: No hyperdense vessel or unexpected calcification. Skull: There is no evidence of fracture; visualized osseous structures are unremarkable in appearance. Sinuses/Orbits: The orbits are within normal limits. The paranasal sinuses and mastoid air cells are well-aerated. Other: Soft tissue swelling is noted at the right posterior vertex. CT CERVICAL SPINE FINDINGS Alignment: Normal. Skull base and vertebrae: No acute fracture. No primary bone lesion or focal pathologic process. Soft tissues and spinal canal: No prevertebral fluid or swelling.  No visible canal hematoma. Disc levels: Visualized intervertebral disc spaces are preserved. C7-T1 is incompletely imaged on this study. Mild facet disease is noted along the cervical spine. The bony foramina are grossly unremarkable. Upper chest: Mild calcification is noted at the carotid bifurcations bilaterally. The visualized portions of the thyroid gland are grossly unremarkable. Other: No additional soft tissue abnormalities are seen. IMPRESSION: 1. No evidence of traumatic intracranial  injury or fracture. 2. No evidence of fracture or subluxation along the cervical spine. 3. Soft tissue swelling at the right posterior vertex. 4. Moderate cortical volume loss and scattered small vessel ischemic microangiopathy. 5. Small chronic lacunar infarcts at the basal ganglia bilaterally. 6. Mild calcification at the carotid bifurcations bilaterally. Electronically Signed   By: Garald Balding M.D.   On: 04/15/2017 05:25   Ct Cervical Spine Wo Contrast  Result Date: 04/15/2017 CLINICAL DATA:  Status post fall, with hematoma at the back of the head, and right-sided neck pain. Initial encounter. EXAM: CT HEAD WITHOUT CONTRAST CT CERVICAL SPINE WITHOUT CONTRAST TECHNIQUE: Multidetector CT imaging of the head and cervical spine was performed following the standard protocol without intravenous contrast. Multiplanar CT image reconstructions of the cervical spine were also generated. COMPARISON:  CT of the head and MRI of the brain performed 02/27/2017 FINDINGS: CT HEAD FINDINGS Brain: No evidence of acute infarction, hemorrhage, hydrocephalus, extra-axial collection or mass lesion/mass effect. Prominence of the ventricles and sulci reflects moderate cortical volume loss. Mild cerebellar atrophy is noted. Scattered periventricular white matter change likely reflects small vessel ischemic microangiopathy. Small chronic lacunar infarcts are seen at the basal ganglia bilaterally. The brainstem and fourth ventricle are within  normal limits. The cerebral hemispheres demonstrate grossly normal gray-white differentiation. No mass effect or midline shift is seen. Vascular: No hyperdense vessel or unexpected calcification. Skull: There is no evidence of fracture; visualized osseous structures are unremarkable in appearance. Sinuses/Orbits: The orbits are within normal limits. The paranasal sinuses and mastoid air cells are well-aerated. Other: Soft tissue swelling is noted at the right posterior vertex. CT CERVICAL SPINE FINDINGS Alignment: Normal. Skull base and vertebrae: No acute fracture. No primary bone lesion or focal pathologic process. Soft tissues and spinal canal: No prevertebral fluid or swelling. No visible canal hematoma. Disc levels: Visualized intervertebral disc spaces are preserved. C7-T1 is incompletely imaged on this study. Mild facet disease is noted along the cervical spine. The bony foramina are grossly unremarkable. Upper chest: Mild calcification is noted at the carotid bifurcations bilaterally. The visualized portions of the thyroid gland are grossly unremarkable. Other: No additional soft tissue abnormalities are seen. IMPRESSION: 1. No evidence of traumatic intracranial injury or fracture. 2. No evidence of fracture or subluxation along the cervical spine. 3. Soft tissue swelling at the right posterior vertex. 4. Moderate cortical volume loss and scattered small vessel ischemic microangiopathy. 5. Small chronic lacunar infarcts at the basal ganglia bilaterally. 6. Mild calcification at the carotid bifurcations bilaterally. Electronically Signed   By: Garald Balding M.D.   On: 04/15/2017 05:25   Dg Chest Port 1 View  Result Date: 04/15/2017 CLINICAL DATA:  Status post fall, with concern for chest injury. Initial encounter. EXAM: PORTABLE CHEST 1 VIEW COMPARISON:  Chest radiograph performed 02/27/2017 FINDINGS: The lungs are well-aerated and clear. There is no evidence of focal opacification, pleural effusion or  pneumothorax. The cardiomediastinal silhouette is within normal limits. No acute osseous abnormalities are seen. IMPRESSION: No acute cardiopulmonary process seen. No displaced rib fractures identified. Electronically Signed   By: Garald Balding M.D.   On: 04/15/2017 05:26     Procedures   ____________________________________________   INITIAL IMPRESSION / ASSESSMENT AND PLAN / ED COURSE  Pertinent labs & imaging results that were available during my care of the patient were reviewed by me and considered in my medical decision making (see chart for details).  76 year old lady presenting after accidental fall with scalp contusion. Patient underwent  a urinary tract infection. Patient given Keflex for home ceftriaxone 1 g dose given in the emergency department.      ____________________________________________  FINAL CLINICAL IMPRESSION(S) / ED DIAGNOSES  Final diagnoses:  Fall, initial encounter  Contusion of scalp, initial encounter  Acute cystitis without hematuria     MEDICATIONS GIVEN DURING THIS VISIT:  Medications  cefTRIAXone (ROCEPHIN) 1 g in dextrose 5 % 50 mL IVPB (0 g Intravenous Stopped 04/15/17 0630)     NEW OUTPATIENT MEDICATIONS STARTED DURING THIS VISIT:  New Prescriptions   No medications on file    Modified Medications   No medications on file    Discontinued Medications   No medications on file     Note:  This document was prepared using Dragon voice recognition software and may include unintentional dictation errors.    Gregor Hams, MD 04/15/17 [phone]

## 2017-04-15 NOTE — ED Notes (Signed)
Signature pad not working, pt and spouse verbalize understanding of discharge and follow up instructions

## 2017-04-16 LAB — URINE CULTURE

## 2017-04-22 ENCOUNTER — Emergency Department: Payer: Medicare Other

## 2017-04-22 ENCOUNTER — Encounter: Payer: Self-pay | Admitting: Emergency Medicine

## 2017-04-22 ENCOUNTER — Observation Stay: Payer: Medicare Other

## 2017-04-22 ENCOUNTER — Observation Stay
Admission: EM | Admit: 2017-04-22 | Discharge: 2017-04-23 | Disposition: A | Discharge status: Home / Self Care | Payer: Medicare Other | Attending: Internal Medicine | Admitting: Internal Medicine

## 2017-04-22 DIAGNOSIS — I10 Essential (primary) hypertension: Secondary | ICD-10-CM | POA: Insufficient documentation

## 2017-04-22 DIAGNOSIS — F329 Major depressive disorder, single episode, unspecified: Secondary | ICD-10-CM | POA: Insufficient documentation

## 2017-04-22 DIAGNOSIS — R42 Dizziness and giddiness: Secondary | ICD-10-CM | POA: Diagnosis not present

## 2017-04-22 DIAGNOSIS — Z9181 History of falling: Secondary | ICD-10-CM | POA: Diagnosis not present

## 2017-04-22 DIAGNOSIS — E119 Type 2 diabetes mellitus without complications: Secondary | ICD-10-CM | POA: Diagnosis not present

## 2017-04-22 DIAGNOSIS — Z8673 Personal history of transient ischemic attack (TIA), and cerebral infarction without residual deficits: Secondary | ICD-10-CM | POA: Insufficient documentation

## 2017-04-22 DIAGNOSIS — R531 Weakness: Secondary | ICD-10-CM | POA: Diagnosis not present

## 2017-04-22 DIAGNOSIS — Z79899 Other long term (current) drug therapy: Secondary | ICD-10-CM | POA: Insufficient documentation

## 2017-04-22 DIAGNOSIS — E7800 Pure hypercholesterolemia, unspecified: Secondary | ICD-10-CM | POA: Diagnosis not present

## 2017-04-22 DIAGNOSIS — Z794 Long term (current) use of insulin: Secondary | ICD-10-CM | POA: Diagnosis not present

## 2017-04-22 DIAGNOSIS — K219 Gastro-esophageal reflux disease without esophagitis: Secondary | ICD-10-CM | POA: Diagnosis not present

## 2017-04-22 DIAGNOSIS — G4730 Sleep apnea, unspecified: Secondary | ICD-10-CM | POA: Insufficient documentation

## 2017-04-22 DIAGNOSIS — F419 Anxiety disorder, unspecified: Secondary | ICD-10-CM | POA: Insufficient documentation

## 2017-04-22 DIAGNOSIS — I471 Supraventricular tachycardia: Secondary | ICD-10-CM | POA: Insufficient documentation

## 2017-04-22 DIAGNOSIS — Z7902 Long term (current) use of antithrombotics/antiplatelets: Secondary | ICD-10-CM | POA: Diagnosis not present

## 2017-04-22 DIAGNOSIS — R4182 Altered mental status, unspecified: Secondary | ICD-10-CM

## 2017-04-22 LAB — BASIC METABOLIC PANEL
ANION GAP: 9 (ref 5–15)
BUN: 15 mg/dL (ref 6–20)
CALCIUM: 9.8 mg/dL (ref 8.9–10.3)
CO2: 26 mmol/L (ref 22–32)
CREATININE: 0.94 mg/dL (ref 0.44–1.00)
Chloride: 103 mmol/L (ref 101–111)
GFR calc Af Amer: >60 mL/min (ref >60)
GFR, EST NON AFRICAN AMERICAN: 57 mL/min — AB (ref >60)
GLUCOSE: 107 mg/dL — AB (ref 65–99)
POTASSIUM: 3.8 mmol/L (ref 3.5–5.1)
SODIUM: 138 mmol/L (ref 135–145)

## 2017-04-22 LAB — URINALYSIS, COMPLETE (UACMP) WITH MICROSCOPIC
Bacteria, UA: NONE SEEN
Bilirubin Urine: NEGATIVE
GLUCOSE, UA: NEGATIVE mg/dL
HGB URINE DIPSTICK: NEGATIVE
Ketones, ur: NEGATIVE mg/dL
NITRITE: NEGATIVE
Protein, ur: NEGATIVE mg/dL
SPECIFIC GRAVITY, URINE: 1.012 (ref 1.005–1.030)
pH: 6 (ref 5.0–8.0)

## 2017-04-22 LAB — TROPONIN I

## 2017-04-22 LAB — HEPATIC FUNCTION PANEL
ALBUMIN: 3.8 g/dL (ref 3.5–5.0)
ALK PHOS: 50 U/L (ref 38–126)
ALT: 17 U/L (ref 14–54)
AST: 22 U/L (ref 15–41)
BILIRUBIN TOTAL: 0.6 mg/dL (ref 0.3–1.2)
Bilirubin, Direct: <0.1 mg/dL — ABNORMAL LOW (ref 0.1–0.5)
Total Protein: 7.2 g/dL (ref 6.5–8.1)

## 2017-04-22 LAB — CBC
HCT: 36.6 % (ref 35.0–47.0)
Hemoglobin: 11.8 g/dL — ABNORMAL LOW (ref 12.0–16.0)
MCH: 24.5 pg — ABNORMAL LOW (ref 26.0–34.0)
MCHC: 32.2 g/dL (ref 32.0–36.0)
MCV: 76.1 fL — ABNORMAL LOW (ref 80.0–100.0)
PLATELETS: 397 10*3/uL (ref 150–440)
RBC: 4.81 MIL/uL (ref 3.80–5.20)
RDW: 18.4 % — AB (ref 11.5–14.5)
WBC: 9.4 10*3/uL (ref 3.6–11.0)

## 2017-04-22 LAB — GLUCOSE, CAPILLARY
GLUCOSE-CAPILLARY: 191 mg/dL — AB (ref 65–99)
Glucose-Capillary: 97 mg/dL (ref 65–99)

## 2017-04-22 MED ORDER — OXYBUTYNIN CHLORIDE ER 10 MG PO TB24
10.0000 mg | ORAL_TABLET | Freq: Every day | ORAL | Status: DC
  Administered 2017-04-22: 10 mg via ORAL
  Filled 2017-04-22 (×2): qty 1

## 2017-04-22 MED ORDER — ONDANSETRON HCL 4 MG/2ML IJ SOLN: 4.0000 mg | Freq: Four times a day (QID) | INTRAMUSCULAR | Status: DC | PRN

## 2017-04-22 MED ORDER — METOPROLOL SUCCINATE ER 25 MG PO TB24
25.0000 mg | ORAL_TABLET | Freq: Every day | ORAL | Status: DC
  Administered 2017-04-23: 09:00:00 25 mg via ORAL
  Filled 2017-04-22: qty 1

## 2017-04-22 MED ORDER — INSULIN GLARGINE 100 UNIT/ML ~~LOC~~ SOLN
36.0000 [IU] | Freq: Every day | SUBCUTANEOUS | Status: DC
  Administered 2017-04-22: 36 [IU] via SUBCUTANEOUS
  Filled 2017-04-22 (×2): qty 0.36

## 2017-04-22 MED ORDER — PANTOPRAZOLE SODIUM 40 MG PO TBEC
40.0000 mg | DELAYED_RELEASE_TABLET | Freq: Every day | ORAL | Status: DC
  Administered 2017-04-23: 09:00:00 40 mg via ORAL
  Filled 2017-04-22: qty 1

## 2017-04-22 MED ORDER — ASPIRIN EC 81 MG PO TBEC
81.0000 mg | DELAYED_RELEASE_TABLET | Freq: Every day | ORAL | Status: DC
  Administered 2017-04-22 – 2017-04-23 (×2): 81 mg via ORAL
  Filled 2017-04-22 (×2): qty 1

## 2017-04-22 MED ORDER — GLIMEPIRIDE 2 MG PO TABS
4.0000 mg | ORAL_TABLET | Freq: Every day | ORAL | Status: DC
  Administered 2017-04-22 – 2017-04-23 (×2): 4 mg via ORAL
  Filled 2017-04-22: qty 2
  Filled 2017-04-22: qty 1

## 2017-04-22 MED ORDER — ATORVASTATIN CALCIUM 20 MG PO TABS
40.0000 mg | ORAL_TABLET | Freq: Every day | ORAL | Status: DC
  Administered 2017-04-22 – 2017-04-23 (×2): 40 mg via ORAL
  Filled 2017-04-22 (×2): qty 2

## 2017-04-22 MED ORDER — ONDANSETRON HCL 4 MG PO TABS: 4.0000 mg | ORAL_TABLET | Freq: Four times a day (QID) | ORAL | Status: DC | PRN

## 2017-04-22 MED ORDER — DULOXETINE HCL 30 MG PO CPEP
60.0000 mg | ORAL_CAPSULE | Freq: Every day | ORAL | Status: DC
  Administered 2017-04-23: 09:00:00 60 mg via ORAL
  Filled 2017-04-22: qty 2

## 2017-04-22 MED ORDER — SODIUM CHLORIDE 0.9 % IV SOLN
Freq: Once | INTRAVENOUS | Status: AC
  Administered 2017-04-22: 1000 mL via INTRAVENOUS

## 2017-04-22 MED ORDER — MAGNESIUM OXIDE 400 (241.3 MG) MG PO TABS
400.0000 mg | ORAL_TABLET | Freq: Every day | ORAL | Status: DC
  Administered 2017-04-23: 400 mg via ORAL
  Filled 2017-04-22: qty 1

## 2017-04-22 MED ORDER — BUPROPION HCL ER (XL) 150 MG PO TB24
150.0000 mg | ORAL_TABLET | Freq: Every day | ORAL | Status: DC
  Administered 2017-04-22 – 2017-04-23 (×2): 150 mg via ORAL
  Filled 2017-04-22 (×2): qty 1

## 2017-04-22 MED ORDER — CHOLECALCIFEROL 10 MCG (400 UNIT) PO TABS
400.0000 [IU] | ORAL_TABLET | Freq: Every day | ORAL | Status: DC
  Administered 2017-04-23: 400 [IU] via ORAL
  Filled 2017-04-22: qty 1

## 2017-04-22 MED ORDER — LINAGLIPTIN 5 MG PO TABS
5.0000 mg | ORAL_TABLET | Freq: Every day | ORAL | Status: DC
  Administered 2017-04-22 – 2017-04-23 (×2): 5 mg via ORAL
  Filled 2017-04-22 (×2): qty 1

## 2017-04-22 MED ORDER — CLOPIDOGREL BISULFATE 75 MG PO TABS
75.0000 mg | ORAL_TABLET | Freq: Every day | ORAL | Status: DC
  Administered 2017-04-23: 09:00:00 75 mg via ORAL
  Filled 2017-04-22: qty 1

## 2017-04-22 MED ORDER — VERAPAMIL HCL ER 180 MG PO TBCR
180.0000 mg | EXTENDED_RELEASE_TABLET | Freq: Every day | ORAL | Status: DC
  Administered 2017-04-23: 09:00:00 180 mg via ORAL
  Filled 2017-04-22 (×2): qty 1

## 2017-04-22 MED ORDER — ACETAMINOPHEN 650 MG RE SUPP: 650.0000 mg | Freq: Four times a day (QID) | RECTAL | Status: DC | PRN

## 2017-04-22 MED ORDER — SODIUM CHLORIDE 0.9 % IV SOLN
INTRAVENOUS | Status: DC
  Administered 2017-04-22 – 2017-04-23 (×2): via INTRAVENOUS

## 2017-04-22 MED ORDER — INSULIN ASPART 100 UNIT/ML ~~LOC~~ SOLN
0.0000 [IU] | Freq: Three times a day (TID) | SUBCUTANEOUS | Status: DC
  Administered 2017-04-23: 2 [IU] via SUBCUTANEOUS
  Filled 2017-04-22: qty 1

## 2017-04-22 MED ORDER — LAMOTRIGINE 100 MG PO TABS
100.0000 mg | ORAL_TABLET | Freq: Every day | ORAL | Status: DC
  Administered 2017-04-22 – 2017-04-23 (×2): 100 mg via ORAL
  Filled 2017-04-22 (×2): qty 1

## 2017-04-22 MED ORDER — VITAMIN B-12 1000 MCG PO TABS
500.0000 ug | ORAL_TABLET | Freq: Every day | ORAL | Status: DC
  Administered 2017-04-23: 09:00:00 500 ug via ORAL
  Filled 2017-04-22: qty 1

## 2017-04-22 MED ORDER — ACETAMINOPHEN 325 MG PO TABS: 650.0000 mg | ORAL_TABLET | Freq: Four times a day (QID) | ORAL | Status: DC | PRN

## 2017-04-22 NOTE — H&P (Signed)
Kristen Guerra is an 76 y.o. female.   Chief Complaint: Weakness and dizziness HPI: This is 76 year old female who's had a history of a CVA about 2 months ago. Her complaint at that time was dizziness. However the location of the CVA was not an area that would cause dizziness. Since then she's continued to have on and off dizzy spells and falling. She says mainly happens when she gets up. However sometimes walking with a walker she will feel dizzy and fall. She was seen by neurology on her last inpatient admission and they thought the dizziness was positional. However she had no further episodes during that admission was not placed on any medications. She was seen last couple days by Dr. Melrose Nakayama as an outpatient but documentation is not available yet. Patient says she was told nothing was wrong with her. However she continues to have dizziness here in the ER. No other deficits. He also states she just feels generalized weakness. And she not has not been eating very well last few days. She denies any nausea or vomiting  Past Medical History:  Diagnosis Date  . Anxiety   . Depression   . Diabetes mellitus without complication ([place])   . GERD (gastroesophageal reflux disease)   . High cholesterol   . Hypertension   . PSVT (paroxysmal supraventricular tachycardia) ([place])   . Sleep apnea   . Stroke ([place])   . SUI (stress urinary incontinence, female)     Past Surgical History:  Procedure Laterality Date  . ABDOMINAL HYSTERECTOMY    . BREAST BIOPSY Right 1977   negative  . BREAST BIOPSY Right 1988   negative  . BREAST SURGERY    . CHOLECYSTECTOMY    . EYE SURGERY     cataract  . GALLBLADDER SURGERY      Family History  Problem Relation Age of Onset  . Colon cancer Father   . Kidney cancer Neg Hx   . Bladder Cancer Neg Hx    Social History:  reports that she has never smoked. She has never used smokeless tobacco. She reports that she drinks alcohol. She reports that she does not use  drugs.  Allergies:  Allergies  Allergen Reactions  . Amoxicillin Itching and Other (See Comments)    Reaction: dizziness Has patient had a PCN reaction causing immediate rash, facial/tongue/throat swelling, SOB or lightheadedness with hypotension: No Has patient had a PCN reaction causing severe rash involving mucus membranes or skin necrosis: No Has patient had a PCN reaction that required hospitalization: No Has patient had a PCN reaction occurring within the last 10 years: Yes If all of the above answers are "NO", then may proceed with Cephalosporin use.   . Lidocaine Itching     (Not in a hospital admission)  Results for orders placed or performed during the hospital encounter of 04/22/17 (from the past 48 hour(s))  Basic metabolic panel     Status: Abnormal   Collection Time: 04/22/17 11:02 AM  Result Value Ref Range   Sodium 138 135 - 145 mmol/L   Potassium 3.8 3.5 - 5.1 mmol/L   Chloride 103 101 - 111 mmol/L   CO2 26 22 - 32 mmol/L   Glucose, Bld 107 (H) 65 - 99 mg/dL   BUN 15 6 - 20 mg/dL   Creatinine, Ser 0.94 0.44 - 1.00 mg/dL   Calcium 9.8 8.9 - 10.3 mg/dL   GFR calc non Af Amer 57 (L) >60 mL/min   GFR calc Af Amer >  60 >60 mL/min    Comment: (NOTE) The eGFR has been calculated using the CKD EPI equation. This calculation has not been validated in all clinical situations. eGFR's persistently <60 mL/min signify possible Chronic Kidney Disease.    Anion gap 9 5 - 15  CBC     Status: Abnormal   Collection Time: 04/22/17 11:02 AM  Result Value Ref Range   WBC 9.4 3.6 - 11.0 K/uL   RBC 4.81 3.80 - 5.20 MIL/uL   Hemoglobin 11.8 (L) 12.0 - 16.0 g/dL   HCT 36.6 35.0 - 47.0 %   MCV 76.1 (L) 80.0 - 100.0 fL   MCH 24.5 (L) 26.0 - 34.0 pg   MCHC 32.2 32.0 - 36.0 g/dL   RDW 18.4 (H) 11.5 - 14.5 %   Platelets 397 150 - 440 K/uL  Troponin I     Status: None   Collection Time: 04/22/17 11:13 AM  Result Value Ref Range   Troponin I <0.03 <0.03 ng/mL  Hepatic function  panel     Status: Abnormal   Collection Time: 04/22/17 11:13 AM  Result Value Ref Range   Total Protein 7.2 6.5 - 8.1 g/dL   Albumin 3.8 3.5 - 5.0 g/dL   AST 22 15 - 41 U/L   ALT 17 14 - 54 U/L   Alkaline Phosphatase 50 38 - 126 U/L   Total Bilirubin 0.6 0.3 - 1.2 mg/dL   Bilirubin, Direct <0.1 (L) 0.1 - 0.5 mg/dL   Indirect Bilirubin NOT CALCULATED 0.3 - 0.9 mg/dL  Urinalysis, Complete w Microscopic     Status: Abnormal   Collection Time: 04/22/17 12:56 PM  Result Value Ref Range   Color, Urine YELLOW (A) YELLOW   APPearance CLEAR (A) CLEAR   Specific Gravity, Urine 1.012 1.005 - 1.030   pH 6.0 5.0 - 8.0   Glucose, UA NEGATIVE NEGATIVE mg/dL   Hgb urine dipstick NEGATIVE NEGATIVE   Bilirubin Urine NEGATIVE NEGATIVE   Ketones, ur NEGATIVE NEGATIVE mg/dL   Protein, ur NEGATIVE NEGATIVE mg/dL   Nitrite NEGATIVE NEGATIVE   Leukocytes, UA SMALL (A) NEGATIVE   RBC / HPF 0-5 0 - 5 RBC/hpf   WBC, UA 0-5 0 - 5 WBC/hpf   Bacteria, UA NONE SEEN NONE SEEN   Squamous Epithelial / LPF 0-5 (A) NONE SEEN   Mucus PRESENT    Ct Head Wo Contrast  Result Date: 04/22/2017 CLINICAL DATA:  Altered mental status with confusion. Slurred speech. EXAM: CT HEAD WITHOUT CONTRAST TECHNIQUE: Contiguous axial images were obtained from the base of the skull through the vertex without intravenous contrast. COMPARISON:  April 15, 2017 FINDINGS: Brain: Mild to moderate atrophy is stable. There is no intracranial mass, hemorrhage, extra-axial fluid collection, or midline shift. There is patchy small vessel disease in the centra semiovale bilaterally. There is a prior lacunar infarct in the posterior right lentiform nucleus, stable. There is no new gray-white compartment lesion. No evident acute infarct. Vascular: No hyperdense vessel. There is calcification in each carotid siphon. There is also calcification in the distal right vertebral artery. Skull: Bony calvarium appears intact. Sinuses/Orbits: Visualized  paranasal sinuses are clear. Visualized orbits appear symmetric bilaterally. Other: Visualized mastoid air cells are clear. There is debris in each external auditory canal. IMPRESSION: Stable atrophy. No intracranial mass, hemorrhage, or extra-axial fluid collection. There is stable patchy small vessel disease in the periventricular white matter. Prior small infarct in the posterior right lentiform nucleus. No evident acute infarct. There are foci of  arterial vascular calcification. There is probable cerumen in each external auditory canal. Electronically Signed   By: Lowella Grip III M.D.   On: 04/22/2017 13:26    Review of Systems  Constitutional: Negative for chills and fever.  HENT: Negative for hearing loss.   Eyes: Negative for blurred vision.  Respiratory: Negative for cough.   Cardiovascular: Negative for chest pain.  Gastrointestinal: Negative for nausea and vomiting.  Genitourinary: Negative for dysuria.  Musculoskeletal: Negative for joint pain.  Skin: Negative for rash.  Neurological: Positive for dizziness.    Blood pressure (!) 109/53, pulse 72, temperature 97.8 F (36.6 C), temperature source Oral, resp. rate (!) 21, weight 88 kg (194 lb), SpO2 98 %. Physical Exam  Constitutional: She is oriented to person, place, and time. She appears well-developed and well-nourished. No distress.  HENT:  Head: Normocephalic and atraumatic.  Mouth/Throat: Oropharynx is clear and moist. No oropharyngeal exudate.  Eyes: Pupils are equal, round, and reactive to light. EOM are normal. No scleral icterus.  Neck: Normal range of motion. Neck supple. No JVD present. No tracheal deviation present. No thyromegaly present.  Cardiovascular: Normal rate and regular rhythm.   No murmur heard. Respiratory: Effort normal and breath sounds normal. She exhibits no tenderness.  GI: Soft. Bowel sounds are normal. She exhibits no mass. There is no tenderness.  Musculoskeletal: Normal range of motion.  She exhibits no edema.  Lymphadenopathy:    She has no cervical adenopathy.  Neurological: She is alert and oriented to person, place, and time. No cranial nerve deficit.  Skin: Skin is warm and dry.     Assessment/Plan 1. Generalized weakness. Patient looks mildly dehydrated. May be from poor by mouth intake. Going give her IV fluids and get physical therapy to see her to evaluate. No sign of infection. 2. Dizziness. Does not sound like true vertigo from what she describes. She had dizziness on her last admission but MRI did not show any cause of this although it did show a new stroke at that time. With her history of a recent episode we'll go ahead and repeat the MRI to rule out central cause. This is negative then may consider putting benign positional vertigo and putting her on meclizine for treatment. 3. Hypertension. Patient is actually running a little lower than normal blood pressure with systolic in the low 224V. With her looking clinically dehydrated I will going to hold her hydrochlorothiazide for now. Get orthostatics every shift. She was not orthostatic here in the ER. 4. History of CVA. She is on Plavix and Lipitor. Will continue those. Again on a repeat MRI.  Total time spent 45 minutes  Baxter Hire, MD 04/22/2017, 3:48 PM

## 2017-04-22 NOTE — ED Notes (Signed)
Patient transported to CT 

## 2017-04-22 NOTE — Progress Notes (Signed)
Writer received a call from [facility] Radiology regarding the MRI results. On call MD pager number provided as requested. Oncoming RN Natasha Bence made aware of above. Madlyn Frankel, RN

## 2017-04-22 NOTE — ED Triage Notes (Signed)
Pt brought in by [facility] from home for weakness and not feeling well for the past month. Pt states that she is unsure why her husband called EMS. Pt reports having lab work done this morning at [facility]. Pt reports that she was seen in the ED 2 days ago because she fell. Pt speech sound slightly slurred on triage. Pt states that she is unsure if this is new for her.

## 2017-04-22 NOTE — ED Notes (Signed)
Pt ambulatory to toilet with moderate assist.

## 2017-04-22 NOTE — ED Notes (Signed)
Pt provided [place] sandwich tray and cup of water upon request.

## 2017-04-22 NOTE — ED Provider Notes (Signed)
[facility] Emergency Department Provider Note       Time seen: ----------------------------------------- 11:11 AM on 04/22/2017 -----------------------------------------     I have reviewed the triage vital signs and the nursing notes.   HISTORY   Chief Complaint Weakness    HPI Kristen Guerra is a 76 y.o. female who presents to the ED for weakness and not feeling well for the past month. Patient states she is unsure why her husband called EMS.Patient reports having blood work done today at [facility]. Patient states she was seen in ER 2 days ago because of a fall. She denies any recent illness or changes in her medicines. She does describe decreased oral intake but denies any focal symptoms.   Past Medical History:  Diagnosis Date  . Anxiety   . Depression   . Diabetes mellitus without complication ([place])   . GERD (gastroesophageal reflux disease)   . High cholesterol   . Hypertension   . PSVT (paroxysmal supraventricular tachycardia) ([place])   . Sleep apnea   . Stroke ([place])   . SUI (stress urinary incontinence, female)     Patient Active Problem List   Diagnosis Date Noted  . Dizziness 02/27/2017  . UTI (urinary tract infection) 02/27/2017  . GERD (gastroesophageal reflux disease) 02/27/2017  . HTN (hypertension) 02/27/2017  . HLD (hyperlipidemia) 02/27/2017  . Diabetes ([place]) 02/27/2017    Past Surgical History:  Procedure Laterality Date  . ABDOMINAL HYSTERECTOMY    . BREAST BIOPSY Right 1977   negative  . BREAST BIOPSY Right 1988   negative  . BREAST SURGERY    . CHOLECYSTECTOMY    . EYE SURGERY     cataract  . GALLBLADDER SURGERY      Allergies Amoxicillin and Lidocaine  Social History Social History  Substance Use Topics  . Smoking status: Never Smoker  . Smokeless tobacco: Never Used  . Alcohol use 0.0 oz/week     Comment: once a quarter    Review of Systems Constitutional: Negative for fever. Eyes:  Negative for vision changes ENT:  Negative for congestion, sore throat Cardiovascular: Negative for chest pain. Respiratory: Negative for shortness of breath. Gastrointestinal: Negative for abdominal pain, vomiting and diarrhea. Genitourinary: Negative for dysuria. Musculoskeletal: Negative for back pain. Skin: Negative for rash. Neurological: positive for generalized weakness  All systems negative/normal/unremarkable except as stated in the HPI  ____________________________________________   PHYSICAL EXAM:  VITAL SIGNS: ED Triage Vitals  Enc Vitals Group     BP 04/22/17 1056 (!) 103/51     Pulse Rate 04/22/17 1056 65     Resp 04/22/17 1056 18     Temp 04/22/17 1056 97.8 F (36.6 C)     Temp Source 04/22/17 1056 Oral     SpO2 04/22/17 1056 97 %     Weight 04/22/17 1101 194 lb (88 kg)     Height --      Head Circumference --      Peak Flow --      Pain Score --      Pain Loc --      Pain Edu? --      Excl. in [place]? --     Constitutional: Alert and oriented. Well appearing and in no distress. Eyes: Conjunctivae are normal. Normal extraocular movements. ENT   Head: Normocephalic and atraumatic.   Nose: No congestion/rhinnorhea.   Mouth/Throat: Mucous membranes are dry   Neck: No stridor. Cardiovascular: Normal rate, regular rhythm. No murmurs, rubs, or  gallops. Respiratory: Normal respiratory effort without tachypnea nor retractions. Breath sounds are clear and equal bilaterally. No wheezes/rales/rhonchi. Gastrointestinal: Soft and nontender. Normal bowel sounds Musculoskeletal: Nontender with normal range of motion in extremities. No lower extremity tenderness nor edema. Neurologic:  Normal speech and language. No gross focal neurologic deficits are appreciated.  Skin:  Skin is warm, dry and intact. No rash noted. Psychiatric: Mood and affect are normal. Speech and behavior are normal.  ____________________________________________  EKG: Interpreted by  me.sinus rhythm rate of 65 bpm, normal PR interval, normal QRS, normal QT, normal axis.  ____________________________________________  ED COURSE:  Pertinent labs & imaging results that were available during my care of the patient were reviewed by me and considered in my medical decision making (see chart for details). Patient presents for weakness, we will assess with labs and imaging as indicated. Patient had recently had CT imaging of her head and neck as well as a chest x-ray. She was recently found to have a UTI so we will repeat basic labs and give IV fluid. Clinical Course as of Apr 23 1439  Fri Apr 22, 2017  1439 Patient did not have significant orthostatic changes on examination.  [JW]    Clinical Course User Index [JW] Earleen Newport, MD   Procedures ____________________________________________   LABS (pertinent positives/negatives)  Labs Reviewed  BASIC METABOLIC PANEL - Abnormal; Notable for the following:       Result Value   Glucose, Bld 107 (*)    GFR calc non Af Amer 57 (*)    All other components within normal limits  CBC - Abnormal; Notable for the following:    Hemoglobin 11.8 (*)    MCV 76.1 (*)    MCH 24.5 (*)    RDW 18.4 (*)    All other components within normal limits  URINALYSIS, COMPLETE (UACMP) WITH MICROSCOPIC - Abnormal; Notable for the following:    Color, Urine YELLOW (*)    APPearance CLEAR (*)    Leukocytes, UA SMALL (*)    Squamous Epithelial / LPF 0-5 (*)    All other components within normal limits  HEPATIC FUNCTION PANEL - Abnormal; Notable for the following:    Bilirubin, Direct <0.1 (*)    All other components within normal limits  TROPONIN I  CBG MONITORING, ED   CT head IMPRESSION: Stable atrophy. No intracranial mass, hemorrhage, or extra-axial fluid collection. There is stable patchy small vessel disease in the periventricular white matter. Prior small infarct in the posterior right lentiform nucleus. No evident acute  infarct.  There are foci of arterial vascular calcification. There is probable cerumen in each external auditory canal.  ____________________________________________  FINAL ASSESSMENT AND PLAN  Weakness  Plan: Patient's labs were dictated above. Patient had presented for weakness with altered mental status and possible near syncope. She is likely overmedicated and intermittently had low blood pressure while in the ER. We did give IV fluids which shows some improvement in her symptoms. I will discuss with the hospitalist for admission.   Earleen Newport, MD   Note: This note was generated in part or whole with voice recognition software. Voice recognition is usually quite accurate but there are transcription errors that can and very often do occur. I apologize for any typographical errors that were not detected and corrected.     Earleen Newport, MD 04/22/17 [phone]

## 2017-04-23 DIAGNOSIS — R531 Weakness: Secondary | ICD-10-CM | POA: Diagnosis not present

## 2017-04-23 LAB — GLUCOSE, CAPILLARY
GLUCOSE-CAPILLARY: 64 mg/dL — AB (ref 65–99)
GLUCOSE-CAPILLARY: 99 mg/dL (ref 65–99)
GLUCOSE-CAPILLARY: 163 mg/dL — AB (ref 65–99)

## 2017-04-23 LAB — BASIC METABOLIC PANEL
Anion gap: 6 (ref 5–15)
BUN: 12 mg/dL (ref 6–20)
CHLORIDE: 108 mmol/L (ref 101–111)
CO2: 27 mmol/L (ref 22–32)
CREATININE: 1.08 mg/dL — AB (ref 0.44–1.00)
Calcium: 8.9 mg/dL (ref 8.9–10.3)
GFR calc Af Amer: 56 mL/min — ABNORMAL LOW (ref >60)
GFR calc non Af Amer: 49 mL/min — ABNORMAL LOW (ref >60)
GLUCOSE: 63 mg/dL — AB (ref 65–99)
POTASSIUM: 3.3 mmol/L — AB (ref 3.5–5.1)
Sodium: 141 mmol/L (ref 135–145)

## 2017-04-23 LAB — APTT: aPTT: 30 seconds (ref 24–36)

## 2017-04-23 LAB — PROTIME-INR
INR: 1.19
Prothrombin Time: 15.2 seconds (ref 11.4–15.2)

## 2017-04-23 MED ORDER — MECLIZINE HCL 12.5 MG PO TABS
12.5000 mg | ORAL_TABLET | Freq: Two times a day (BID) | ORAL | 0 refills | Status: DC | PRN
Start: 2017-04-23 — End: 2020-01-11

## 2017-04-23 NOTE — Consult Note (Signed)
Reason for Consult: dizziness  Referring Physician: Dr. Darvin Neighbours  CC: dizziness   HPI: Kristen Guerra is an 76 y.o. female who was seen by me on 02/28/17 with symptoms of positional dizziness. At that time was found to have small infarct in the R frontal lobe that was likely subacute.  Her dizziness was transient and only appeared with change in position. Pt was seen by Dr. Melrose Nakayama who suggested home exercises for her to perform.  Now at baseline.    Past Medical History:  Diagnosis Date  . Anxiety   . Depression   . Diabetes mellitus without complication ([place])   . GERD (gastroesophageal reflux disease)   . High cholesterol   . Hypertension   . PSVT (paroxysmal supraventricular tachycardia) ([place])   . Sleep apnea   . Stroke ([place])   . SUI (stress urinary incontinence, female)     Past Surgical History:  Procedure Laterality Date  . ABDOMINAL HYSTERECTOMY    . BREAST BIOPSY Right 1977   negative  . BREAST BIOPSY Right 1988   negative  . BREAST SURGERY    . CHOLECYSTECTOMY    . EYE SURGERY     cataract  . GALLBLADDER SURGERY      Family History  Problem Relation Age of Onset  . Colon cancer Father   . Kidney cancer Neg Hx   . Bladder Cancer Neg Hx     Social History:  reports that she has never smoked. She has never used smokeless tobacco. She reports that she drinks alcohol. She reports that she does not use drugs.  Allergies  Allergen Reactions  . Amoxicillin Itching and Other (See Comments)    Reaction: dizziness Has patient had a PCN reaction causing immediate rash, facial/tongue/throat swelling, SOB or lightheadedness with hypotension: No Has patient had a PCN reaction causing severe rash involving mucus membranes or skin necrosis: No Has patient had a PCN reaction that required hospitalization: No Has patient had a PCN reaction occurring within the last 10 years: Yes If all of the above answers are "NO", then may proceed with Cephalosporin use.   . Lidocaine  Itching    Medications: I have reviewed the patient's current medications.  ROS: History obtained from the patient  General ROS: negative for - chills, fatigue, fever, night sweats, weight gain or weight loss Psychological ROS: negative for - behavioral disorder, hallucinations, memory difficulties, mood swings or suicidal ideation Ophthalmic ROS: negative for - blurry vision, double vision, eye pain or loss of vision ENT ROS: negative for - epistaxis, nasal discharge, oral lesions, sore throat, tinnitus or vertigo Allergy and Immunology ROS: negative for - hives or itchy/watery eyes Hematological and Lymphatic ROS: negative for - bleeding problems, bruising or swollen lymph nodes Endocrine ROS: negative for - galactorrhea, hair pattern changes, polydipsia/polyuria or temperature intolerance Respiratory ROS: negative for - cough, hemoptysis, shortness of breath or wheezing Cardiovascular ROS: negative for - chest pain, dyspnea on exertion, edema or irregular heartbeat Gastrointestinal ROS: negative for - abdominal pain, diarrhea, hematemesis, nausea/vomiting or stool incontinence Genito-Urinary ROS: negative for - dysuria, hematuria, incontinence or urinary frequency/urgency Musculoskeletal ROS: negative for - joint swelling or muscular weakness Neurological ROS: as noted in HPI Dermatological ROS: negative for rash and skin lesion changes  Physical Examination: Blood pressure 113/87, pulse 91, temperature 98 F (36.7 C), temperature source Oral, resp. rate 14, height 5\' 6"  (1.676 m), weight 90.1 kg (198 lb 9.6 oz), SpO2 94 %.    Neurological Examination  Mental Status: Alert, oriented, thought content appropriate.  Speech fluent without evidence of aphasia.  Able to follow 3 step commands without difficulty. Cranial Nerves: II: Discs flat bilaterally; Visual fields grossly normal, pupils equal, round, reactive to light and accommodation III,IV, VI: ptosis not present, extra-ocular  motions intact bilaterally V,VII: smile symmetric, facial light touch sensation normal bilaterally VIII: hearing normal bilaterally IX,X: gag reflex present XI: bilateral shoulder shrug XII: midline tongue extension Motor: Right : Upper extremity   5/5    Left:     Upper extremity   5/5  Lower extremity   5/5     Lower extremity   5/5 Tone and bulk:normal tone throughout; no atrophy noted Sensory: Pinprick and light touch intact throughout, bilaterally Deep Tendon Reflexes: 2+ and symmetric throughout Plantars: Right: downgoing   Left: downgoing Cerebellar: normal finger-to-nose, normal rapid alternating movements and normal heel-to-shin test Gait: not tested      Laboratory Studies:   Basic Metabolic Panel:  Recent Labs Lab 04/22/17 1102 04/23/17 0436  NA 138 141  K 3.8 3.3*  CL 103 108  CO2 26 27  GLUCOSE 107* 63*  BUN 15 12  CREATININE 0.94 1.08*  CALCIUM 9.8 8.9    Liver Function Tests:  Recent Labs Lab 04/22/17 1113  AST 22  ALT 17  ALKPHOS 50  BILITOT 0.6  PROT 7.2  ALBUMIN 3.8   No results for input(s): LIPASE, AMYLASE in the last 168 hours. No results for input(s): AMMONIA in the last 168 hours.  CBC:  Recent Labs Lab 04/22/17 1102  WBC 9.4  HGB 11.8*  HCT 36.6  MCV 76.1*  PLT 397    Cardiac Enzymes:  Recent Labs Lab 04/22/17 1113  TROPONINI <0.03    BNP: Invalid input(s): POCBNP  CBG:  Recent Labs Lab 04/22/17 1729 04/22/17 2001 04/23/17 0743 04/23/17 0814 04/23/17 1143  GLUCAP 191* 97 64* 99 163*    Microbiology: Results for orders placed or performed during the hospital encounter of 04/15/17  Urine culture     Status: Abnormal   Collection Time: 04/15/17  4:40 AM  Result Value Ref Range Status   Specimen Description URINE, RANDOM  Final   Special Requests NONE  Final   Culture MULTIPLE SPECIES PRESENT, SUGGEST RECOLLECTION (A)  Final   Report Status 04/16/2017 FINAL  Final    Coagulation Studies:  Recent  Labs  04/22/17 2344  LABPROT 15.2  INR 1.19    Urinalysis:  Recent Labs Lab 04/22/17 1256  COLORURINE YELLOW*  LABSPEC 1.012  PHURINE 6.0  GLUCOSEU NEGATIVE  HGBUR NEGATIVE  BILIRUBINUR NEGATIVE  KETONESUR NEGATIVE  PROTEINUR NEGATIVE  NITRITE NEGATIVE  LEUKOCYTESUR SMALL*    Lipid Panel:     Component Value Date/Time   CHOL 162 02/28/2017 0531   TRIG 107 02/28/2017 0531   HDL 31 (L) 02/28/2017 0531   CHOLHDL 5.2 02/28/2017 0531   VLDL 21 02/28/2017 0531   LDLCALC 110 (H) 02/28/2017 0531    HgbA1C:  Lab Results  Component Value Date   HGBA1C 7.2 (H) 02/28/2017    Urine Drug Screen:  No results found for: LABOPIA, COCAINSCRNUR, LABBENZ, AMPHETMU, THCU, LABBARB  Alcohol Level: No results for input(s): ETH in the last 168 hours.  Other results: EKG: normal EKG, normal sinus rhythm, unchanged from previous tracings.  Imaging: Ct Head Wo Contrast  Result Date: 04/22/2017 CLINICAL DATA:  Altered mental status with confusion. Slurred speech. EXAM: CT HEAD WITHOUT CONTRAST TECHNIQUE: Contiguous axial images were obtained  from the base of the skull through the vertex without intravenous contrast. COMPARISON:  April 15, 2017 FINDINGS: Brain: Mild to moderate atrophy is stable. There is no intracranial mass, hemorrhage, extra-axial fluid collection, or midline shift. There is patchy small vessel disease in the centra semiovale bilaterally. There is a prior lacunar infarct in the posterior right lentiform nucleus, stable. There is no new gray-white compartment lesion. No evident acute infarct. Vascular: No hyperdense vessel. There is calcification in each carotid siphon. There is also calcification in the distal right vertebral artery. Skull: Bony calvarium appears intact. Sinuses/Orbits: Visualized paranasal sinuses are clear. Visualized orbits appear symmetric bilaterally. Other: Visualized mastoid air cells are clear. There is debris in each external auditory canal.  IMPRESSION: Stable atrophy. No intracranial mass, hemorrhage, or extra-axial fluid collection. There is stable patchy small vessel disease in the periventricular white matter. Prior small infarct in the posterior right lentiform nucleus. No evident acute infarct. There are foci of arterial vascular calcification. There is probable cerumen in each external auditory canal. Electronically Signed   By: Lowella Grip III M.D.   On: 04/22/2017 13:26   Mr Brain Wo Contrast  Addendum Date: 04/22/2017   ADDENDUM REPORT: 04/22/2017 22:36 ADDENDUM: Study discussed by telephone with Dr. Jannifer Franklin On 04/22/2017 at 22:36 . Electronically Signed   By: Genevie Ann M.D.   On: 04/22/2017 22:36   Result Date: 04/22/2017 CLINICAL DATA:  76 year old female with weakness, dizziness, not feeling well. EXAM: MRI HEAD WITHOUT CONTRAST TECHNIQUE: Multiplanar, multiecho pulse sequences of the brain and surrounding structures were obtained without intravenous contrast. COMPARISON:  Head CT without contrast 1315 hours today and earlier. Brain MRI 02/27/2017 and earlier. FINDINGS: Brain: No restricted diffusion to suggest acute infarction. No ventriculomegaly. Cervicomedullary junction and pituitary are within normal limits. There is a small right side subdural hematoma measuring up to 2-3 mm in thickness. Her there is also a trace amount of parafalcine subdural hematoma which seems to beyond the left side (series 8, image 18). These were largely occult on the noncontrast head CT today. There is no significant intracranial mass effect or midline shift at this time. No intraventricular or other acute intracranial hemorrhage identified. Stable chronic micro hemorrhages in the right pons and left cerebellar tonsil. Stable gray and white matter signal throughout the brain. Chronic lacunar infarcts in the basal ganglia and left cerebellar hemisphere. Additional patchy deep gray matter, cerebral white matter and pontine T2 and FLAIR hyperintensity.  Vascular: Major intracranial vascular flow voids are stable. Skull and upper cervical spine: Negative. Visualized bone marrow signal is within normal limits. Sinuses/Orbits: Stable and negative. Other: Visible internal auditory structures appear normal. Mastoids remain clear. Scalp and face soft tissues appear stable and negative. IMPRESSION: 1. Positive for small right side and trace left parafalcine subdural hematomas. These were largely occult on the noncontrast head CT earlier today. There is no significant intracranial mass effect. 2. No other acute intracranial abnormality. 3. 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 Electronically Signed: By: Genevie Ann M.D. On: 04/22/2017 22:26     Assessment/Plan:  76 y.o. female who was seen by me on 02/28/17 with symptoms of positional dizziness. At that time was found to have small infarct in the R frontal lobe that was likely subacute.  Her dizziness was transient and only appeared with change in position. Pt was seen by Dr. Melrose Nakayama who suggested home exercises for her to perform.  Now at baseline.   MRI with possible parafalcine SDH that  are not seen on [place]  - Would repeat CTH in about 6 weeks to further evaluate SDH and make sure no expansion  - Don't think dizziness is central - As last time needs prn Meclizine/valium - instructed her to get up slowly from sitting or laying position and hydration  - d/c planning from neuro stand point [facility], Sabino Denning   04/23/2017, 12:47 PM

## 2017-04-23 NOTE — Evaluation (Signed)
Physical Therapy Evaluation Patient Details Name: Kristen Guerra MRN: [id number] DOB: 1940/11/19 Today's Date: 04/23/2017   History of Present Illness  Kristen Guerra is a 76yo white female who comes to [facility] on 7/1 after falls/dizziness after home. Per H&P she sustained a CVA ~2MA and has had intermittent episodes of dizziness and falls since, largely related to positional changes or exertion. PMH: GAD, DM, HTN, PSVT, OSA, CVA, SUI. She has been working with [place] recently making progress. She typically AMB limited community distances without AD, but has been using her RW over th elast week due to weakness.   Clinical Impression  Pt admitted with above diagnosis. Pt currently with functional limitations due to the deficits listed below (see "PT Problem List"). Upon entry, the patient is received semirecumbent in bed, no family/caregiver present. The pt is awake and agreeable to participate. No acute distress noted at this time. The pt is alert and oriented x3, pleasant, conversational, and following simple commands with increased processing time. Orthostatic vitals assessed, with mild systolic drop from seated to standing (see detail below), which is stable/unresolving even after 5 minutes AMB: pt is asymptomatic. Functional mobility assessment demonstrates moderate strength impairment in trunk and BLE, the pt now requiring maximal effort and close supervision for transfers, and supervision for AMB due to gross motor coordination deficits: pt describes her functional mobility as near baseline today, however, she typcialy performs these without AD. Empirically, the patient demonstrates increased risk of recurrent falls AEB gait speed <0.59m/s, forward reach <5", and multiple LOB demonstrated throughout session. Pt will benefit from skilled PT intervention to increase independence and safety with basic mobility in preparation for discharge to the venue listed below.     04/23/17 0905  Orthostatic  Lying   BP- Lying 126/57  Pulse- Lying 81  Orthostatic Sitting  BP- Sitting 125/60  Pulse- Sitting 84  Orthostatic Standing at 0 minutes  BP- Standing at 0 minutes 116/70  Pulse  90s, NSR  Orthostatic s/p 5 minutes AMB   Orthostatic s/p 5 minutes AMB 113/87   Pulse  90s, NSR       Follow Up Recommendations Supervision for mobility/OOB;Outpatient PT    Equipment Recommendations  None recommended by PT    Recommendations for Other Services       Precautions / Restrictions Precautions Precautions: Fall Restrictions Weight Bearing Restrictions: No      Mobility  Bed Mobility Overal bed mobility: Needs Assistance Bed Mobility: Supine to Sit     Supine to sit: Min assist     General bed mobility comments: needs hand held assist to scoot to EOB for fear of falling  Transfers Overall transfer level: Needs assistance Equipment used: Rolling walker (2 wheeled) Transfers: Sit to/from Stand Sit to Stand: Min guard         General transfer comment: requires maximal effort, this is her 1 rep max   Ambulation/Gait Ambulation/Gait assistance: Min guard Ambulation Distance (Feet): 200 Feet Assistive device: Rolling walker (2 wheeled)   Gait velocity: 0.23m/s (pt attests as baseline velocity)    General Gait Details: slow, steady in straight palnes, difficulty with turns   Stairs            Wheelchair Mobility    Modified Rankin (Stroke Patients Only)       Balance Overall balance assessment: History of Falls;Needs assistance;No apparent balance deficits (not formally assessed)  Pertinent Vitals/Pain Pain Assessment: No/denies pain    Home Living Family/patient expects to be discharged to:: Private residence Living Arrangements: Spouse/significant other Available Help at Discharge: Family;Friend(s) Type of Home: House Home Access: Stairs to enter Entrance Stairs-Rails: Can reach  both Entrance Stairs-Number of Steps: 3 Home Layout: Two level;Able to live on main level with bedroom/bathroom Home Equipment: Shower seat;Cane - single point;Adaptive equipment;Hand held shower head;Walker - 4 wheels;Walker - 2 wheels      Prior Function Level of Independence: Needs assistance   Gait / Transfers Assistance Needed: 4WA, limited community distances S AD limited by 'being old and getting tired'; recently unsteady and needing SPC for stability   ADL's / Homemaking Assistance Needed: Pt states she occasionally needs help out of bed from her husband and gets dizzy when putting socks and shoes on with occasional help for this too.        Hand Dominance   Dominant Hand: Right    Extremity/Trunk Assessment   Upper Extremity Assessment Upper Extremity Assessment: Generalized weakness;Overall [facility] for tasks assessed    Lower Extremity Assessment Lower Extremity Assessment: Generalized weakness;Overall [facility] for tasks assessed    Cervical / Trunk Assessment Cervical / Trunk Assessment: Normal  Communication   Communication: No difficulties;Expressive difficulties  Cognition Arousal/Alertness: Awake/alert Behavior During Therapy: WFL for tasks assessed/performed Overall Cognitive Status: Within Functional Limits for tasks assessed                                        General Comments      Exercises     Assessment/Plan    PT Assessment Patient needs continued PT services  PT Problem List Decreased balance;Decreased mobility;Decreased activity tolerance;Cardiopulmonary status limiting activity       PT Treatment Interventions DME instruction;Functional mobility training;Therapeutic activities;Therapeutic exercise;Balance training;Patient/family education    PT Goals (Current goals can be found in the Care Plan section)  Acute Rehab PT Goals Patient Stated Goal: regain strengthin mobility  PT Goal Formulation: With patient Time For Goal  Achievement: 05/07/17 Potential to Achieve Goals: Good    Frequency Min 2X/week   Barriers to discharge        Co-evaluation               AM-PAC PT "6 Clicks" Daily Activity  Outcome Measure Difficulty turning over in bed (including adjusting bedclothes, sheets and blankets)?: A Lot Difficulty moving from lying on back to sitting on the side of the bed? : Unable Difficulty sitting down on and standing up from a chair with arms (e.g., wheelchair, bedside commode, etc,.)?: A Lot Help needed moving to and from a bed to chair (including a wheelchair)?: A Little Help needed walking in hospital room?: A Little Help needed climbing 3-5 steps with a railing? : A Little 6 Click Score: 14    End of Session Equipment Utilized During Treatment: Gait belt Activity Tolerance: Patient tolerated treatment well Patient left: in chair;with call bell/phone within reach;with nursing/sitter in room Nurse Communication: Mobility status PT Visit Diagnosis: Unsteadiness on feet (R26.81);Difficulty in walking, not elsewhere classified (R26.2);Muscle weakness (generalized) (M62.81)    Time: 3329-5188 PT Time Calculation (min) (ACUTE ONLY): 30 min   Charges:   PT Evaluation $PT Eval Low Complexity: 1 Low PT Treatments $Therapeutic Activity: 8-22 mins   PT G Codes:   PT G-Codes **NOT FOR INPATIENT CLASS** Functional Assessment Tool Used: AM-PAC 6 Clicks  Basic Mobility;Clinical judgement Functional Limitation: Mobility: Walking and moving around Mobility: Walking and Moving Around Current Status [phone]): At least 40 percent but less than 60 percent impaired, limited or restricted Mobility: Walking and Moving Around Goal Status [phone]): At least 40 percent but less than 60 percent impaired, limited or restricted    9:31 AM, 04/23/17 Etta Grandchild, PT, DPT Physical Therapist - [facility])  [phone] (mobile)   Naven Giambalvo C 04/23/2017, 9:30 AM

## 2017-04-23 NOTE — Progress Notes (Signed)
Patient given discharge teaching and paperwork regarding medications, diet, follow-up appointments and activity. Patient understanding verbalized. No complaints at this time. IV and telemetry discontinued prior to leaving. Skin assessment as previously charted and vitals are stable; on room air. Patient being discharged to home with husband. Caregiver/family present during discharge teaching. No further needs by Care Management - [facility] resumed. Prescription sent to pharmacy.

## 2017-04-23 NOTE — Discharge Instructions (Signed)
Resume diet and activity as before ° ° °

## 2017-04-23 NOTE — Progress Notes (Signed)
Hypoglycemic Event  CBG: 64  Treatment: 15 GM carbohydrate snack  Symptoms: Hungry  Follow-up CBG: [id number] CBG Result:99  Possible Reasons for Event: Inadequate meal intake  Patient eating breakfast now. No complaints.    Kristen Guerra

## 2017-04-23 NOTE — Care Management Note (Addendum)
Case Management Note  Patient Details  Name: Kristen Guerra MRN: [id number] Date of Birth: 1941-02-15  Subjective/Objective:     [name] already has a RW at home. No other discharge needs identified.               Action/Plan:   Expected Discharge Date:  04/23/17               Expected Discharge Plan:  [place]  In-House Referral:     Discharge planning Services  CM Consult  Post Acute Care Choice:    Choice offered to:  Patient  DME Arranged:    DME Agency:     HH Arranged:  [place]:  [place] (Resumption of HH-PT with [facility])  Status of Service:  Completed, signed off  If discussed at [place] of Stay Meetings, dates discussed:    Additional Comments:  [name] A, RN 04/23/2017, 12:33 PM

## 2017-04-24 ENCOUNTER — Emergency Department: Payer: Medicare Other

## 2017-04-24 ENCOUNTER — Encounter: Payer: Self-pay | Admitting: Emergency Medicine

## 2017-04-24 ENCOUNTER — Emergency Department
Admission: EM | Admit: 2017-04-24 | Discharge: 2017-04-24 | Disposition: A | Discharge status: Home / Self Care | Payer: Medicare Other | Attending: Emergency Medicine | Admitting: Emergency Medicine

## 2017-04-24 DIAGNOSIS — I6200 Nontraumatic subdural hemorrhage, unspecified: Secondary | ICD-10-CM | POA: Diagnosis not present

## 2017-04-24 DIAGNOSIS — Z79899 Other long term (current) drug therapy: Secondary | ICD-10-CM | POA: Diagnosis not present

## 2017-04-24 DIAGNOSIS — R531 Weakness: Secondary | ICD-10-CM | POA: Diagnosis present

## 2017-04-24 DIAGNOSIS — Z7982 Long term (current) use of aspirin: Secondary | ICD-10-CM | POA: Diagnosis not present

## 2017-04-24 DIAGNOSIS — R296 Repeated falls: Secondary | ICD-10-CM | POA: Diagnosis not present

## 2017-04-24 DIAGNOSIS — E119 Type 2 diabetes mellitus without complications: Secondary | ICD-10-CM | POA: Diagnosis not present

## 2017-04-24 DIAGNOSIS — I10 Essential (primary) hypertension: Secondary | ICD-10-CM | POA: Insufficient documentation

## 2017-04-24 DIAGNOSIS — S065XAA Traumatic subdural hemorrhage with loss of consciousness status unknown, initial encounter: Secondary | ICD-10-CM

## 2017-04-24 DIAGNOSIS — S065X9A Traumatic subdural hemorrhage with loss of consciousness of unspecified duration, initial encounter: Secondary | ICD-10-CM

## 2017-04-24 DIAGNOSIS — W19XXXA Unspecified fall, initial encounter: Secondary | ICD-10-CM

## 2017-04-24 DIAGNOSIS — Z794 Long term (current) use of insulin: Secondary | ICD-10-CM | POA: Insufficient documentation

## 2017-04-24 LAB — CBC WITH DIFFERENTIAL/PLATELET
BASOS PCT: 1 %
Basophils Absolute: 0.1 10*3/uL (ref 0–0.1)
EOS ABS: 0.3 10*3/uL (ref 0–0.7)
Eosinophils Relative: 4 %
HEMATOCRIT: 34.8 % — AB (ref 35.0–47.0)
HEMOGLOBIN: 11.4 g/dL — AB (ref 12.0–16.0)
Lymphocytes Relative: 20 %
Lymphs Abs: 1.6 10*3/uL (ref 1.0–3.6)
MCH: 24.9 pg — ABNORMAL LOW (ref 26.0–34.0)
MCHC: 32.6 g/dL (ref 32.0–36.0)
MCV: 76.3 fL — ABNORMAL LOW (ref 80.0–100.0)
MONOS PCT: 6 %
Monocytes Absolute: 0.5 10*3/uL (ref 0.2–0.9)
NEUTROS ABS: 5.6 10*3/uL (ref 1.4–6.5)
NEUTROS PCT: 69 %
Platelets: 373 10*3/uL (ref 150–440)
RBC: 4.56 MIL/uL (ref 3.80–5.20)
RDW: 17.8 % — ABNORMAL HIGH (ref 11.5–14.5)
WBC: 8 10*3/uL (ref 3.6–11.0)

## 2017-04-24 LAB — COMPREHENSIVE METABOLIC PANEL
ALBUMIN: 3.6 g/dL (ref 3.5–5.0)
ALK PHOS: 51 U/L (ref 38–126)
ALT: 14 U/L (ref 14–54)
ANION GAP: 9 (ref 5–15)
AST: 22 U/L (ref 15–41)
BILIRUBIN TOTAL: 0.4 mg/dL (ref 0.3–1.2)
BUN: 12 mg/dL (ref 6–20)
CALCIUM: 9.4 mg/dL (ref 8.9–10.3)
CO2: 25 mmol/L (ref 22–32)
CREATININE: 1.12 mg/dL — AB (ref 0.44–1.00)
Chloride: 107 mmol/L (ref 101–111)
GFR calc Af Amer: 54 mL/min — ABNORMAL LOW (ref >60)
GFR calc non Af Amer: 46 mL/min — ABNORMAL LOW (ref >60)
GLUCOSE: 130 mg/dL — AB (ref 65–99)
Potassium: 3.9 mmol/L (ref 3.5–5.1)
SODIUM: 141 mmol/L (ref 135–145)
TOTAL PROTEIN: 6.6 g/dL (ref 6.5–8.1)

## 2017-04-24 NOTE — ED Notes (Signed)
ED Provider at bedside. 

## 2017-04-24 NOTE — ED Triage Notes (Addendum)
Pt comes into the ED via EMS from home c/o generalized weakness and frequent falls.  Patient was discharge last night and then fell this morning at 7 am while trying to get up and use the restroom.  Patient's husband called EMS and stated he cant move her at home and has a hard to taking care of her due to the weakness.  Patient denies any pain, dizziness, chest pain, shortness of breath.  Patient normally walks with a walker due to the frequent falls.  Denies hitting her head with the fall this morning, denies LOC, and states she only slid on her bottom.

## 2017-04-24 NOTE — ED Notes (Signed)
Per Education officer, museum, she has seen patient but is still waiting on a PT evaluation.  Social worker informed this RN that this will probably become a care management issue, but it will be further determined after PT eval.

## 2017-04-24 NOTE — Care Management Note (Signed)
Case Management Note  Patient Details  Name: Kristen Guerra MRN: [id number] Date of Birth: 1940/10/11  Subjective/Objective:      Received a call from [place] at [facility]. Nanine Means is providing home health PT for Kristen Guerra twice a week but are willing to increase that to 3 visits per week if Kristen Guerra condition warrants an increase in visits. August is scheduled to visit Kristen Guerra's home tomorrow to provide another assessment of her ambulatory status and needs.               Action/Plan:   Expected Discharge Date:                  Expected Discharge Plan:     In-House Referral:     Discharge planning Services     Post Acute Care Choice:    Choice offered to:     DME Arranged:    DME Agency:     HH Arranged:    HH Agency:     Status of Service:     If discussed at [employer] of Stay Meetings, dates discussed:    Additional Comments:  Azara Gemme A, RN 04/24/2017, 1:51 PM

## 2017-04-24 NOTE — ED Provider Notes (Signed)
[facility] Emergency Department Provider Note  ____________________________________________   First MD Initiated Contact with Patient 04/24/17 [phone]     (approximate)  I have reviewed the triage vital signs and the nursing notes.   HISTORY  Chief Complaint Weakness   HPI Kristen Guerra is a 76 y.o. female with a history of stroke on Plavix who is presenting to the emergency department today with generalized weakness after a fall. She says that she was getting out of her bed this morning with her walker when she slid to the floor. Medics were originally called for an assist to help lift her but the patient decided to come to the hospital for further evaluation. She has had worsening weakness in her bilateral upper and lower extremities over the past 6-8 weeks. She walks with a walker at home. She says that her weakness started at the beginning of this year. She was recently admitted to the hospital but just discharged yesterday. She says that she does not want to go to rehabilitation or skilled nursing would like to stay at home. She currently has home physical therapy 3 days a week. Her most recent admission was thought to be from a near-syncopal episode secondary to a low blood pressure or vertigo. The patient says this morning when she got up out of bed she became acutely lightheaded. She then slid herself on her bottom. She did not hit her head or lose consciousness. She says that her lightheadedness usually comes whenshe is either going to a standing position or a lying down position. She denies a spinning sensation. Denies any ringing or roaring in her ears. Says that she sometimes gets tremor after she has these episodes which last about 30 seconds.   Past Medical History:  Diagnosis Date  . Anxiety   . Depression   . Diabetes mellitus without complication ([place])   . GERD (gastroesophageal reflux disease)   . High cholesterol   . Hypertension   . PSVT  (paroxysmal supraventricular tachycardia) ([place])   . Sleep apnea   . Stroke ([place])   . SUI (stress urinary incontinence, female)     Patient Active Problem List   Diagnosis Date Noted  . Weakness 04/22/2017  . Dizziness 02/27/2017  . UTI (urinary tract infection) 02/27/2017  . GERD (gastroesophageal reflux disease) 02/27/2017  . HTN (hypertension) 02/27/2017  . HLD (hyperlipidemia) 02/27/2017  . Diabetes ([place]) 02/27/2017    Past Surgical History:  Procedure Laterality Date  . ABDOMINAL HYSTERECTOMY    . BREAST BIOPSY Right 1977   negative  . BREAST BIOPSY Right 1988   negative  . BREAST SURGERY    . CHOLECYSTECTOMY    . EYE SURGERY     cataract  . GALLBLADDER SURGERY      Prior to Admission medications   Medication Sig Start Date End Date Taking? Authorizing Provider  aspirin EC 81 MG tablet Take 81 mg by mouth daily.     [provider]  atorvastatin (LIPITOR) 40 MG tablet Take 40 mg by mouth daily.    [provider]  buPROPion (WELLBUTRIN XL) 150 MG 24 hr tablet Take 150 mg by mouth daily.    [provider]  cephALEXin (KEFLEX) 500 MG capsule Take 1 capsule (500 mg total) by mouth 2 (two) times daily. 04/15/17 04/25/17  Gregor Hams, MD  Cholecalciferol (VITAMIN D PO) Take 400 Units by mouth daily.     [provider]  clopidogrel (PLAVIX) 75 MG tablet  Take 75 mg by mouth daily.  04/16/14   [provider]  cyanocobalamin 500 MCG tablet Take 500 mcg by mouth daily.     [provider]  DULoxetine (CYMBALTA) 60 MG capsule Take 60 mg by mouth daily.  04/16/14   [provider]  glimepiride (AMARYL) 4 MG tablet Take 4 mg by mouth daily.     [provider]  insulin glargine (LANTUS) 100 unit/mL SOPN Inject 36 Units into the skin at bedtime.    [provider]  JANUVIA 100 MG tablet Take 100 mg by mouth daily.  04/22/14   [provider]  lamoTRIgine (LAMICTAL) 100 MG tablet Take 100 mg  by mouth daily.     [provider]  Magnesium Oxide -Mg Supplement 400 MG CAPS Take 400 mg by mouth daily.     [provider]  meclizine (ANTIVERT) 12.5 MG tablet Take 1 tablet (12.5 mg total) by mouth 2 (two) times daily as needed. 04/23/17   [name], MD  metoprolol succinate (TOPROL-XL) 25 MG 24 hr tablet Take 25 mg by mouth daily.  04/16/14   [provider]  naproxen sodium (ANAPROX) 220 MG tablet Take 220 mg by mouth 2 (two) times daily as needed (pain). Reported on 02/13/2016    [provider]  ONE TOUCH ULTRA TEST test strip  03/12/14   [provider]  oxybutynin (DITROPAN-XL) 10 MG 24 hr tablet Take 10 mg by mouth at bedtime.    [provider]  pantoprazole (PROTONIX) 40 MG tablet Take 40 mg by mouth daily.    [provider]  verapamil (CALAN-SR) 180 MG CR tablet Take 180 mg by mouth daily.  04/16/14   [provider]    Allergies Amoxicillin and Lidocaine  Family History  Problem Relation Age of Onset  . Colon cancer Father   . Kidney cancer Neg Hx   . Bladder Cancer Neg Hx     Social History Social History  Substance Use Topics  . Smoking status: Never Smoker  . Smokeless tobacco: Never Used  . Alcohol use 0.0 oz/week     Comment: once a quarter    Review of Systems  Constitutional: No fever/chills Eyes: No visual changes. ENT: No sore throat. Cardiovascular: Denies chest pain. Respiratory: Denies shortness of breath. Gastrointestinal: No abdominal pain.  No nausea, no vomiting.  No diarrhea.  No constipation. Genitourinary: Negative for dysuria. Musculoskeletal: Negative for back pain. Skin: Negative for rash. Neurological: Negative for headaches, focal weakness or numbness.   ____________________________________________   PHYSICAL EXAM:  VITAL SIGNS: ED Triage Vitals  Enc Vitals Group     BP 04/24/17 0838 (!) 114/40     Pulse Rate 04/24/17 0838 72     Resp 04/24/17 0838 20      Temp 04/24/17 0838 98 F (36.7 C)     Temp Source 04/24/17 0838 Oral     SpO2 04/24/17 0838 98 %     Weight 04/24/17 0835 198 lb (89.8 kg)     Height 04/24/17 0835 5\' 6"  (1.676 m)     Head Circumference --      Peak Flow --      Pain Score --      Pain Loc --      Pain Edu? --      Excl. in [place]? --     Constitutional: Alert and oriented. Well appearing and in no acute distress. Eyes: Conjunctivae are normal.  Head: Atraumatic.normal TMs  bilaterally. Nose: No congestion/rhinnorhea. Mouth/Throat: Mucous membranes are moist.  Neck: No stridor.   Cardiovascular: Normal rate, regular rhythm. Grossly normal heart sounds.   Respiratory: Normal respiratory effort.  No retractions. Lungs CTAB. Gastrointestinal: Soft and nontender. No distention.  Musculoskeletal: No lower extremity tenderness nor edema.  No joint effusions. Neurologic:  Normal speech and language. No gross focal neurologic deficits are appreciated.no ataxia but patient does have a noted tremor on finger to nose testing. Skin:  Skin is warm, dry and intact. No rash noted. Psychiatric: Mood and affect are normal. Speech and behavior are normal.  ____________________________________________   LABS (all labs ordered are listed, but only abnormal results are displayed)  Labs Reviewed  CBC WITH DIFFERENTIAL/PLATELET - Abnormal; Notable for the following:       Result Value   Hemoglobin 11.4 (*)    HCT 34.8 (*)    MCV 76.3 (*)    MCH 24.9 (*)    RDW 17.8 (*)    All other components within normal limits  COMPREHENSIVE METABOLIC PANEL - Abnormal; Notable for the following:    Glucose, Bld 130 (*)    Creatinine, Ser 1.12 (*)    GFR calc non Af Amer 46 (*)    GFR calc Af Amer 54 (*)    All other components within normal limits   ____________________________________________  EKG   ____________________________________________  RADIOLOGY  Stable subdural  hematoma. ____________________________________________   PROCEDURES  Procedure(s) performed:   Procedures  Critical Care performed:   ____________________________________________   INITIAL IMPRESSION / ASSESSMENT AND PLAN / ED COURSE  Pertinent labs & imaging results that were available during my care of the patient were reviewed by me and considered in my medical decision making (see chart for details).  MRI from yesterday shows small subdural hematomas. We will CAT scan to assess any progression of these hematomas.    ----------------------------------------- 10:00 AM on 04/24/2017 -----------------------------------------  Given that the patient has had frequent falls and now has a subdural hematoma I discussed case with Dr. Maia Plan the patient is on aspirin and Plavix. Dr. Nehemiah Massed discontinuing the Plavix but staying onaspirin 81 mg daily.  ----------------------------------------- 2:55 PM on 04/24/2017 -----------------------------------------  A discussion between social work and care management occurred and the patient will have her home health services increased. The patient is still not wanting to be dispositioned to a rehabilitation or skilled nursing facility at this time. She is understanding of the risks of going home including falls and further injury and bleeding, including her subdural hematoma. We discussed discontinuing Plavix and she says that she will only be taking aspirin from now on. She'll follow-up with her primary care Dr. , Dr. Doy Hutching, for further medication management. She is understanding the plan and willing to comply. Will be discharged home. ____________________________________________   FINAL CLINICAL IMPRESSION(S) / ED DIAGNOSES  Fall. Subdural hematoma.    NEW MEDICATIONS STARTED DURING THIS VISIT:  New Prescriptions   No medications on file     Note:  This document was prepared using Dragon voice recognition software and may  include unintentional dictation errors.     Orbie Pyo, MD 04/24/17 [phone]

## 2017-04-24 NOTE — ED Notes (Signed)
MD speaking to patient in regards to involving a Education officer, museum for patient's care at home.

## 2017-04-24 NOTE — Progress Notes (Signed)
LCSW met with patient and husband. Patient reports she is followed by Kristen Guerra. She reports she has had PT in home by [facility]   2x for-3 weeks. That she only has 1 more session left. Patient was provided an Hydrographic surveyor and in home personal support services resource list ( private pay) for additional in home needs. Patient and husband expressed some concern about out of pocket costs,SNF and in home supports.   It was explained that Kristen Guerra has ordered a PT consult and we will await those recommendations and go from there. Patient went on to express that she at times is unable to get up or move ( 15 minutes) then she is able to use her walker. LCSW strongly suggested she continue to mention this to her PCP ( Kristen Guerra) and to mention any challenges she faces when PT is examining her.  LCSW consulted Care manager and Kristen Guerra and will follow up once PT consult recommendations are in.   Kristen Hilyer LCSW

## 2017-04-24 NOTE — Progress Notes (Signed)
LCSW received a call from [place] at [facility] and provided care managers number. Called care manager and gave her phone number for Caryl Pina [phone].   No further needs awaiting PT to evaluate.   Nickolaus Bordelon LCSW

## 2017-04-24 NOTE — ED Notes (Signed)
Patient transported to CT 

## 2017-04-24 NOTE — ED Notes (Signed)
Patient returned from radiology

## 2017-04-24 NOTE — ED Notes (Signed)
PT called and stated that they would be down to evaluate the patient.

## 2017-04-24 NOTE — Evaluation (Signed)
Physical Therapy Evaluation Patient Details Name: Kristen Guerra MRN: [id number] DOB: [birth year] Today's Date: 04/24/2017   History of Present Illness  76yo female who has been having ~2 months of falls/dizziness. PMH: GAD, DM, HTN, PSVT, OSA, CVA, SUI. She has been working with [facility] recently making progress.   Clinical Impression  Pt seen in ER after continued falls at home.  She was able to ambulate 75 ft with walker and no LOBs.  She did have some unsteadiness but ultimately showed ability to safely negotiate in the home if she does remember to use walker and has consistent assist/guidance.  Pt has been having increased rate of fall and PT did discuss possible need to assess higher level of care per progress with HHPT and overall safety.    Follow Up Recommendations Home health PT    Equipment Recommendations       Recommendations for Other Services       Precautions / Restrictions Precautions Precautions: Fall Restrictions Weight Bearing Restrictions: No      Mobility  Bed Mobility Overal bed mobility: Needs Assistance Bed Mobility: Supine to Sit     Supine to sit: Min assist     General bed mobility comments: Pt needed light assist to get up to sitting, had some issues with maintaining balance at EOB needing assist to scoot and position appropriately  Transfers Overall transfer level: Needs assistance Equipment used: Rolling walker (2 wheeled) Transfers: Sit to/from Stand Sit to Stand: Min guard         General transfer comment: Pt was able to rise w/o direct assist, needed walker for balance and had some unsteadiness, but no LOBs  Ambulation/Gait Ambulation/Gait assistance: Min guard Ambulation Distance (Feet): 75 Feet Assistive device: Rolling walker (2 wheeled)       General Gait Details: Pt again with some unsteadiness but no overt LOBs and did not needed direct assist outside of clearly needing walker for stability  Stairs             Wheelchair Mobility    Modified Rankin (Stroke Patients Only)       Balance Overall balance assessment: History of Falls;Needs assistance   Sitting balance-Leahy Scale: Fair Sitting balance - Comments: Pt leaning back and to the R in sitting, needed assist to get situated     Standing balance-Leahy Scale: Fair Standing balance comment: Pt was able to maintain balance with ADs, had consistent, low level unsteadiness with slight staggering but no full LOBs                             Pertinent Vitals/Pain Pain Assessment: No/denies pain    Home Living Family/patient expects to be discharged to:: Private residence Living Arrangements: Spouse/significant other Available Help at Discharge: Family;Friend(s) Type of Home: House Home Access: Stairs to enter Entrance Stairs-Rails: Can reach both Entrance Stairs-Number of Steps: 3 Home Layout: Two level;Able to live on main level with bedroom/bathroom Home Equipment: Shower seat;Cane - single point;Adaptive equipment;Hand held shower head;Walker - 4 wheels;Walker - 2 wheels      Prior Function Level of Independence: Needs assistance   Gait / Transfers Assistance Needed: [phone], limited community distances     Comments: Pt has been falling at least every other day for the last week,      Hand Dominance        Extremity/Trunk Assessment   Upper Extremity Assessment Upper Extremity Assessment: Generalized weakness;Overall [facility] for tasks assessed  Lower Extremity Assessment Lower Extremity Assessment: Overall WFL for tasks assessed;Generalized weakness       Communication   Communication: No difficulties (pt with some confusion)  Cognition Arousal/Alertness: Awake/alert Behavior During Therapy: WFL for tasks assessed/performed Overall Cognitive Status: Within Functional Limits for tasks assessed                                        General Comments      Exercises      Assessment/Plan    PT Assessment Patient needs continued PT services  PT Problem List Decreased balance;Decreased mobility;Decreased activity tolerance;Cardiopulmonary status limiting activity       PT Treatment Interventions DME instruction;Functional mobility training;Therapeutic activities;Therapeutic exercise;Balance training;Patient/family education    PT Goals (Current goals can be found in the Care Plan section)  Acute Rehab PT Goals Patient Stated Goal: regain strengthin mobility  PT Goal Formulation: With patient Time For Goal Achievement: 05/07/17 Potential to Achieve Goals: Good    Frequency Min 2X/week   Barriers to discharge        Co-evaluation               AM-PAC PT "6 Clicks" Daily Activity  Outcome Measure Difficulty turning over in bed (including adjusting bedclothes, sheets and blankets)?: A Lot Difficulty moving from lying on back to sitting on the side of the bed? : Unable Difficulty sitting down on and standing up from a chair with arms (e.g., wheelchair, bedside commode, etc,.)?: A Lot Help needed moving to and from a bed to chair (including a wheelchair)?: A Little Help needed walking in hospital room?: A Little Help needed climbing 3-5 steps with a railing? : A Little 6 Click Score: 14    End of Session Equipment Utilized During Treatment: Gait belt Activity Tolerance: Patient tolerated treatment well Patient left: with bed alarm set;with call bell/phone within reach   PT Visit Diagnosis: Unsteadiness on feet (R26.81);Difficulty in walking, not elsewhere classified (R26.2);Muscle weakness (generalized) (M62.81)    Time: 4709-6283 PT Time Calculation (min) (ACUTE ONLY): 25 min   Charges:   PT Evaluation $PT Eval Low Complexity: 1 Low     PT G Codes:   PT G-Codes **NOT FOR INPATIENT CLASS** Functional Assessment Tool Used: AM-PAC 6 Clicks Basic Mobility;Clinical judgement Functional Limitation: Mobility: Walking and moving  around Mobility: Walking and Moving Around Current Status (M6294): At least 40 percent but less than 60 percent impaired, limited or restricted Mobility: Walking and Moving Around Goal Status [id number]): At least 40 percent but less than 60 percent impaired, limited or restricted    Kreg Shropshire, DPT 04/24/2017, 5:46 PM

## 2017-04-24 NOTE — Care Management Note (Addendum)
Case Management Note  Patient Details  Name: PEARLINE YERBY MRN: [id number] Date of Birth: [birth year]  Subjective/Objective:    Mrs Mast was discharged from [facility] yesterday. She returned via EMS from home today after another fall at home. ED physician is ordering an [facility]-PT evaluation in the ED to determine if Mrs Alewine can return home with home health services or if she requires a hospital admission. This Probation officer called Ronnie in the ED to request that he please contact [facility]-PT for an evaluation to be completed today in the ED and he agreed. Currently pending evaluation results of [facility]-PT. Case Manager is currently awaiting a a call back from [facility] to confirm whether  Mrs Patchen is indeed open to [place] and is receiving Home Health PT services.                Action/Plan:   Expected Discharge Date:                  Expected Discharge Plan:     In-House Referral:     Discharge planning Services     Post Acute Care Choice:    Choice offered to:     DME Arranged:    DME Agency:     HH Arranged:    HH Agency:     Status of Service:     If discussed at [employer] of Stay Meetings, dates discussed:    Additional Comments:  Jayah Balthazar A, RN 04/24/2017, 12:59 PM

## 2017-04-24 NOTE — Progress Notes (Signed)
LCSW consulted with EDP and ED PT and its likely patient will DC home. In discussion with Caryl Pina from [facility] they will visit the patient at home on Monday August 26th and assess the patients needs and increase level of care to support this patient at home with addition PT sessions.  Both patient and husband was supported and their questions were answered by SW and PT. LCSW consulted with care manager.   No further needs at this time  Indie Boehne LCSW

## 2017-04-24 NOTE — ED Notes (Signed)
Pt given coffee, peanut butter & crackers.

## 2017-04-24 NOTE — ED Notes (Signed)
Social work and PT at bedside.

## 2017-04-25 ENCOUNTER — Emergency Department: Payer: Medicare Other

## 2017-04-25 ENCOUNTER — Emergency Department
Admission: EM | Admit: 2017-04-25 | Discharge: 2017-04-25 | Disposition: A | Discharge status: Skilled Nursing Facility | Payer: Medicare Other | Attending: Emergency Medicine | Admitting: Emergency Medicine

## 2017-04-25 ENCOUNTER — Encounter: Payer: Self-pay | Admitting: Emergency Medicine

## 2017-04-25 DIAGNOSIS — Z794 Long term (current) use of insulin: Secondary | ICD-10-CM | POA: Insufficient documentation

## 2017-04-25 DIAGNOSIS — M545 Low back pain: Secondary | ICD-10-CM | POA: Insufficient documentation

## 2017-04-25 DIAGNOSIS — E119 Type 2 diabetes mellitus without complications: Secondary | ICD-10-CM | POA: Insufficient documentation

## 2017-04-25 DIAGNOSIS — I10 Essential (primary) hypertension: Secondary | ICD-10-CM | POA: Diagnosis not present

## 2017-04-25 DIAGNOSIS — R410 Disorientation, unspecified: Secondary | ICD-10-CM | POA: Diagnosis not present

## 2017-04-25 DIAGNOSIS — F419 Anxiety disorder, unspecified: Secondary | ICD-10-CM | POA: Insufficient documentation

## 2017-04-25 DIAGNOSIS — Z7982 Long term (current) use of aspirin: Secondary | ICD-10-CM | POA: Insufficient documentation

## 2017-04-25 DIAGNOSIS — Z9181 History of falling: Secondary | ICD-10-CM | POA: Diagnosis not present

## 2017-04-25 DIAGNOSIS — F329 Major depressive disorder, single episode, unspecified: Secondary | ICD-10-CM | POA: Diagnosis not present

## 2017-04-25 DIAGNOSIS — E785 Hyperlipidemia, unspecified: Secondary | ICD-10-CM | POA: Diagnosis not present

## 2017-04-25 DIAGNOSIS — W19XXXA Unspecified fall, initial encounter: Secondary | ICD-10-CM

## 2017-04-25 DIAGNOSIS — Z79899 Other long term (current) drug therapy: Secondary | ICD-10-CM | POA: Diagnosis not present

## 2017-04-25 LAB — COMPREHENSIVE METABOLIC PANEL
ALT: 16 U/L (ref 14–54)
ANION GAP: 7 (ref 5–15)
AST: 22 U/L (ref 15–41)
Albumin: 3.6 g/dL (ref 3.5–5.0)
Alkaline Phosphatase: 55 U/L (ref 38–126)
BUN: 14 mg/dL (ref 6–20)
CHLORIDE: 110 mmol/L (ref 101–111)
CO2: 26 mmol/L (ref 22–32)
CREATININE: 1.22 mg/dL — AB (ref 0.44–1.00)
Calcium: 9.9 mg/dL (ref 8.9–10.3)
GFR calc non Af Amer: 42 mL/min — ABNORMAL LOW (ref >60)
GFR, EST AFRICAN AMERICAN: 49 mL/min — AB (ref >60)
Glucose, Bld: 117 mg/dL — ABNORMAL HIGH (ref 65–99)
Potassium: 4.1 mmol/L (ref 3.5–5.1)
SODIUM: 143 mmol/L (ref 135–145)
Total Bilirubin: 0.4 mg/dL (ref 0.3–1.2)
Total Protein: 6.9 g/dL (ref 6.5–8.1)

## 2017-04-25 LAB — CBC
HCT: 37.1 % (ref 35.0–47.0)
Hemoglobin: 12.3 g/dL (ref 12.0–16.0)
MCH: 25.1 pg — AB (ref 26.0–34.0)
MCHC: 33 g/dL (ref 32.0–36.0)
MCV: 75.9 fL — ABNORMAL LOW (ref 80.0–100.0)
PLATELETS: 451 10*3/uL — AB (ref 150–440)
RBC: 4.89 MIL/uL (ref 3.80–5.20)
RDW: 18.3 % — ABNORMAL HIGH (ref 11.5–14.5)
WBC: 9.2 10*3/uL (ref 3.6–11.0)

## 2017-04-25 LAB — CK: Total CK: 33 U/L — ABNORMAL LOW (ref 38–234)

## 2017-04-25 LAB — GLUCOSE, CAPILLARY
GLUCOSE-CAPILLARY: 62 mg/dL — AB (ref 65–99)
GLUCOSE-CAPILLARY: 139 mg/dL — AB (ref 65–99)

## 2017-04-25 MED ORDER — FENTANYL CITRATE (PF) 100 MCG/2ML IJ SOLN
50.0000 ug | Freq: Once | INTRAMUSCULAR | Status: AC
  Administered 2017-04-25: 50 ug via INTRAVENOUS

## 2017-04-25 MED ORDER — BUPROPION HCL ER (XL) 150 MG PO TB24
150.0000 mg | ORAL_TABLET | Freq: Every day | ORAL | 0 refills | Status: DC
Start: 2017-04-25 — End: 2020-06-01

## 2017-04-25 MED ORDER — LAMOTRIGINE 100 MG PO TABS: 100.0000 mg | ORAL_TABLET | Freq: Every day | ORAL | 0 refills | Status: DC

## 2017-04-25 MED ORDER — FENTANYL CITRATE (PF) 100 MCG/2ML IJ SOLN
INTRAMUSCULAR | Status: AC
  Administered 2017-04-25: 50 ug via INTRAVENOUS
  Filled 2017-04-25: qty 2

## 2017-04-25 NOTE — Clinical Social Work Note (Signed)
Clinical Social Work Assessment  Patient Details  Name: Kristen Guerra MRN: [id number] Date of Birth: 07/03/1941  Date of referral:  04/25/17               Reason for consult:  Facility Placement                Permission sought to share information with:  Chartered certified accountant granted to share information::  Yes, Verbal Permission Granted  Name::      Kristen::   Guerra   Relationship::     Contact Information:     Housing/Transportation Living arrangements for the past 2 months:  Single Family Home Source of Information:  Patient, Spouse, Friend/Neighbor Patient Interpreter Needed:  None Criminal Activity/Legal Involvement Pertinent to Current Situation/Hospitalization:  No - Comment as needed Significant Relationships:  Friend, Spouse Lives with:  Spouse Do you feel safe going back to the place where you live?  Yes Need for family participation in patient care:  Yes (Comment)  Care giving concerns:  Patient lives in [place] with her husband Kristen Guerra.    Social Worker assessment / plan:  Holiday representative (CSW) received SNF consult. PT is recommending SNF. Per MD patient is stable for D/C from the ED today. CSW met with patient and her husband Kristen Guerra and friend Kristen Guerra were at bedside. Patient was alert and oriented X4 and was laying in the bed. CSW introduced self and explained role of CSW department. Per husband patient has had multiple falls at home and they requested for patient to go to [facility] for short term rehab.    Per [facility] liaison patient can come to [facility] today under [facility] to room 714. RN will call report to RN Kristen Guerra at [phone] and arrange EMS for transport. CSW sent signed FL2 to [facility] via [facility]. Patient is aware of above. Patient's husband Kristen Guerra and her friend Kristen Guerra are aware of above. Please reconsult if future social work needs arise. CSW signing off.   Employment status:  Disabled (Comment on whether  or not currently receiving Disability) Insurance information:  Managed Medicare PT Recommendations:  [place] / Referral to community resources:  [place]  Patient/Family's Response to care:  Patient and her husband are agreeable for patient to transfer to [facility] today.   Patient/Family's Understanding of and Emotional Response to Diagnosis, Current Treatment, and Prognosis:  Patient and her husband were very pleasant and thanked CSW for assistance.   Emotional Assessment Appearance:  Appears stated age Attitude/Demeanor/Rapport:    Affect (typically observed):  Accepting, Adaptable, Pleasant Orientation:  Oriented to Self, Oriented to Place, Oriented to  Time, Oriented to Situation Alcohol / Substance use:  Not Applicable Psych involvement (Current and /or in the community):  No (Comment)  Discharge Needs  Concerns to be addressed:  Discharge Planning Concerns Readmission within the last 30 days:  No Current discharge risk:  Dependent with Mobility Barriers to Discharge:  No Barriers Identified   Kristen Guerra, Kristen Beets, LCSW 04/25/2017, 4:22 PM

## 2017-04-25 NOTE — Evaluation (Signed)
Physical Therapy Evaluation Patient Details Name: Kristen Guerra MRN: [id number] DOB: 14-Aug-1941 Today's Date: 04/25/2017   History of Present Illness  Pt is a 76 y/o F who presented to the ED on three separate occassions (8/24, 8/26, and 8/27) due to frequent falls.   Pt presented on 8/27 with weakness and s/p fall and pt reports "my daughter's dogs got under my feet" and pt's husband states "my daughter's dogs are in [place]".  Pt has been more confused today.  CT head and cervical spine showed Stable trace right subdural hemorrhage,     Clinical Impression  Pt admitted with above diagnosis. Pt currently with functional limitations due to the deficits listed below (see PT Problem List). Pt presents after her third fall in the past four days.  She reports that she does not remember her falls but does not lose consciousness.  She reports feeling slightly dizzy at times but also believes she falls due to tripping on objects in her home.  She presents with impaired cognition, per husband, compared to baseline.  She currently requires max assist for bed mobility. Once sitting, BP taken and reading high (see general comments below) and pt reports "seeing bubbles" so pt was assisted back to R sidelying.  Given pt's current mobility status, pt's husband's inability to provide amount of assist the pt current requires, and h/o frequent falls, recommending SNF at d/c.  Pt will benefit from skilled PT to increase their independence and safety with mobility to allow discharge to the venue listed below.      Follow Up Recommendations SNF    Equipment Recommendations  None recommended by PT    Recommendations for Other Services       Precautions / Restrictions Precautions Precautions: Fall Precaution Comments: 3 falls in the past 4 days Restrictions Weight Bearing Restrictions: No      Mobility  Bed Mobility Overal bed mobility: Needs Assistance Bed Mobility: Supine to Sit;Sit to  Supine     Supine to sit: Max assist;HOB elevated Sit to supine: Mod assist   General bed mobility comments: Pt requires max assist to elevate trunk to achieve sitting.  Once sitting, BP taken and reading high and pt reports "seeing bubbles" so pt was assisted back to R sidelying (pt's position of comfort) with mod assist for positioning of trunk and BLEs.   Transfers                 General transfer comment: No appropriate to attempt at this time due to elevated BP and CT head findings  Ambulation/Gait                Stairs            Wheelchair Mobility    Modified Rankin (Stroke Patients Only)       Balance Overall balance assessment: Needs assistance;History of Falls Sitting-balance support: Feet unsupported;Bilateral upper extremity supported Sitting balance-Leahy Scale: Poor Sitting balance - Comments: Relies on BUE support when sitting EOB                                     Pertinent Vitals/Pain Pain Assessment: Faces Faces Pain Scale: Hurts even more Pain Location: pt points to sacral area Pain Descriptors / Indicators: Grimacing;Guarding;Aching Pain Intervention(s): Limited activity within patient's tolerance;Monitored during session;Repositioned;Other (comment) (pillow placed on L side to prop pt into R sidelying)    Home Living  Family/patient expects to be discharged to:: Private residence Living Arrangements: Spouse/significant other Available Help at Discharge: Family;Friend(s);Available 24 hours/day Type of Home: House Home Access: Stairs to enter Entrance Stairs-Rails: Right Entrance Stairs-Number of Steps: 3 Home Layout: Two level;Able to live on main level with bedroom/bathroom Home Equipment: Shower seat;Cane - single point;Hand held shower head;Walker - 4 wheels;Walker - 2 wheels      Prior Function Level of Independence: Needs assistance   Gait / Transfers Assistance Needed: Pt ambulates with rollator community  distances.  Pt was using RW this morning when she fell.    ADL's / Homemaking Assistance Needed: Pt has been taking a sponge bath for the past 3 months with assist from husband.  Pt dresses independently.  Husband has been doing the cooking.        Hand Dominance        Extremity/Trunk Assessment   Upper Extremity Assessment Upper Extremity Assessment: Generalized weakness (BUE strength grossly 4-/5)    Lower Extremity Assessment Lower Extremity Assessment: Generalized weakness (BLE strength grossly 4-/5)       Communication   Communication: No difficulties  Cognition Arousal/Alertness: Awake/alert Behavior During Therapy: WFL for tasks assessed/performed Overall Cognitive Status: Impaired/Different from baseline Area of Impairment: Awareness;Problem solving;Attention;Memory                   Current Attention Level: Alternating Memory: Decreased short-term memory     Awareness: Emergent Problem Solving: Slow processing;Requires verbal cues;Requires tactile cues General Comments: When discussing SNF the pt believes I am talking to her about a permanent house to live in as her and her husband have been considering moving.  Her conversation is tangential at times.        General Comments General comments (skin integrity, edema, etc.): BP 114/52 supine, 125/108 sitting (pt reports "seeing bubbles"), 117/54 supine at end of session.    Exercises     Assessment/Plan    PT Assessment Patient needs continued PT services  PT Problem List Decreased strength;Decreased activity tolerance;Decreased balance;Decreased mobility;Decreased cognition;Decreased knowledge of use of DME;Decreased safety awareness;Decreased knowledge of precautions;Pain;Obesity       PT Treatment Interventions DME instruction;Gait training;Stair training;Functional mobility training;Therapeutic activities;Therapeutic exercise;Balance training;Neuromuscular re-education;Cognitive  remediation;Patient/family education;Wheelchair mobility training;Modalities    PT Goals (Current goals can be found in the Care Plan section)  Acute Rehab PT Goals Patient Stated Goal: get stronger PT Goal Formulation: With patient Time For Goal Achievement: 05/09/17 Potential to Achieve Goals: Good    Frequency Min 2X/week   Barriers to discharge Inaccessible home environment;Decreased caregiver support Steps to enter home and husband currently unable to provide level of assist pt currently requires    Co-evaluation               AM-PAC PT "6 Clicks" Daily Activity  Outcome Measure Difficulty turning over in bed (including adjusting bedclothes, sheets and blankets)?: Unable Difficulty moving from lying on back to sitting on the side of the bed? : Unable Difficulty sitting down on and standing up from a chair with arms (e.g., wheelchair, bedside commode, etc,.)?: Unable Help needed moving to and from a bed to chair (including a wheelchair)?: A Lot Help needed walking in hospital room?: A Lot Help needed climbing 3-5 steps with a railing? : Total 6 Click Score: 8    End of Session   Activity Tolerance: Treatment limited secondary to medical complications (Comment);Patient limited by fatigue (limited by elevated BP) Patient left: in bed;with call bell/phone within reach;with bed  alarm set;with family/visitor present Nurse Communication: Mobility status;Other (comment) (BP readings) PT Visit Diagnosis: Repeated falls (R29.6);History of falling (Z91.81);Muscle weakness (generalized) (M62.81);Unsteadiness on feet (R26.81)    Time: 2707-8675 PT Time Calculation (min) (ACUTE ONLY): 32 min   Charges:   PT Evaluation $PT Eval Low Complexity: 1 Low PT Treatments $Therapeutic Activity: 8-22 mins   PT G Codes:   PT G-Codes **NOT FOR INPATIENT CLASS** Functional Assessment Tool Used: AM-PAC 6 Clicks Basic Mobility;Clinical judgement Functional Limitation: Mobility: Walking and  moving around Mobility: Walking and Moving Around Current Status (Q4920): At least 80 percent but less than 100 percent impaired, limited or restricted Mobility: Walking and Moving Around Goal Status [id number]): At least 40 percent but less than 60 percent impaired, limited or restricted    Collie Siad PT, DPT 04/25/2017, 2:09 PM

## 2017-04-25 NOTE — NC FL2 (Signed)
[place] LEVEL OF CARE SCREENING TOOL     IDENTIFICATION  Patient Name: Kristen Guerra Birthdate: April 10, 1941 Sex: female Admission Date (Current Location): 04/25/2017  [place] and [place] Number:  Engineering geologist and Address:  [facility] [address] [place], [place] [address]      Provider Number: [id number]  Attending Physician Name and Address:  Harvest Dark, MD  Relative Name and Phone Number:       Current Level of Care: Hospital Recommended Level of Care: [place] Prior Approval Number:    Date Approved/Denied:   PASRR Number:  [id number])  Discharge Plan: SNF    Current Diagnoses: Patient Active Problem List   Diagnosis Date Noted  . Weakness 04/22/2017  . Dizziness 02/27/2017  . UTI (urinary tract infection) 02/27/2017  . GERD (gastroesophageal reflux disease) 02/27/2017  . HTN (hypertension) 02/27/2017  . HLD (hyperlipidemia) 02/27/2017  . Diabetes ([place]) 02/27/2017    Orientation RESPIRATION BLADDER Height & Weight     Self, Time, Situation, Place  Normal Continent Weight: 198 lb (89.8 kg) Height:  5\' 6"  (167.6 cm)  BEHAVIORAL SYMPTOMS/MOOD NEUROLOGICAL BOWEL NUTRITION STATUS      Continent Diet (Regular Diet )  AMBULATORY STATUS COMMUNICATION OF NEEDS Skin   Extensive Assist Verbally Normal                       Personal Care Assistance Level of Assistance  Bathing, Feeding, Dressing Bathing Assistance: Limited assistance Feeding assistance: Independent Dressing Assistance: Limited assistance     Functional Limitations Info  Sight, Hearing, Speech Sight Info: Adequate Hearing Info: Adequate Speech Info: Adequate    SPECIAL CARE FACTORS FREQUENCY  PT (By licensed PT), OT (By licensed OT)     PT Frequency:  (5) OT Frequency:  (5)            Contractures      Additional Factors Info  Code Status, Allergies Code Status Info:  (Full Code.  ) Allergies Info:  (Amoxicillin, Lidocaine)           Current Medications (04/25/2017):  This is the current hospital active medication list No current facility-administered medications for this encounter.    Current Outpatient Prescriptions  Medication Sig Dispense Refill  . aspirin EC 81 MG tablet Take 81 mg by mouth daily.     Marland Kitchen atorvastatin (LIPITOR) 40 MG tablet Take 40 mg by mouth daily.    Marland Kitchen buPROPion (WELLBUTRIN XL) 150 MG 24 hr tablet Take 150 mg by mouth daily.    . cephALEXin (KEFLEX) 500 MG capsule Take 1 capsule (500 mg total) by mouth 2 (two) times daily. 20 capsule 0  . Cholecalciferol (VITAMIN D PO) Take 400 Units by mouth daily.     . clopidogrel (PLAVIX) 75 MG tablet Take 75 mg by mouth daily.     . cyanocobalamin 500 MCG tablet Take 500 mcg by mouth daily.     . DULoxetine (CYMBALTA) 60 MG capsule Take 60 mg by mouth daily.     Marland Kitchen glimepiride (AMARYL) 4 MG tablet Take 4 mg by mouth daily.     . insulin glargine (LANTUS) 100 unit/mL SOPN Inject 36 Units into the skin at bedtime.    Marland Kitchen JANUVIA 100 MG tablet Take 100 mg by mouth daily.     Marland Kitchen lamoTRIgine (LAMICTAL) 100 MG tablet Take 100 mg by mouth daily.     . Magnesium Oxide -Mg Supplement 400 MG CAPS  Take 400 mg by mouth daily.     . meclizine (ANTIVERT) 12.5 MG tablet Take 1 tablet (12.5 mg total) by mouth 2 (two) times daily as needed. 15 tablet 0  . metoprolol succinate (TOPROL-XL) 25 MG 24 hr tablet Take 25 mg by mouth daily.     . naproxen sodium (ANAPROX) 220 MG tablet Take 220 mg by mouth 2 (two) times daily as needed (pain). Reported on 02/13/2016    . ONE TOUCH ULTRA TEST test strip     . oxybutynin (DITROPAN-XL) 10 MG 24 hr tablet Take 10 mg by mouth at bedtime.    . pantoprazole (PROTONIX) 40 MG tablet Take 40 mg by mouth daily.    . verapamil (CALAN-SR) 180 MG CR tablet Take 180 mg by mouth daily.        Discharge Medications: Please see discharge summary for a list of discharge medications.  Relevant  Imaging Results:  Relevant Lab Results:   Additional Information  (SSN: [id number])  Avondre Richens, Veronia Beets, LCSW

## 2017-04-25 NOTE — ED Notes (Signed)
Patient's husband reports that patient's behavior was unusual this morning.  He states she was very weak and needed help getting to the bathroom and with breakfast, etc.  He stated that he had patient sit on the couch and he told her to "stay there" while he went to take a shower.  He stated several minutes later, patient was in the kitchen and stated, "I need your help!".  He states he ran to the room and patient had fallen on the floor.  Patient's husband states when EMS arrived and asked what had happened, patient stated her daughter's dogs had gotten under her feet.  Husband states, "my daughter's dogs are in [place]."  Husband states patient has been confused, but today seems more confused than normal.  Patient insisting to this RN at this time that she actually fell last night, not this morning.

## 2017-04-25 NOTE — ED Notes (Signed)
Patient adjusted in the bed and given a snack.  C-collar removed.

## 2017-04-25 NOTE — ED Notes (Signed)
Patient returned from CT scan and x-ray.  Patient is complaining of pain to her "tailbone."  MD notified.

## 2017-04-25 NOTE — ED Triage Notes (Signed)
Pt presents to ED via [facility] s/p fall this morning. Per EMS pt was going to the kitchen when something in the home touched her foot such as a stuffed animal and she fell. EMS reports hx of multiple recent falls, A&O x 4. Pt was seen here yesterday on 8/26 but pt states she does not remember being seen yesterday. Pt c/o neck and lower back pain at this time. EMS reports that patient was seen 2 weeks ago and dx with blood on her brain. Pt has hx of DM, HTN, and stroke.

## 2017-04-25 NOTE — ED Provider Notes (Signed)
[facility] Emergency Department Provider Note  Time seen: 9:39 AM  I have reviewed the triage vital signs and the nursing notes.   HISTORY  Chief Complaint Fall    HPI Kristen Guerra is a 76 y.o. female with a past medical history of anxiety, depression, diabetes, gastric reflux, hypertension, hyperlipidemia, CVA, presents to the emergency department after a fall. According to EMS report the patient had a fall this morning called EMS who placed the patient in a c-collar and brought the patient to the hospital. Patient was at the hospital 8/24 for a fall, yesterday 8/26 for a fall and now again today. Patient is an extremely poor historian and does not recall coming to the hospital yesterday, states her doctor sent her here today appears to be mildly confused. Patient's only complaint is of pain in her "tailbone." Patient is not sure if she hit her head, not sure if she passed out.  Past Medical History:  Diagnosis Date  . Anxiety   . Depression   . Diabetes mellitus without complication ([place])   . GERD (gastroesophageal reflux disease)   . High cholesterol   . Hypertension   . PSVT (paroxysmal supraventricular tachycardia) ([place])   . Sleep apnea   . Stroke ([place])   . SUI (stress urinary incontinence, female)     Patient Active Problem List   Diagnosis Date Noted  . Weakness 04/22/2017  . Dizziness 02/27/2017  . UTI (urinary tract infection) 02/27/2017  . GERD (gastroesophageal reflux disease) 02/27/2017  . HTN (hypertension) 02/27/2017  . HLD (hyperlipidemia) 02/27/2017  . Diabetes ([place]) 02/27/2017    Past Surgical History:  Procedure Laterality Date  . ABDOMINAL HYSTERECTOMY    . BREAST BIOPSY Right 1977   negative  . BREAST BIOPSY Right 1988   negative  . BREAST SURGERY    . CHOLECYSTECTOMY    . EYE SURGERY     cataract  . GALLBLADDER SURGERY      Prior to Admission medications   Medication Sig Start Date End Date Taking?  Authorizing Provider  aspirin EC 81 MG tablet Take 81 mg by mouth daily.     [provider]  atorvastatin (LIPITOR) 40 MG tablet Take 40 mg by mouth daily.    [provider]  buPROPion (WELLBUTRIN XL) 150 MG 24 hr tablet Take 150 mg by mouth daily.    [provider]  cephALEXin (KEFLEX) 500 MG capsule Take 1 capsule (500 mg total) by mouth 2 (two) times daily. 04/15/17 04/25/17  Gregor Hams, MD  Cholecalciferol (VITAMIN D PO) Take 400 Units by mouth daily.     [provider]  clopidogrel (PLAVIX) 75 MG tablet Take 75 mg by mouth daily.  04/16/14   [provider]  cyanocobalamin 500 MCG tablet Take 500 mcg by mouth daily.     [provider]  DULoxetine (CYMBALTA) 60 MG capsule Take 60 mg by mouth daily.  04/16/14   [provider]  glimepiride (AMARYL) 4 MG tablet Take 4 mg by mouth daily.     [provider]  insulin glargine (LANTUS) 100 unit/mL SOPN Inject 36 Units into the skin at bedtime.    [provider]  JANUVIA 100 MG tablet Take 100 mg by mouth daily.  04/22/14   [provider]  lamoTRIgine (LAMICTAL) 100 MG tablet Take 100 mg by mouth daily.     [provider]  Magnesium Oxide -Mg Supplement 400 MG CAPS Take 400 mg  by mouth daily.     [provider]  meclizine (ANTIVERT) 12.5 MG tablet Take 1 tablet (12.5 mg total) by mouth 2 (two) times daily as needed. 04/23/17   [name], MD  metoprolol succinate (TOPROL-XL) 25 MG 24 hr tablet Take 25 mg by mouth daily.  04/16/14   [provider]  naproxen sodium (ANAPROX) 220 MG tablet Take 220 mg by mouth 2 (two) times daily as needed (pain). Reported on 02/13/2016    [provider]  ONE TOUCH ULTRA TEST test strip  03/12/14   [provider]  oxybutynin (DITROPAN-XL) 10 MG 24 hr tablet Take 10 mg by mouth at bedtime.    [provider]  pantoprazole (PROTONIX) 40 MG tablet Take 40 mg by mouth  daily.    [provider]  verapamil (CALAN-SR) 180 MG CR tablet Take 180 mg by mouth daily.  04/16/14   [provider]    Allergies  Allergen Reactions  . Amoxicillin Itching and Other (See Comments)    Reaction: dizziness Has patient had a PCN reaction causing immediate rash, facial/tongue/throat swelling, SOB or lightheadedness with hypotension: No Has patient had a PCN reaction causing severe rash involving mucus membranes or skin necrosis: No Has patient had a PCN reaction that required hospitalization: No Has patient had a PCN reaction occurring within the last 10 years: Yes If all of the above answers are "NO", then may proceed with Cephalosporin use.   . Lidocaine Itching    Family History  Problem Relation Age of Onset  . Colon cancer Father   . Kidney cancer Neg Hx   . Bladder Cancer Neg Hx     Social History Social History  Substance Use Topics  . Smoking status: Never Smoker  . Smokeless tobacco: Never Used  . Alcohol use 0.0 oz/week     Comment: once a quarter    Review of Systems Constitutional: Negative for fever. Cardiovascular: Negative for chest pain. Respiratory: Negative for shortness of breath. Gastrointestinal: Negative for abdominal pain Musculoskeletal: Negative for back pain.  Positive for tailbone pain. Neurological: Negative for headache All other ROS negative  ____________________________________________   PHYSICAL EXAM:  VITAL SIGNS: ED Triage Vitals  Enc Vitals Group     BP 04/25/17 0923 (!) 103/59     Pulse Rate 04/25/17 0923 68     Resp 04/25/17 0923 18     Temp 04/25/17 0923 98.4 F (36.9 C)     Temp Source 04/25/17 0923 Oral     SpO2 04/25/17 0923 94 %     Weight 04/25/17 0904 198 lb (89.8 kg)     Height 04/25/17 0904 5\' 6"  (1.676 m)     Head Circumference --      [facility] --      Pain Score 04/25/17 0924 6     Pain Loc --      Pain Edu? --      Excl. in [place]? --     Constitutional: Alert, appears to  be confused and her timeline. No acute distress. Eyes: Normal exam ENT   Head: Normocephalic and atraumatic. C-collar in place. Mild neck tenderness on palpation.   Mouth/Throat: Mucous membranes are moist. Cardiovascular: Normal rate, regular rhythm. No murmur Respiratory: Normal respiratory effort without tachypnea nor retractions. Breath sounds are clear Gastrointestinal: Soft and nontender. No distention.   Musculoskeletal: Nontender with normal range of motion in all extremities. Good range of motion all extremities without pain. Neurologic:  Normal speech  and language. No gross focal neurologic deficits Skin:  Skin is warm, dry and intact.  Psychiatric: Mood and affect are normal.   ____________________________________________     RADIOLOGY  CT and x-ray showed no acute abnormality.  ____________________________________________   INITIAL IMPRESSION / ASSESSMENT AND PLAN / ED COURSE  Pertinent labs & imaging results that were available during my care of the patient were reviewed by me and considered in my medical decision making (see chart for details).  Patient presents to the emergency department after a fall. This is the patient's third fall in 3 days. We will perform a workup including CT head, C-spine, x-ray of her pelvis. We'll check labs including a urinalysis. Ultimately I do not believe the patient is safe to be discharged home given her ongoing falls. If medical workup is normal we will proceed with a short-term rehabilitation placement for the patient. I have already discussed this with the patient is agreeable.  No acute abnormality on imaging, labs are largely at baseline. No significant finding warranting medical admission at this time. I discussed the patient with the clinical social worker, patient has been evaluated by physical therapy and they recommend placement to a rehabilitation facility. [facility] will [facility] at [facility]  today.  ____________________________________________   FINAL CLINICAL IMPRESSION(S) / ED DIAGNOSES  Cheral Marker, MD 04/25/17 [phone]

## 2017-04-25 NOTE — Clinical Social Work Placement (Signed)
   CLINICAL SOCIAL WORK PLACEMENT  NOTE  Date:  04/25/2017  Patient Details  Name: Kristen Guerra MRN: [id number] Date of Birth: [birth year]  Clinical Social Work is seeking post-discharge placement for this patient at the [place] level of care (*CSW will initial, date and re-position this form in  chart as items are completed):  Yes   Patient/family provided with [facility] Work Department's list of facilities offering this level of care within the geographic area requested by the patient (or if unable, by the patient's family).  Yes   Patient/family informed of their freedom to choose among providers that offer the needed level of care, that participate in Medicare, Medicaid or managed care program needed by the patient, have an available bed and are willing to accept the patient.  Yes   Patient/family informed of [facility]'s ownership interest in [facility] and [facility], as well as of the fact that they are under no obligation to receive care at these facilities.  PASRR submitted to EDS on 04/25/17     PASRR number received on 04/25/17     Existing PASRR number confirmed on       FL2 transmitted to all facilities in geographic area requested by pt/family on 04/25/17     FL2 transmitted to all facilities within larger geographic area on       Patient informed that his/her managed care company has contracts with or will negotiate with certain facilities, including the following:        Yes   Patient/family informed of bed offers received.  [facility]     Physician recommends and patient chooses bed at      Patient to be transferred to  ([facility] ) on 04/25/17.  Patient to be transferred to facility by  [facility] )     Patient family notified on 04/25/17 of transfer.  Name of family member notified:   (Patient's husband Mallie Mussel and friend Rod Holler was at bedside and aware of D/C today. )      PHYSICIAN       Additional Comment:    _______________________________________________ Marvin Grabill, Veronia Beets, LCSW 04/25/2017, 4:21 PM

## 2017-04-25 NOTE — ED Notes (Signed)
Physical Therapist evaluating patient at this time.

## 2017-04-26 ENCOUNTER — Ambulatory Visit: Payer: Medicare Other | Admitting: Family Medicine

## 2017-04-26 DIAGNOSIS — R278 Other lack of coordination: Secondary | ICD-10-CM | POA: Insufficient documentation

## 2017-04-26 DIAGNOSIS — R42 Dizziness and giddiness: Secondary | ICD-10-CM | POA: Insufficient documentation

## 2017-04-27 NOTE — Discharge Summary (Signed)
[facility] [place] NAME: Kristen Guerra    MR#:  [id number]  DATE OF BIRTH:  03-08-1941  DATE OF ADMISSION:  04/22/2017 ADMITTING PHYSICIAN: Baxter Hire, MD  DATE OF DISCHARGE: 04/23/2017  1:00 PM  PRIMARY CARE PHYSICIAN: Idelle Crouch, MD   ADMISSION DIAGNOSIS:  Weakness [R53.1] Altered mental status, unspecified altered mental status type [R41.82]  DISCHARGE DIAGNOSIS:  Active Problems:   Weakness   SECONDARY DIAGNOSIS:   Past Medical History:  Diagnosis Date  . Anxiety   . Depression   . Diabetes mellitus without complication ([place])   . GERD (gastroesophageal reflux disease)   . High cholesterol   . Hypertension   . PSVT (paroxysmal supraventricular tachycardia) ([place])   . Sleep apnea   . Stroke ([place])   . SUI (stress urinary incontinence, female)      ADMITTING HISTORY  Chief Complaint: Weakness and dizziness HPI: This is 76 year old female who's had a history of a CVA about 2 months ago. Her complaint at that time was dizziness. However the location of the CVA was not an area that would cause dizziness. Since then she's continued to have on and off dizzy spells and falling. She says mainly happens when she gets up. However sometimes walking with a walker she will feel dizzy and fall. She was seen by neurology on her last inpatient admission and they thought the dizziness was positional. However she had no further episodes during that admission was not placed on any medications. She was seen last couple days by Dr. Melrose Nakayama as an outpatient but documentation is not available yet. Patient says she was told nothing was wrong with her. However she continues to have dizziness here in the ER. No other deficits. He also states she just feels generalized weakness. And she not has not been eating very well last few days. She denies any nausea or vomiting   HOSPITAL COURSE:   * Vertigo. Patient has been following with a  neurologist as outpatient. Was admitted due to worsening symptoms. Started on meclizine with which her symptoms improved. Was seen by Dr. Irish Elders of neurology who agreed that patient's symptoms are typical for working up and suggested outpatient neurology follow-up and physical therapy. Patient had MRI of the brain which showed no acute stroke. Showed [place] possible subtle subdural hematoma. Discussed with Dr. Irish Elders who did not suggest any acute treatment and close monitoring as outpatient. Patient worked with physical therapy and needed home health physical therapy which has been set up.  Stable for discharge home.  CONSULTS OBTAINED:    DRUG ALLERGIES:   Allergies  Allergen Reactions  . Amoxicillin Itching and Other (See Comments)    Reaction: dizziness Has patient had a PCN reaction causing immediate rash, facial/tongue/throat swelling, SOB or lightheadedness with hypotension: No Has patient had a PCN reaction causing severe rash involving mucus membranes or skin necrosis: No Has patient had a PCN reaction that required hospitalization: No Has patient had a PCN reaction occurring within the last 10 years: Yes If all of the above answers are "NO", then may proceed with Cephalosporin use.   . Lidocaine Itching    DISCHARGE MEDICATIONS:   Discharge Medication List as of 04/23/2017 12:27 PM    START taking these medications   Details  meclizine (ANTIVERT) 12.5 MG tablet Take 1 tablet (12.5 mg total) by mouth 2 (two) times daily as needed., Starting Sat 04/23/2017, Normal      CONTINUE these medications  which have NOT CHANGED   Details  aspirin EC 81 MG tablet Take 81 mg by mouth daily. , Historical Med    atorvastatin (LIPITOR) 40 MG tablet Take 40 mg by mouth daily., Historical Med    cephALEXin (KEFLEX) 500 MG capsule Take 1 capsule (500 mg total) by mouth 2 (two) times daily., Starting Fri 04/15/2017, Until Mon 04/25/2017, Print    Cholecalciferol (VITAMIN D PO) Take 400  Units by mouth daily. , Historical Med    clopidogrel (PLAVIX) 75 MG tablet Take 75 mg by mouth daily. , Starting Tue 04/16/2014, Historical Med    cyanocobalamin 500 MCG tablet Take 500 mcg by mouth daily. , Historical Med    DULoxetine (CYMBALTA) 60 MG capsule Take 60 mg by mouth daily. , Starting Tue 04/16/2014, Historical Med    glimepiride (AMARYL) 4 MG tablet Take 4 mg by mouth daily. , Historical Med    insulin glargine (LANTUS) 100 unit/mL SOPN Inject 36 Units into the skin at bedtime., Historical Med    JANUVIA 100 MG tablet Take 100 mg by mouth daily. , Starting Mon 04/22/2014, Historical Med    Magnesium Oxide -Mg Supplement 400 MG CAPS Take 400 mg by mouth daily. , Historical Med    metoprolol succinate (TOPROL-XL) 25 MG 24 hr tablet Take 25 mg by mouth daily. , Starting Tue 04/16/2014, Historical Med    naproxen sodium (ANAPROX) 220 MG tablet Take 220 mg by mouth 2 (two) times daily as needed (pain). Reported on 02/13/2016, Historical Med    ONE TOUCH ULTRA TEST test strip Historical Med    oxybutynin (DITROPAN-XL) 10 MG 24 hr tablet Take 10 mg by mouth at bedtime., Historical Med    pantoprazole (PROTONIX) 40 MG tablet Take 40 mg by mouth daily., Historical Med    verapamil (CALAN-SR) 180 MG CR tablet Take 180 mg by mouth daily. , Starting Tue 04/16/2014, Historical Med    buPROPion (WELLBUTRIN XL) 150 MG 24 hr tablet Take 150 mg by mouth daily., Historical Med    lamoTRIgine (LAMICTAL) 100 MG tablet Take 100 mg by mouth daily. , Historical Med      STOP taking these medications     hydrochlorothiazide (HYDRODIURIL) 25 MG tablet         Today   VITAL SIGNS:  Blood pressure 113/87, pulse 91, temperature 98 F (36.7 C), temperature source Oral, resp. rate 14, height 5\' 6"  (1.676 m), weight 90.1 kg (198 lb 9.6 oz), SpO2 94 %.  I/O:  No intake or output data in the 24 hours ending 04/27/17 1319  PHYSICAL EXAMINATION:  Physical Exam  GENERAL:  76 y.o.-year-old  patient lying in the bed with no acute distress.  LUNGS: Normal breath sounds bilaterally, no wheezing, rales,rhonchi or crepitation. No use of accessory muscles of respiration.  CARDIOVASCULAR: S1, S2 normal. No murmurs, rubs, or gallops.  ABDOMEN: Soft, non-tender, non-distended. Bowel sounds present. No organomegaly or mass.  NEUROLOGIC: Moves all 4 extremities. PSYCHIATRIC: The patient is alert and oriented x 3.  SKIN: No obvious rash, lesion, or ulcer.   DATA REVIEW:   CBC  Recent Labs Lab 04/25/17 0946  WBC 9.2  HGB 12.3  HCT 37.1  PLT 451*    Chemistries   Recent Labs Lab 04/25/17 0946  NA 143  K 4.1  CL 110  CO2 26  GLUCOSE 117*  BUN 14  CREATININE 1.22*  CALCIUM 9.9  AST 22  ALT 16  ALKPHOS 55  BILITOT 0.4  Cardiac Enzymes  Recent Labs Lab 04/22/17 1113  TROPONINI <0.03    Microbiology Results  Results for orders placed or performed during the hospital encounter of 04/15/17  Urine culture     Status: Abnormal   Collection Time: 04/15/17  4:40 AM  Result Value Ref Range Status   Specimen Description URINE, RANDOM  Final   Special Requests NONE  Final   Culture MULTIPLE SPECIES PRESENT, SUGGEST RECOLLECTION (A)  Final   Report Status 04/16/2017 FINAL  Final    RADIOLOGY:  No results found.  Follow up with PCP in 1 week.  Management plans discussed with the patient, family and they are in agreement.  CODE STATUS:  Code Status History    Date Active Date Inactive Code Status Order ID Comments User Context   04/22/2017  5:39 PM 04/23/2017  4:41 PM Full Code [id number]  Baxter Hire, MD Inpatient   02/28/2017  1:56 AM 03/01/2017  6:42 PM Full Code [id number]  Lance Coon, MD ED    Advance Directive Documentation     Most Recent Value  Type of Advance Directive  Healthcare Power of [place], Living will  Pre-existing out of facility DNR order (yellow form or pink MOST form)  -  "MOST" Form in Place?  -      TOTAL TIME TAKING CARE OF  THIS PATIENT ON DAY OF DISCHARGE: more than 30 minutes.   [name] R M.D on 04/27/2017 at 1:19 PM  Between 7am to 6pm - Pager - [phone]  After 6pm go to [email or web address] - password [email or web address] [facility]  Office  [phone]  CC: Primary care physician; Idelle Crouch, MD  Note: This dictation was prepared with Dragon dictation along with smaller phrase technology. Any transcriptional errors that result from this process are unintentional.

## 2017-04-30 ENCOUNTER — Emergency Department: Payer: Medicare Other

## 2017-04-30 ENCOUNTER — Emergency Department
Admission: EM | Admit: 2017-04-30 | Discharge: 2017-04-30 | Disposition: A | Discharge status: Other Acute Inpatient Hospital | Payer: Medicare Other | Attending: Emergency Medicine | Admitting: Emergency Medicine

## 2017-04-30 DIAGNOSIS — S0990XA Unspecified injury of head, initial encounter: Secondary | ICD-10-CM | POA: Diagnosis present

## 2017-04-30 DIAGNOSIS — Z79899 Other long term (current) drug therapy: Secondary | ICD-10-CM | POA: Insufficient documentation

## 2017-04-30 DIAGNOSIS — Z794 Long term (current) use of insulin: Secondary | ICD-10-CM | POA: Insufficient documentation

## 2017-04-30 DIAGNOSIS — Y9389 Activity, other specified: Secondary | ICD-10-CM | POA: Insufficient documentation

## 2017-04-30 DIAGNOSIS — S066X0A Traumatic subarachnoid hemorrhage without loss of consciousness, initial encounter: Secondary | ICD-10-CM

## 2017-04-30 DIAGNOSIS — I10 Essential (primary) hypertension: Secondary | ICD-10-CM | POA: Diagnosis not present

## 2017-04-30 DIAGNOSIS — S065X9A Traumatic subdural hemorrhage with loss of consciousness of unspecified duration, initial encounter: Secondary | ICD-10-CM

## 2017-04-30 DIAGNOSIS — W228XXA Striking against or struck by other objects, initial encounter: Secondary | ICD-10-CM | POA: Diagnosis not present

## 2017-04-30 DIAGNOSIS — Y999 Unspecified external cause status: Secondary | ICD-10-CM | POA: Diagnosis not present

## 2017-04-30 DIAGNOSIS — S065XAA Traumatic subdural hemorrhage with loss of consciousness status unknown, initial encounter: Secondary | ICD-10-CM

## 2017-04-30 DIAGNOSIS — W06XXXA Fall from bed, initial encounter: Secondary | ICD-10-CM | POA: Insufficient documentation

## 2017-04-30 DIAGNOSIS — W19XXXA Unspecified fall, initial encounter: Secondary | ICD-10-CM

## 2017-04-30 DIAGNOSIS — Y92121 Bathroom in nursing home as the place of occurrence of the external cause: Secondary | ICD-10-CM | POA: Diagnosis not present

## 2017-04-30 DIAGNOSIS — S0181XA Laceration without foreign body of other part of head, initial encounter: Secondary | ICD-10-CM | POA: Insufficient documentation

## 2017-04-30 DIAGNOSIS — E119 Type 2 diabetes mellitus without complications: Secondary | ICD-10-CM | POA: Diagnosis not present

## 2017-04-30 DIAGNOSIS — Z7982 Long term (current) use of aspirin: Secondary | ICD-10-CM | POA: Insufficient documentation

## 2017-04-30 LAB — CBC WITH DIFFERENTIAL/PLATELET
Basophils Absolute: 0.1 K/uL (ref 0–0.1)
Basophils Relative: 1 %
Eosinophils Absolute: 0.3 K/uL (ref 0–0.7)
Eosinophils Relative: 4 %
HCT: 35.9 % (ref 35.0–47.0)
Hemoglobin: 11.6 g/dL — ABNORMAL LOW (ref 12.0–16.0)
Lymphocytes Relative: 16 %
Lymphs Abs: 1.5 K/uL (ref 1.0–3.6)
MCH: 25.3 pg — ABNORMAL LOW (ref 26.0–34.0)
MCHC: 32.3 g/dL (ref 32.0–36.0)
MCV: 78.2 fL — ABNORMAL LOW (ref 80.0–100.0)
Monocytes Absolute: 0.5 K/uL (ref 0.2–0.9)
Monocytes Relative: 6 %
Neutro Abs: 6.6 K/uL — ABNORMAL HIGH (ref 1.4–6.5)
Neutrophils Relative %: 73 %
Platelets: 426 K/uL (ref 150–440)
RBC: 4.59 MIL/uL (ref 3.80–5.20)
RDW: 18 % — ABNORMAL HIGH (ref 11.5–14.5)
WBC: 9 K/uL (ref 3.6–11.0)

## 2017-04-30 LAB — COMPREHENSIVE METABOLIC PANEL WITH GFR
ALT: 17 U/L (ref 14–54)
AST: 25 U/L (ref 15–41)
Albumin: 3.7 g/dL (ref 3.5–5.0)
Alkaline Phosphatase: 48 U/L (ref 38–126)
Anion gap: 6 (ref 5–15)
BUN: 10 mg/dL (ref 6–20)
CO2: 27 mmol/L (ref 22–32)
Calcium: 9.2 mg/dL (ref 8.9–10.3)
Chloride: 107 mmol/L (ref 101–111)
Creatinine, Ser: 1.06 mg/dL — ABNORMAL HIGH (ref 0.44–1.00)
GFR calc Af Amer: 58 mL/min — ABNORMAL LOW
GFR calc non Af Amer: 50 mL/min — ABNORMAL LOW
Glucose, Bld: 95 mg/dL (ref 65–99)
Potassium: 4.1 mmol/L (ref 3.5–5.1)
Sodium: 140 mmol/L (ref 135–145)
Total Bilirubin: 0.6 mg/dL (ref 0.3–1.2)
Total Protein: 6.9 g/dL (ref 6.5–8.1)

## 2017-04-30 LAB — URINALYSIS, COMPLETE (UACMP) WITH MICROSCOPIC
Bacteria, UA: NONE SEEN
Bilirubin Urine: NEGATIVE
Glucose, UA: NEGATIVE mg/dL
Hgb urine dipstick: NEGATIVE
Ketones, ur: NEGATIVE mg/dL
Leukocytes, UA: NEGATIVE
Nitrite: NEGATIVE
Protein, ur: NEGATIVE mg/dL
Specific Gravity, Urine: 1.015 (ref 1.005–1.030)
Squamous Epithelial / HPF: NONE SEEN
pH: 5 (ref 5.0–8.0)

## 2017-04-30 LAB — TROPONIN I

## 2017-04-30 LAB — GLUCOSE, CAPILLARY: Glucose-Capillary: 98 mg/dL (ref 65–99)

## 2017-04-30 NOTE — ED Triage Notes (Signed)
Pt presents after a fall at [facility]. Pt has mild confusion. Pt fell upon getting up from bed to go to the bathroom. Pt states she was told not to get up by herself, but she did anyway.

## 2017-04-30 NOTE — ED Notes (Signed)
[facility]TAL[facility] reviewed. 

## 2017-04-30 NOTE — ED Provider Notes (Signed)
[facility] Emergency Department Provider Note   ____________________________________________   First MD Initiated Contact with Patient 04/30/17 [phone]     (approximate)  I have reviewed the triage vital signs and the nursing notes.   HISTORY  Chief Complaint Fall and Head Laceration    HPI Kristen Guerra is a 76 y.o. female brought to the ED from skilled nursing facility with a chief complaint of fall. This is patient's fourth fall with ED evaluation since 8/24. On her most recent visit, [facility] was [facility]. States she got up by herself which she is not supposed to do, lost her balance and fell. Struck the nightstand with her left head. Denies LOC. Complains of head pain and right hip Denies leg pain, chest pain, shortness of breath, abdominal pain, nausea, vomiting. Spouse states patient gets numerous UTIs.   Past Medical History:  Diagnosis Date  . Anxiety   . Depression   . Diabetes mellitus without complication ([name])   . GERD (gastroesophageal reflux disease)   . High cholesterol   . Hypertension   . PSVT (paroxysmal supraventricular tachycardia) ([name])   . Sleep apnea   . Stroke ([name])   . SUI (stress urinary incontinence, female)     Patient Active Problem List   Diagnosis Date Noted  . Weakness 04/22/2017  . Dizziness 02/27/2017  . UTI (urinary tract infection) 02/27/2017  . GERD (gastroesophageal reflux disease) 02/27/2017  . HTN (hypertension) 02/27/2017  . HLD (hyperlipidemia) 02/27/2017  . Diabetes ([name]) 02/27/2017    Past Surgical History:  Procedure Laterality Date  . ABDOMINAL HYSTERECTOMY    . BREAST BIOPSY Right 1977   negative  . BREAST BIOPSY Right 1988   negative  . BREAST SURGERY    . CHOLECYSTECTOMY    . EYE SURGERY     cataract  . GALLBLADDER SURGERY      Prior to Admission medications   Medication Sig Start Date End Date Taking? Authorizing Provider  aspirin EC 81 MG tablet Take 81 mg by  mouth daily.     [provider]  atorvastatin (LIPITOR) 40 MG tablet Take 40 mg by mouth daily.    [provider]  buPROPion (WELLBUTRIN XL) 150 MG 24 hr tablet Take 1 tablet (150 mg total) by mouth daily. 04/25/17   Harvest Dark, MD  Cholecalciferol (VITAMIN D PO) Take 400 Units by mouth daily.     [provider]  clopidogrel (PLAVIX) 75 MG tablet Take 75 mg by mouth daily.  04/16/14   [provider]  cyanocobalamin 500 MCG tablet Take 500 mcg by mouth daily.     [provider]  DULoxetine (CYMBALTA) 60 MG capsule Take 60 mg by mouth daily.  04/16/14   [provider]  glimepiride (AMARYL) 4 MG tablet Take 4 mg by mouth daily.     [provider]  insulin glargine (LANTUS) 100 unit/mL SOPN Inject 36 Units into the skin at bedtime.    [provider]  JANUVIA 100 MG tablet Take 100 mg by mouth daily.  04/22/14   [provider]  lamoTRIgine (LAMICTAL) 100 MG tablet Take 1 tablet (100 mg total) by mouth daily. 04/25/17   Harvest Dark, MD  Magnesium Oxide -Mg Supplement 400 MG CAPS Take 400 mg by mouth daily.     [provider]  meclizine (ANTIVERT) 12.5 MG tablet Take 1 tablet (12.5 mg total) by mouth 2 (two) times daily as needed. 04/23/17  [name], MD  metoprolol succinate (TOPROL-XL) 25 MG 24 hr tablet Take 25 mg by mouth daily.  04/16/14   [provider]  mirabegron ER (MYRBETRIQ) 50 MG TB24 tablet Take 50 mg by mouth daily.    [provider]  naproxen sodium (ANAPROX) 220 MG tablet Take 220 mg by mouth 2 (two) times daily as needed (pain). Reported on 02/13/2016    [provider]  ONE TOUCH ULTRA TEST test strip  03/12/14   [provider]  oxybutynin (DITROPAN-XL) 10 MG 24 hr tablet Take 10 mg by mouth at bedtime.    [provider]  pantoprazole (PROTONIX) 40 MG tablet Take 40 mg by mouth daily.    [provider]  verapamil  (CALAN-SR) 180 MG CR tablet Take 180 mg by mouth daily.  04/16/14   [provider]    Allergies Amoxicillin and Lidocaine  Family History  Problem Relation Age of Onset  . Colon cancer Father   . Kidney cancer Neg Hx   . Bladder Cancer Neg Hx     Social History Social History  Substance Use Topics  . Smoking status: Never Smoker  . Smokeless tobacco: Never Used  . Alcohol use 0.0 oz/week     Comment: once a quarter    Review of Systems  Constitutional: No fever/chills Eyes: No visual changes. ENT: No sore throat. Cardiovascular: Denies chest pain. Respiratory: Denies shortness of breath. Gastrointestinal: No abdominal pain.  No nausea, no vomiting.  No diarrhea.  No constipation. Genitourinary: Negative for dysuria. Musculoskeletal: positive for right hip pain. Negative for back pain. Skin: Negative for rash. Neurological: Negative for headaches, focal weakness or numbness.   ____________________________________________   PHYSICAL EXAM:  VITAL SIGNS: ED Triage Vitals  Enc Vitals Group     BP 04/30/17 0431 (!) 144/60     Pulse Rate 04/30/17 0431 65     Resp 04/30/17 0431 20     Temp 04/30/17 0431 98.3 F (36.8 C)     Temp Source 04/30/17 0431 Oral     SpO2 04/30/17 0431 96 %     Weight 04/30/17 0432 196 lb (88.9 kg)     Height 04/30/17 0432 5\' 6"  (1.676 m)     Head Circumference --      [facility] --      Pain Score 04/30/17 0430 6     Pain Loc --      Pain Edu? --      Excl. in [place]? --     Constitutional: Alert and oriented. Well appearing and in no acute distress. Eyes: Conjunctivae are normal. PERRL. EOMI. Head: 1 cm stellate laceration to left face near her ear, bleeding controlled. Nose: No congestion/rhinnorhea. Mouth/Throat: Mucous membranes are moist.  Oropharynx non-erythematous. Neck: No stridor.  No cervical spine tenderness to palpation. Cardiovascular: Normal rate, regular rhythm. Grossly normal heart sounds.  Good peripheral  circulation. Respiratory: Normal respiratory effort.  No retractions. Lungs CTAB. Gastrointestinal: Soft and nontender. No distention. No abdominal bruits. No CVA tenderness. Musculoskeletal: pelvis stable. Right hip tender to palpation. FROM with some pain. 2+ femoral and distal pulses. Neurologic:  Alert and oriented x 1. Normal speech and language. No gross focal neurologic deficits are appreciated.  Skin:  Skin is warm, dry and intact. No rash noted. Psychiatric: Mood and affect are normal. Speech and behavior are normal.  ____________________________________________   LABS (all labs ordered are listed, but only abnormal results are displayed)  Labs Reviewed  CBC  WITH DIFFERENTIAL/PLATELET - Abnormal; Notable for the following:       Result Value   Hemoglobin 11.6 (*)    MCV 78.2 (*)    MCH 25.3 (*)    RDW 18.0 (*)    Neutro Abs 6.6 (*)    All other components within normal limits  COMPREHENSIVE METABOLIC PANEL - Abnormal; Notable for the following:    Creatinine, Ser 1.06 (*)    GFR calc non Af Amer 50 (*)    GFR calc Af Amer 58 (*)    All other components within normal limits  URINALYSIS, COMPLETE (UACMP) WITH MICROSCOPIC - Abnormal; Notable for the following:    Color, Urine YELLOW (*)    APPearance CLEAR (*)    All other components within normal limits  TROPONIN I  GLUCOSE, CAPILLARY   ____________________________________________  EKG  ED ECG REPORT I, Jacqueline Spofford J, the attending physician, personally viewed and interpreted this ECG.   Date: 04/30/2017  EKG Time: 0435  Rate: 68  Rhythm: normal EKG, normal sinus rhythm  Axis: normal  Intervals:none  ST&T Change: nonspecific  ____________________________________________  RADIOLOGY  Ct Head Wo Contrast  Result Date: 04/30/2017 CLINICAL DATA:  76 y/o  F; status post fall and mild confusion. EXAM: CT HEAD WITHOUT CONTRAST TECHNIQUE: Contiguous axial images were obtained from the base of the skull through the  vertex without intravenous contrast. COMPARISON:  04/25/2017 CT head FINDINGS: Brain: The subdural collection over the right cerebral convexity is increased in density and mildly increased in thickness measuring up to 5 mm compatible with a small volume of acute hemorrhage. Additionally, there is a small volume of subarachnoid hemorrhage within the right sylvian fissure. No acute brain parenchymal hemorrhage or infarction identified. Stable chronic microvascular ischemic changes and parenchymal volume loss of the brain. 4 mm right-to-left midline shift. Vascular: Calcific atherosclerosis of carotid siphons. Skull: Slight soft tissue thickening of the left frontal scalp above the left orbital rim may represent a small scalp contusion. Sinuses/Orbits: No acute finding. Bilateral intra-ocular lens replacement. Other: Choose IMPRESSION: 1. Subdural hematoma over right cerebral convexity is increased in density and mildly increased in thickness measuring up to 5 mm indicating interval acute hemorrhage. 4 mm right to left midline shift is also increased. 2. Small volume of acute subarachnoid hemorrhage in the right sylvian fissure. 3. Left frontal scalp slight soft tissue thickening may represent a small contusion. These results were called by telephone at the time of interpretation on 04/30/2017 at 5:52 am to Dr. Lurline Hare , who verbally acknowledged these results. Electronically Signed   By: Kristine Garbe M.D.   On: 04/30/2017 05:55   Dg Hip Unilat  With Pelvis 2-3 Views Right  Result Date: 04/30/2017 CLINICAL DATA:  Pain and confusion. Fell upon getting up from bed to go the bathroom tonight. EXAM: DG HIP (WITH OR WITHOUT PELVIS) 2-3V RIGHT COMPARISON:  None. FINDINGS: Negative for acute fracture or dislocation. Moderate right hip arthritis. Bony pelvis appears intact. Pubic symphysis and sacroiliac joints appear intact. IMPRESSION: Negative for acute fracture or dislocation. Moderate right hip arthritis.  Electronically Signed   By: Andreas Newport M.D.   On: 04/30/2017 05:34    ____________________________________________   PROCEDURES  Procedure(s) performed: None  Procedures  Critical Care performed:   CRITICAL CARE Performed by: Paulette Blanch   Total critical care time: 30 minutes  Critical care time was exclusive of separately billable procedures and treating other patients.  Critical care was necessary to treat or prevent  imminent or life-threatening deterioration.  Critical care was time spent personally by me on the following activities: development of treatment plan with patient and/or surrogate as well as nursing, discussions with consultants, evaluation of patient's response to treatment, examination of patient, obtaining history from patient or surrogate, ordering and performing treatments and interventions, ordering and review of laboratory studies, ordering and review of radiographic studies, pulse oximetry and re-evaluation of patient's condition.  ____________________________________________   INITIAL IMPRESSION / ASSESSMENT AND PLAN / ED COURSE  Pertinent labs & imaging results that were available during my care of the patient were reviewed by me and considered in my medical decision making (see chart for details).  76 year old female brought from SNF for mechanical fall with right hip pain and left facial laceration. History of frequent falls, recently placed in SNF for this reason. Strong odor of urine about her. Will obtain screening lab work, urinalysis, hip x-rays, CT head and reassess.  Clinical Course as of May 01 639  Sat Apr 30, 2017  0603 Spoke with Dr. Toney Reil from radiology; updated patient and spouse of CT results. On 8/24 patient had MRI which demonstrated bilateral tiny subdural hematomas. This morning there is acute hemorrhage plus subarachnoid blood with 76mm midline shift. Will call [facility] transfer center to effect transfer.  [JS]  [id number] Patient  accepted by Dr. Jadene Pierini [facility] ED). Patient and spouse updated and agreeable with plan of care.  [JS]    Clinical Course User Index [JS] Paulette Blanch, MD     ____________________________________________   FINAL CLINICAL IMPRESSION(S) / ED DIAGNOSES  Final diagnoses:  Fall, initial encounter  Facial laceration, initial encounter  Injury of head, initial encounter  Subarachnoid hematoma, without loss of consciousness, initial encounter ([place])  Subdural hematoma ([place])      NEW MEDICATIONS STARTED DURING THIS VISIT:  New Prescriptions   No medications on file     Note:  This document was prepared using Dragon voice recognition software and may include unintentional dictation errors.    Paulette Blanch, MD 04/30/17 0730

## 2017-05-01 DIAGNOSIS — S066X0A Traumatic subarachnoid hemorrhage without loss of consciousness, initial encounter: Secondary | ICD-10-CM | POA: Insufficient documentation

## 2017-05-30 ENCOUNTER — Other Ambulatory Visit: Payer: Self-pay | Admitting: Internal Medicine

## 2017-05-30 DIAGNOSIS — S065XAA Traumatic subdural hemorrhage with loss of consciousness status unknown, initial encounter: Secondary | ICD-10-CM

## 2017-05-30 DIAGNOSIS — S065X9A Traumatic subdural hemorrhage with loss of consciousness of unspecified duration, initial encounter: Secondary | ICD-10-CM

## 2017-06-10 ENCOUNTER — Ambulatory Visit
Admission: RE | Admit: 2017-06-10 | Discharge: 2017-06-10 | Disposition: A | Discharge status: Home / Self Care | Payer: Medicare Other | Source: Ambulatory Visit | Attending: Internal Medicine | Admitting: Internal Medicine

## 2017-06-10 DIAGNOSIS — S065X9A Traumatic subdural hemorrhage with loss of consciousness of unspecified duration, initial encounter: Secondary | ICD-10-CM

## 2017-06-10 DIAGNOSIS — Z8673 Personal history of transient ischemic attack (TIA), and cerebral infarction without residual deficits: Secondary | ICD-10-CM | POA: Insufficient documentation

## 2017-06-10 DIAGNOSIS — X58XXXA Exposure to other specified factors, initial encounter: Secondary | ICD-10-CM | POA: Insufficient documentation

## 2017-06-10 DIAGNOSIS — I10 Essential (primary) hypertension: Secondary | ICD-10-CM | POA: Diagnosis not present

## 2017-06-10 DIAGNOSIS — S065XAA Traumatic subdural hemorrhage with loss of consciousness status unknown, initial encounter: Secondary | ICD-10-CM

## 2017-10-18 ENCOUNTER — Other Ambulatory Visit: Payer: Self-pay | Admitting: Internal Medicine

## 2017-10-18 DIAGNOSIS — Z1231 Encounter for screening mammogram for malignant neoplasm of breast: Secondary | ICD-10-CM

## 2017-10-24 ENCOUNTER — Ambulatory Visit
Admission: RE | Admit: 2017-10-24 | Discharge: 2017-10-24 | Disposition: A | Discharge status: Home / Self Care | Payer: Medicare Other | Source: Ambulatory Visit | Attending: Internal Medicine | Admitting: Internal Medicine

## 2017-10-24 DIAGNOSIS — R928 Other abnormal and inconclusive findings on diagnostic imaging of breast: Secondary | ICD-10-CM | POA: Insufficient documentation

## 2017-10-24 DIAGNOSIS — Z1231 Encounter for screening mammogram for malignant neoplasm of breast: Secondary | ICD-10-CM | POA: Insufficient documentation

## 2017-10-31 ENCOUNTER — Other Ambulatory Visit: Payer: Self-pay | Admitting: Internal Medicine

## 2017-10-31 DIAGNOSIS — N6310 Unspecified lump in the right breast, unspecified quadrant: Secondary | ICD-10-CM

## 2017-10-31 DIAGNOSIS — R928 Other abnormal and inconclusive findings on diagnostic imaging of breast: Secondary | ICD-10-CM

## 2017-11-01 ENCOUNTER — Ambulatory Visit
Admission: RE | Admit: 2017-11-01 | Discharge: 2017-11-01 | Disposition: A | Discharge status: Home / Self Care | Payer: Medicare Other | Source: Ambulatory Visit | Attending: Internal Medicine | Admitting: Internal Medicine

## 2017-11-01 DIAGNOSIS — N6310 Unspecified lump in the right breast, unspecified quadrant: Secondary | ICD-10-CM | POA: Insufficient documentation

## 2017-11-01 DIAGNOSIS — R928 Other abnormal and inconclusive findings on diagnostic imaging of breast: Secondary | ICD-10-CM

## 2017-11-06 ENCOUNTER — Emergency Department: Payer: Medicare Other

## 2017-11-06 ENCOUNTER — Other Ambulatory Visit: Payer: Self-pay

## 2017-11-06 DIAGNOSIS — R1011 Right upper quadrant pain: Secondary | ICD-10-CM | POA: Diagnosis not present

## 2017-11-06 DIAGNOSIS — E119 Type 2 diabetes mellitus without complications: Secondary | ICD-10-CM | POA: Insufficient documentation

## 2017-11-06 DIAGNOSIS — Z7982 Long term (current) use of aspirin: Secondary | ICD-10-CM | POA: Insufficient documentation

## 2017-11-06 DIAGNOSIS — Z79899 Other long term (current) drug therapy: Secondary | ICD-10-CM | POA: Insufficient documentation

## 2017-11-06 DIAGNOSIS — I10 Essential (primary) hypertension: Secondary | ICD-10-CM | POA: Insufficient documentation

## 2017-11-06 DIAGNOSIS — Z794 Long term (current) use of insulin: Secondary | ICD-10-CM | POA: Insufficient documentation

## 2017-11-06 DIAGNOSIS — R161 Splenomegaly, not elsewhere classified: Secondary | ICD-10-CM | POA: Insufficient documentation

## 2017-11-06 DIAGNOSIS — R188 Other ascites: Secondary | ICD-10-CM | POA: Diagnosis not present

## 2017-11-06 LAB — COMPREHENSIVE METABOLIC PANEL
ALK PHOS: 74 U/L (ref 38–126)
ALT: 24 U/L (ref 14–54)
ANION GAP: 7 (ref 5–15)
AST: 24 U/L (ref 15–41)
Albumin: 4 g/dL (ref 3.5–5.0)
BILIRUBIN TOTAL: 0.5 mg/dL (ref 0.3–1.2)
BUN: 13 mg/dL (ref 6–20)
CALCIUM: 9.4 mg/dL (ref 8.9–10.3)
CO2: 27 mmol/L (ref 22–32)
CREATININE: 0.96 mg/dL (ref 0.44–1.00)
Chloride: 105 mmol/L (ref 101–111)
GFR calc non Af Amer: 56 mL/min — ABNORMAL LOW (ref >60)
Glucose, Bld: 234 mg/dL — ABNORMAL HIGH (ref 65–99)
Potassium: 4 mmol/L (ref 3.5–5.1)
Sodium: 139 mmol/L (ref 135–145)
Total Protein: 7.5 g/dL (ref 6.5–8.1)

## 2017-11-06 LAB — CBC
HCT: 40.3 % (ref 35.0–47.0)
Hemoglobin: 12.6 g/dL (ref 12.0–16.0)
MCH: 24 pg — AB (ref 26.0–34.0)
MCHC: 31.3 g/dL — ABNORMAL LOW (ref 32.0–36.0)
MCV: 76.6 fL — AB (ref 80.0–100.0)
PLATELETS: 437 10*3/uL (ref 150–440)
RBC: 5.26 MIL/uL — ABNORMAL HIGH (ref 3.80–5.20)
RDW: 18 % — AB (ref 11.5–14.5)
WBC: 11.5 10*3/uL — AB (ref 3.6–11.0)

## 2017-11-06 LAB — TROPONIN I: Troponin I: <0.03 ng/mL (ref <0.03)

## 2017-11-06 LAB — LIPASE, BLOOD: Lipase: 28 U/L (ref 11–51)

## 2017-11-06 NOTE — ED Triage Notes (Addendum)
Pt reports pain to right upper quadrant that radiates around to her back and up into her right shoulder; pt reports non productive cough and sinus drainage; pain worse with cough, deep breath and movement; pt talking in complete coherent sentences;

## 2017-11-07 ENCOUNTER — Emergency Department: Payer: Medicare Other

## 2017-11-07 ENCOUNTER — Emergency Department
Admission: EM | Admit: 2017-11-07 | Discharge: 2017-11-07 | Disposition: A | Discharge status: Home / Self Care | Payer: Medicare Other | Attending: Emergency Medicine | Admitting: Emergency Medicine

## 2017-11-07 ENCOUNTER — Encounter: Payer: Self-pay | Admitting: Radiology

## 2017-11-07 DIAGNOSIS — R188 Other ascites: Secondary | ICD-10-CM

## 2017-11-07 DIAGNOSIS — R161 Splenomegaly, not elsewhere classified: Secondary | ICD-10-CM

## 2017-11-07 DIAGNOSIS — R1011 Right upper quadrant pain: Secondary | ICD-10-CM

## 2017-11-07 MED ORDER — IOPAMIDOL (ISOVUE-300) INJECTION 61%
100.0000 mL | Freq: Once | INTRAVENOUS | Status: AC | PRN
  Administered 2017-11-07: 100 mL via INTRAVENOUS

## 2017-11-07 NOTE — ED Notes (Signed)
ED Provider at bedside. 

## 2017-11-07 NOTE — Discharge Instructions (Signed)
You have been seen in the Emergency Department (ED) for abdominal pain.  Your evaluation did not identify a clear cause of your symptoms but was generally reassuring.  As we discussed, you have an enlarged spleen, and you have a small amount of free fluid in your abdomen (called "ascites") which could be coming from your liver.  Your lab work including LFTs, bilirubin, and lipase were all normal.  A hepatitis panel is currently pending.  Please let your GI provider(s) know about these results (we recommend you bring this sheet of paper with you to your follow up appointment).  Please follow up as instructed above regarding today?s emergent visit and the symptoms that are bothering you.  Take over-the-counter pain medication such as Aleve and/or Tylenol as needed and according to label instructions.  Return to the ED if your abdominal pain worsens or fails to improve, you develop bloody vomiting, bloody diarrhea, you are unable to tolerate fluids due to vomiting, fever greater than 101, or other symptoms that concern you.

## 2017-11-07 NOTE — ED Notes (Signed)
Patient c/o mid/lateral right-sided abdominal pain radiating to back beginning yesterday. Patient denies N/V, reports 1 episode of diarrhea after an episode of constipation.  Patient c/o cough X 1 year.

## 2017-11-07 NOTE — ED Provider Notes (Signed)
[facility] Emergency Department Provider Note  ____________________________________________   First MD Initiated Contact with Patient 11/07/17 0308     (approximate)  I have reviewed the triage vital signs and the nursing notes.   HISTORY  Chief Complaint Cough; Abdominal Pain; and Chest Pain    HPI Kristen Guerra is a 77 y.o. female with medical history as listed below which also includes cholecystectomy about 20 years ago.  She presents by private vehicle for evaluation of abdominal pain that is worse in the right upper quadrant and radiates through to her back and up into her right shoulder.  She also has had a nonproductive cough recently and some sinus drainage.  She reports that the pain has been going on for about 24 hours.  She has had no nausea nor vomiting and one episode of loose stool.  She is not having any trouble breathing and her cough is been going on for a year.  Nothing in particular makes his symptoms better and she is concerned based on the persistence of the pain after 24 hours.  She describes it as severe.  She denies fever/chills, chest pain, shortness of breath, nausea, vomiting, and dysuria.  Past Medical History:  Diagnosis Date  . Anxiety   . Depression   . Diabetes mellitus without complication ([place])   . GERD (gastroesophageal reflux disease)   . High cholesterol   . Hypertension   . PSVT (paroxysmal supraventricular tachycardia) ([place])   . Sleep apnea   . Stroke ([place])   . SUI (stress urinary incontinence, female)     Patient Active Problem List   Diagnosis Date Noted  . Weakness 04/22/2017  . Dizziness 02/27/2017  . UTI (urinary tract infection) 02/27/2017  . GERD (gastroesophageal reflux disease) 02/27/2017  . HTN (hypertension) 02/27/2017  . HLD (hyperlipidemia) 02/27/2017  . Diabetes ([place]) 02/27/2017    Past Surgical History:  Procedure Laterality Date  . ABDOMINAL HYSTERECTOMY    . BREAST BIOPSY Right  1977   negative  . BREAST BIOPSY Right 1988   negative  . BREAST SURGERY    . CHOLECYSTECTOMY    . EYE SURGERY     cataract  . GALLBLADDER SURGERY      Prior to Admission medications   Medication Sig Start Date End Date Taking? Authorizing Provider  aspirin EC 81 MG tablet Take 81 mg by mouth daily.     [provider]  atorvastatin (LIPITOR) 40 MG tablet Take 40 mg by mouth daily.    [provider]  buPROPion (WELLBUTRIN XL) 150 MG 24 hr tablet Take 1 tablet (150 mg total) by mouth daily. 04/25/17   Harvest Dark, MD  Cholecalciferol (VITAMIN D PO) Take 400 Units by mouth daily.     [provider]  clopidogrel (PLAVIX) 75 MG tablet Take 75 mg by mouth daily.  04/16/14   [provider]  cyanocobalamin 500 MCG tablet Take 500 mcg by mouth daily.     [provider]  DULoxetine (CYMBALTA) 60 MG capsule Take 60 mg by mouth daily.  04/16/14   [provider]  glimepiride (AMARYL) 4 MG tablet Take 4 mg by mouth daily.     [provider]  insulin glargine (LANTUS) 100 unit/mL SOPN Inject 36 Units into the skin at bedtime.    [provider]  JANUVIA 100 MG tablet Take 100 mg by mouth daily.  04/22/14   [provider]  lamoTRIgine (LAMICTAL) 100 MG tablet Take  1 tablet (100 mg total) by mouth daily. 04/25/17   Harvest Dark, MD  Magnesium Oxide -Mg Supplement 400 MG CAPS Take 400 mg by mouth daily.     [provider]  meclizine (ANTIVERT) 12.5 MG tablet Take 1 tablet (12.5 mg total) by mouth 2 (two) times daily as needed. 04/23/17   [name], MD  metoprolol succinate (TOPROL-XL) 25 MG 24 hr tablet Take 25 mg by mouth daily.  04/16/14   [provider]  mirabegron ER (MYRBETRIQ) 50 MG TB24 tablet Take 50 mg by mouth daily.    [provider]  naproxen sodium (ANAPROX) 220 MG tablet Take 220 mg by mouth 2 (two) times daily as needed (pain). Reported on 02/13/2016    [provider]  ONE TOUCH ULTRA TEST test strip  03/12/14   [provider]  oxybutynin (DITROPAN-XL) 10 MG 24 hr tablet Take 10 mg by mouth at bedtime.    [provider]  pantoprazole (PROTONIX) 40 MG tablet Take 40 mg by mouth daily.    [provider]  verapamil (CALAN-SR) 180 MG CR tablet Take 180 mg by mouth daily.  04/16/14   [provider]    Allergies Amoxicillin and Lidocaine  Family History  Problem Relation Age of Onset  . Colon cancer Father   . Kidney cancer Neg Hx   . Bladder Cancer Neg Hx   . Breast cancer Neg Hx     Social History Social History   Tobacco Use  . Smoking status: Never Smoker  . Smokeless tobacco: Never Used  Substance Use Topics  . Alcohol use: Yes    Alcohol/week: 0.0 oz    Comment: once a quarter  . Drug use: No    Review of Systems Constitutional: No fever/chills Eyes: No visual changes. ENT: No sore throat. Cardiovascular: Denies chest pain. Respiratory: Denies shortness of breath. Gastrointestinal: Abdominal pain as described above, no nausea nor vomiting, one episode of loose stool. Genitourinary: Negative for dysuria. Musculoskeletal: Negative for neck pain.  Negative for back pain. Integumentary: Negative for rash. Neurological: Negative for headaches, focal weakness or numbness.   ____________________________________________   PHYSICAL EXAM:  VITAL SIGNS: ED Triage Vitals  Enc Vitals Group     BP 11/06/17 2147 129/66     Pulse Rate 11/06/17 2147 81     Resp 11/06/17 2147 18     Temp 11/06/17 2147 99.2 F (37.3 C)     Temp Source 11/06/17 2147 Oral     SpO2 11/06/17 2147 97 %     Weight 11/06/17 2149 86.2 kg (190 lb)     Height 11/06/17 2149 1.676 m (5\' 6" )     Head Circumference --      Peak Flow --      Pain Score 11/06/17 2149 9     Pain Loc --      Pain Edu? --      Excl. in [place]? --     Constitutional: Alert and oriented. Well appearing and in no acute distress. Eyes:  Conjunctivae are normal.  Head: Atraumatic. Nose: No congestion/rhinnorhea. Mouth/Throat: Mucous membranes are moist. Neck: No stridor.  No meningeal signs.   Cardiovascular: Normal rate, regular rhythm. Good peripheral circulation. Grossly normal heart sounds. Respiratory: Normal respiratory effort.  No retractions. Lungs CTAB. Gastrointestinal: Soft with no distention.  Tenderness to palpation throughout the abdomen but more notable in the right lower quadrant.  There is some degree of rebound tenderness but no guarding. Musculoskeletal:  No lower extremity tenderness nor edema. No gross deformities of extremities. Neurologic:  Normal speech and language. No gross focal neurologic deficits are appreciated.  Skin:  Skin is warm, dry and intact. No rash noted. Psychiatric: Mood and affect are normal. Speech and behavior are normal.  ____________________________________________   LABS (all labs ordered are listed, but only abnormal results are displayed)  Labs Reviewed  CBC - Abnormal; Notable for the following components:      Result Value   WBC 11.5 (*)    RBC 5.26 (*)    MCV 76.6 (*)    MCH 24.0 (*)    MCHC 31.3 (*)    RDW 18.0 (*)    All other components within normal limits  COMPREHENSIVE METABOLIC PANEL - Abnormal; Notable for the following components:   Glucose, Bld 234 (*)    GFR calc non Af Amer 56 (*)    All other components within normal limits  TROPONIN I  LIPASE, BLOOD  HEPATITIS PANEL, ACUTE   ____________________________________________  EKG  ED ECG REPORT I, Hinda Kehr, the attending physician, personally viewed and interpreted this ECG.  Date: 11/06/2017 EKG Time: 22: 12 Rate: 83 Rhythm: normal sinus rhythm QRS Axis: normal Intervals: normal ST/T Wave abnormalities: normal Narrative Interpretation: no evidence of acute ischemia  ____________________________________________  RADIOLOGY   ED MD interpretation: No evidence of acute abnormality on  chest x-ray.  Splenomegaly and a small amount of ascites on CT scan.  Official radiology report(s): Dg Chest 2 View  Result Date: 11/06/2017 CLINICAL DATA:  77 year old female with chest pain and cough. EXAM: CHEST - 2 VIEW COMPARISON:  Chest radiograph dated 04/15/2017 FINDINGS: The heart size and mediastinal contours are within normal limits. Both lungs are clear. The visualized skeletal structures are unremarkable. IMPRESSION: No active cardiopulmonary disease. Electronically Signed   By: Anner Crete M.D.   On: 11/06/2017 22:48   Ct Abdomen Pelvis W Contrast  Result Date: 11/07/2017 CLINICAL DATA:  78 y/o F; right upper quadrant abdominal pain radiating around the back into the right shoulder. EXAM: CT ABDOMEN AND PELVIS WITH CONTRAST TECHNIQUE: Multidetector CT imaging of the abdomen and pelvis was performed using the standard protocol following bolus administration of intravenous contrast. CONTRAST:  176mL ISOVUE-300 IOPAMIDOL (ISOVUE-300) INJECTION 61% COMPARISON:  10/02/2010 CT abdomen and pelvis. FINDINGS: Lower chest: Platelike atelectasis in the lung bases. Moderate coronary artery calcification. Hepatobiliary: Small lucencies are present throughout the liver with fluid attenuation greatest in liver segment 7 measuring up to 20 mm compatible with cysts. Sequelae of prior granulomatous disease. Mild intra and extrahepatic biliary ductal dilatation is probably compensatory post cholecystectomy. Small volume of ascites posterior and inferior to the right lobe of liver. Pancreas: Unremarkable. No pancreatic ductal dilatation or surrounding inflammatory changes. Spleen: Spleen measures 15.2 x 6.5 x 13.8 cm (volume = 710 cm^3). Sequelae of prior granulomatous disease. Adrenals/Urinary Tract: Adrenal glands are unremarkable. Multiple small renal cysts. Kidneys are otherwise normal, without renal calculi, focal lesion, or hydronephrosis. Bladder is unremarkable. Stomach/Bowel: Stomach is within  normal limits. Appendix appears normal. No evidence of bowel wall thickening, distention, or inflammatory changes. Mild sigmoid diverticulosis. No findings of acute diverticulitis. Vascular/Lymphatic: Aortic atherosclerosis. No enlarged abdominal or pelvic lymph nodes. Reproductive: Status post hysterectomy. No adnexal masses. Other: No abdominal wall hernia or abnormality. No abdominopelvic ascites. Musculoskeletal: Mild spondylosis of visible thoracolumbar spine. Mild bilateral hip osteoarthrosis. No acute fracture. IMPRESSION: 1. Small volume of ascites posterior to right lobe of liver, possibly related to  hepatitis. 2. Splenomegaly, volume 710 cc. 3. Sequelae of granulomatous disease in liver and spleen. 4. Mild sigmoid diverticulosis, no findings of acute diverticulitis. 5. Aortic and coronary artery calcific atherosclerosis. Electronically Signed   By: Kristine Garbe M.D.   On: 11/07/2017 05:38     ____________________________________________   PROCEDURES  Critical Care performed: No   Procedure(s) performed:   Procedures   ____________________________________________   INITIAL IMPRESSION / ASSESSMENT AND PLAN / ED COURSE  As part of my medical decision making, I reviewed the following data within the [place] notes reviewed and incorporated, Labs reviewed , Old chart reviewed and A phone consult was requested and obtained from this/these consultant(s) (Dr. Alice Reichert with GI).    Differential diagnosis includes, but is not limited to, ovarian cyst, ovarian torsion, acute appendicitis, diverticulitis, urinary tract infection/pyelonephritis, endometriosis, bowel obstruction, colitis, renal colic, gastroenteritis, hernia, hepatic stone now in [place], etc. based on the degree of tenderness she is experiencing I am proceeding with a CT scan of the abdomen and pelvis with IV contrast.  She has a very minimal leukocytosis of 11.5 and otherwise her lab work is  within normal limits including a normal CMP, lipase, and troponin.  The presentation is not consistent with ACS nor PE.  I will reassess after getting back in the CT scan results.  Although she still feels some persistent pain she has not a need of analgesia at this time.    Clinical Course as of Nov 07 708  Mon Nov 07, 2017  0619 CT scan indicates a small volume of ascites posterior to the liver, and they suggest the possibility of it being due to hepatitis.  The patient also has splenomegaly with a volume of 710 cc.  There is no other acute or emergent medical condition identified on the CT scan of the abdomen and pelvis.  [CF]  0621 I have added on an acute hepatitis panel to her existing lab work and I have called Dr. Alice Reichert who is currently on-call for GI to discuss the situation and see if a plan of outpatient follow-up with GI is appropriate.  [CF]  [phone] Spoke with phone with Dr. Alice Reichert regarding the patient's CT scan results.  He agreed that there is no indication for admission at this time or additional emergency department workup.  I explained that she is hemodynamically stable and her pain is well controlled in spite of not getting any analgesia in the ED.  We went over her lab results and her CT scan results.  He agreed that outpatient follow-up was appropriate and is willing to see her in the clinic if she is not already established with a gastroenterologist.I discussed the results with the patient and her husband and she told me that actually she has been seeing Dr. Vira Agar for many years and she is scheduling an appointment to see him for a colonoscopy within the next 1-2 weeks.  I told her to take her discharge paperwork with her to the clinic visit and to let them know about the results of our workup today.  Acute hepatitis panel is pending and they should be able to access the results through the computer.  I gave my usual and customary return precautions.  She understands and agrees with  the plan.  I encourage the use of over-the-counter pain medication as needed.  [CF]    Clinical Course User Index [CF] Hinda Kehr, MD    ____________________________________________  FINAL CLINICAL IMPRESSION(S) / ED  DIAGNOSES  Final diagnoses:  RUQ abdominal pain  Splenomegaly  Other ascites     MEDICATIONS GIVEN DURING THIS VISIT:  Medications  iopamidol (ISOVUE-300) 61 % injection 100 mL (100 mLs Intravenous Contrast Given 11/07/17 0509)     ED Discharge Orders    None       Note:  This document was prepared using Dragon voice recognition software and may include unintentional dictation errors.    Hinda Kehr, MD 11/07/17 [phone]

## 2017-11-07 NOTE — ED Notes (Signed)
Patient transported to CT 

## 2017-11-08 LAB — HEPATITIS PANEL, ACUTE
HCV Ab: <0.1 s/co ratio (ref 0.0–0.9)
HEP B C IGM: NEGATIVE
Hep A IgM: NEGATIVE
Hepatitis B Surface Ag: NEGATIVE

## 2017-11-09 DIAGNOSIS — R161 Splenomegaly, not elsewhere classified: Secondary | ICD-10-CM | POA: Insufficient documentation

## 2017-11-09 DIAGNOSIS — M549 Dorsalgia, unspecified: Secondary | ICD-10-CM | POA: Insufficient documentation

## 2017-11-25 ENCOUNTER — Other Ambulatory Visit: Payer: Self-pay | Admitting: Internal Medicine

## 2017-11-25 DIAGNOSIS — R109 Unspecified abdominal pain: Secondary | ICD-10-CM

## 2017-11-30 ENCOUNTER — Ambulatory Visit
Admission: RE | Admit: 2017-11-30 | Discharge: 2017-11-30 | Disposition: A | Discharge status: Home / Self Care | Payer: Medicare Other | Source: Ambulatory Visit | Attending: Internal Medicine | Admitting: Internal Medicine

## 2017-11-30 DIAGNOSIS — R109 Unspecified abdominal pain: Secondary | ICD-10-CM | POA: Insufficient documentation

## 2017-11-30 DIAGNOSIS — R319 Hematuria, unspecified: Secondary | ICD-10-CM | POA: Diagnosis not present

## 2018-02-01 ENCOUNTER — Other Ambulatory Visit: Payer: Self-pay | Admitting: Neurology

## 2018-02-01 DIAGNOSIS — M5489 Other dorsalgia: Secondary | ICD-10-CM

## 2018-02-01 DIAGNOSIS — Z8781 Personal history of (healed) traumatic fracture: Secondary | ICD-10-CM | POA: Insufficient documentation

## 2018-02-01 DIAGNOSIS — R413 Other amnesia: Secondary | ICD-10-CM | POA: Insufficient documentation

## 2018-02-01 DIAGNOSIS — M549 Dorsalgia, unspecified: Secondary | ICD-10-CM

## 2018-02-03 ENCOUNTER — Ambulatory Visit
Admission: RE | Admit: 2018-02-03 | Discharge: 2018-02-03 | Disposition: A | Discharge status: Home / Self Care | Payer: Medicare Other | Source: Ambulatory Visit | Attending: Neurology | Admitting: Neurology

## 2018-02-03 DIAGNOSIS — Z8781 Personal history of (healed) traumatic fracture: Secondary | ICD-10-CM | POA: Diagnosis not present

## 2018-02-03 DIAGNOSIS — M5124 Other intervertebral disc displacement, thoracic region: Secondary | ICD-10-CM | POA: Insufficient documentation

## 2018-02-03 DIAGNOSIS — M549 Dorsalgia, unspecified: Secondary | ICD-10-CM | POA: Insufficient documentation

## 2018-02-03 DIAGNOSIS — M5489 Other dorsalgia: Secondary | ICD-10-CM

## 2018-02-03 DIAGNOSIS — R9389 Abnormal findings on diagnostic imaging of other specified body structures: Secondary | ICD-10-CM | POA: Diagnosis not present

## 2018-02-28 DIAGNOSIS — Z8739 Personal history of other diseases of the musculoskeletal system and connective tissue: Secondary | ICD-10-CM | POA: Insufficient documentation

## 2018-07-05 IMAGING — CR DG CHEST 2V
1 series · 2 of 2 positions shown · non-contrast
Comparison: 02/12/2015.

CLINICAL DATA: Fall.

EXAM:
CHEST  2 VIEW

[Series 1: x chest ap · 0.14mm/px · 2 of 2 slices shown]
[im 1/2]
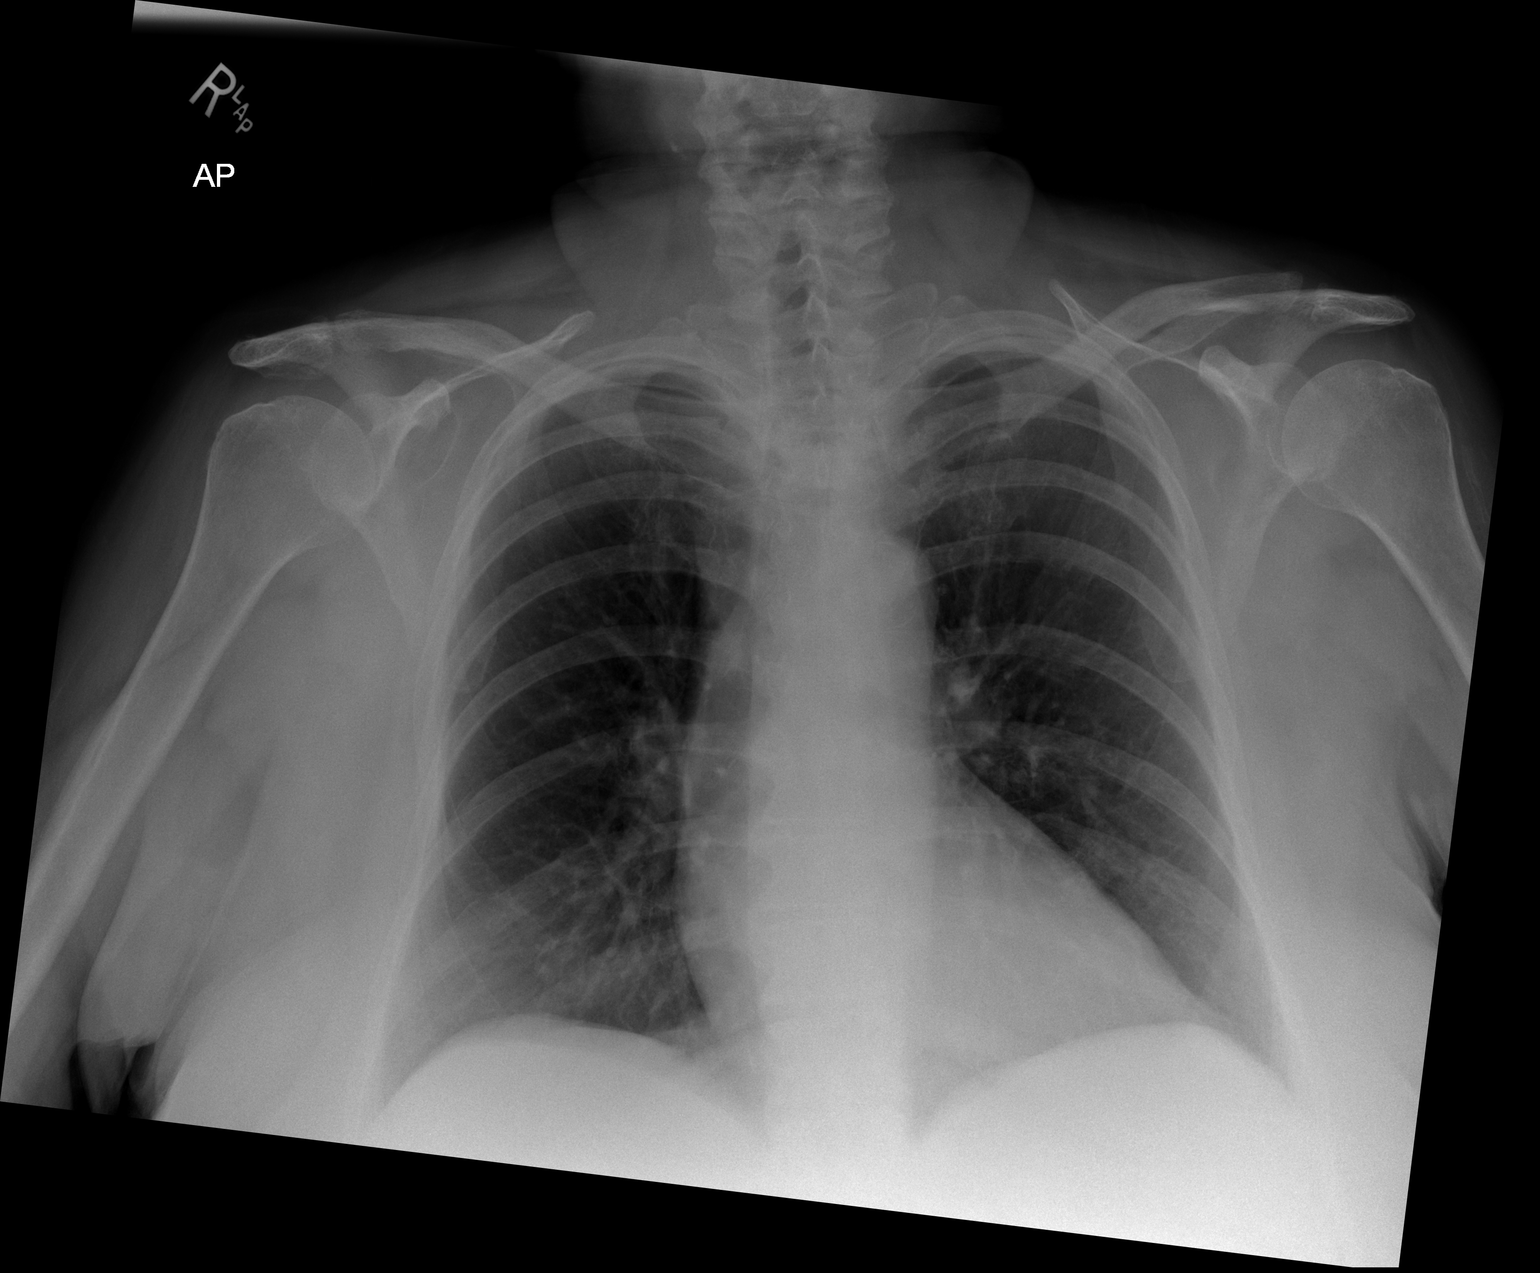
[im 2/2]
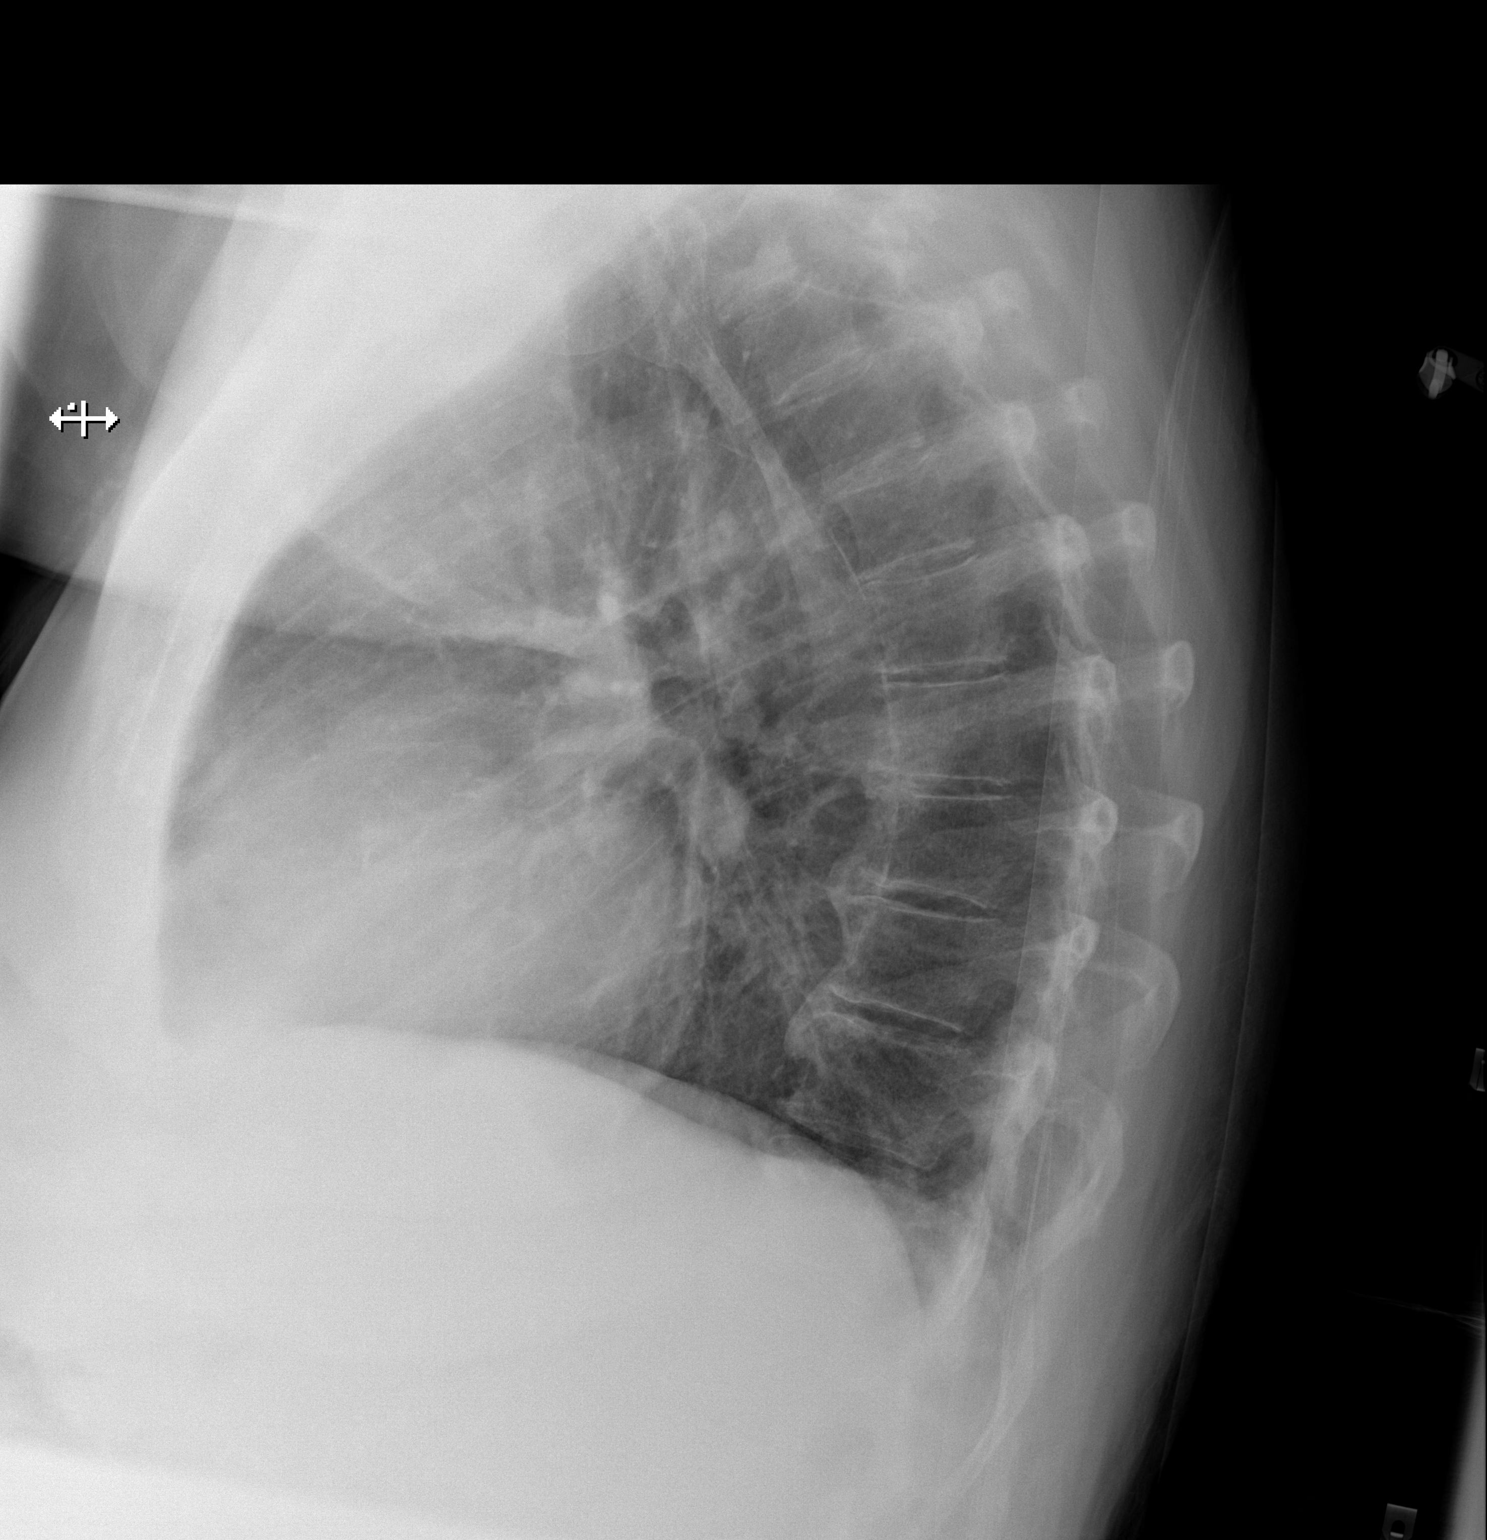

[2 of 2 positions shown; findings below may reference images not displayed]

FINDINGS: Mediastinum and hilar structures normal. Lungs are clear. No pleural
effusion or pneumothorax. Degenerative changes thoracic spine.
Bilateral carotid vascular calcification.
IMPRESSION: 1.  No acute cardiopulmonary disease.  No acute bony abnormality.

2. Bilateral carotid vascular disease .

## 2018-07-21 DIAGNOSIS — E1142 Type 2 diabetes mellitus with diabetic polyneuropathy: Secondary | ICD-10-CM | POA: Insufficient documentation

## 2018-11-16 DIAGNOSIS — Z794 Long term (current) use of insulin: Secondary | ICD-10-CM | POA: Insufficient documentation

## 2018-11-16 DIAGNOSIS — E782 Mixed hyperlipidemia: Secondary | ICD-10-CM | POA: Insufficient documentation

## 2018-11-16 DIAGNOSIS — Z780 Asymptomatic menopausal state: Secondary | ICD-10-CM | POA: Insufficient documentation

## 2019-03-06 ENCOUNTER — Other Ambulatory Visit: Payer: Self-pay | Admitting: Internal Medicine

## 2019-03-06 DIAGNOSIS — Z1231 Encounter for screening mammogram for malignant neoplasm of breast: Secondary | ICD-10-CM

## 2019-03-19 ENCOUNTER — Ambulatory Visit
Admission: RE | Admit: 2019-03-19 | Discharge: 2019-03-19 | Disposition: A | Discharge status: Home / Self Care | Payer: Medicare Other | Source: Ambulatory Visit | Attending: Internal Medicine | Admitting: Internal Medicine

## 2019-03-19 ENCOUNTER — Other Ambulatory Visit: Payer: Self-pay

## 2019-03-19 DIAGNOSIS — Z1231 Encounter for screening mammogram for malignant neoplasm of breast: Secondary | ICD-10-CM | POA: Insufficient documentation

## 2019-11-15 ENCOUNTER — Other Ambulatory Visit: Payer: Self-pay | Admitting: Gastroenterology

## 2019-11-15 DIAGNOSIS — R634 Abnormal weight loss: Secondary | ICD-10-CM

## 2019-11-27 ENCOUNTER — Other Ambulatory Visit: Payer: Self-pay

## 2019-11-27 ENCOUNTER — Ambulatory Visit
Admission: RE | Admit: 2019-11-27 | Discharge: 2019-11-27 | Disposition: A | Discharge status: Home / Self Care | Payer: Medicare Other | Source: Ambulatory Visit | Attending: Gastroenterology | Admitting: Gastroenterology

## 2019-11-27 DIAGNOSIS — R911 Solitary pulmonary nodule: Secondary | ICD-10-CM | POA: Diagnosis not present

## 2019-11-27 DIAGNOSIS — I2510 Atherosclerotic heart disease of native coronary artery without angina pectoris: Secondary | ICD-10-CM | POA: Insufficient documentation

## 2019-11-27 DIAGNOSIS — R634 Abnormal weight loss: Secondary | ICD-10-CM | POA: Diagnosis not present

## 2019-11-27 DIAGNOSIS — I700 Atherosclerosis of aorta: Secondary | ICD-10-CM | POA: Diagnosis not present

## 2019-11-27 DIAGNOSIS — R161 Splenomegaly, not elsewhere classified: Secondary | ICD-10-CM | POA: Diagnosis not present

## 2019-11-27 DIAGNOSIS — R933 Abnormal findings on diagnostic imaging of other parts of digestive tract: Secondary | ICD-10-CM | POA: Insufficient documentation

## 2019-11-27 LAB — POCT I-STAT CREATININE: Creatinine, Ser: 0.9 mg/dL (ref 0.44–1.00)

## 2019-11-27 MED ORDER — IOHEXOL 300 MG/ML  SOLN
100.0000 mL | Freq: Once | INTRAMUSCULAR | Status: AC | PRN
  Administered 2019-11-27: 100 mL via INTRAVENOUS

## 2019-12-04 ENCOUNTER — Ambulatory Visit: Payer: Self-pay | Admitting: Surgery

## 2019-12-04 MED ORDER — CLINDAMYCIN PHOSPHATE 900 MG/50ML IV SOLN
900.0000 mg | INTRAVENOUS | Status: AC
  Filled 2019-12-04: qty 50

## 2019-12-04 MED ORDER — GENTAMICIN SULFATE 40 MG/ML IJ SOLN
5.0000 mg/kg | INTRAVENOUS | Status: AC
  Filled 2019-12-04: qty 7.75

## 2019-12-04 NOTE — H&P (Signed)
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 female who was referred by Fabian Sharp* for evaluation of above. Hx of wt loss, and episodic diarrhea past several months.  Has been better now, but still persists.  Multiple social factors contributing, but workup for it showed possible appendiceal mass on CT so here today to discuss possible surgical excision.    Past Medical History:  has a past medical history of Acid reflux, Anxiety, Arthritis, B12 deficiency, Bronchitis, Chronic back pain, Chronic vaginitis, Depression, Diabetes mellitus type 2, uncomplicated (CMS-HCC) ([place]), FH: colon cancer (05/10/2014), Fibrocystic breast disease, Herpes zoster (03/2004), History of bone density study (12/23/2010), History of colonic polyps, Hyperlipidemia, Hypertension, Mid back pain on right side (11/09/2017), Migraine headache, Non-cardiac chest pain, PSVT (paroxysmal supraventricular tachycardia) (CMS-HCC), Sleep apnea, Splenomegaly (11/09/2017), Stroke (CMS-HCC) (2008), SUI (stress urinary incontinence, female), and Vertigo.  Past Surgical History:  has a past surgical history that includes Hysterectomy; Abdominoplasty; Cholecystectomy; benign breast biopsy; extraction cataract extracapsular w/insertion intraocular prosthesis (Right, 11/19/2013); Eye surgery; extraction cataract extracapsular w/insertion intraocular prosthesis (Left, 12/03/2013); Cholecystectomy; Colonoscopy (08/06/1997); Colonoscopy (10/14/1999); Colonoscopy (02/03/2004); Colonoscopy (02/21/2007); Colonoscopy (05/24/2011); egd (02/03/2004); egd (02/21/2007); Colonoscopy (06/24/2014); and egd (06/24/2014).  Family History: family history includes Alcohol abuse in her mother; Colon cancer in her father; Melanoma in her daughter; Myocardial Infarction (Heart attack) in her father; No Known Problems in her daughter, maternal grandfather, maternal grandmother, paternal grandfather, paternal  grandmother, and son; Pneumonia in her sister; Uterine cancer in her sister.  Social History:  reports that she has never smoked. She has never used smokeless tobacco. She reports previous alcohol use. She reports that she does not use drugs.  Current Medications: has a current medication list which includes the following prescription(s): aspirin, atorvastatin, cholecalciferol, clopidogrel, duloxetine, lancing device with lancets, lantus solostar u-100 insulin, metoprolol succinate, onetouch ultra blue test strip, pantoprazole, pen needle, diabetic, trazodone, ulticare pen needle, unifine pentips, and dulaglutide.  Allergies:      Allergies  Allergen Reactions  . Amoxicillin Itching  . Lidocaine Itching  . Penicillin G Itching    ROS:  A 15 point review of systems was performed and pertinent positives and negatives noted in HPI   Objective:   BP 118/67   Ht 162.6 cm (5\' 4" )   Wt 72.1 kg (159 lb)   BMI 27.29 kg/m   Constitutional :  alert, appears stated age, cooperative and no distress  Lymphatics/Throat:  no asymmetry, masses, or scars  Respiratory:  clear to auscultation bilaterally  Cardiovascular:  regular rate and rhythm  Gastrointestinal: soft, non-tender; bowel sounds normal; no masses,  no organomegaly.    Musculoskeletal: Steady gait and movement  Skin: Cool and moist  Psychiatric: Normal affect, non-agitated, not confused       LABS:  n/a   RADS: CLINICAL DATA: 50 pound weight loss. Anorexia.  EXAM: CT CHEST, ABDOMEN, AND PELVIS WITH CONTRAST  TECHNIQUE: Multidetector CT imaging of the chest, abdomen and pelvis was performed following the standard protocol during bolus administration of intravenous contrast.  CONTRAST: 156mL OMNIPAQUE IOHEXOL 300 MG/ML SOLN  COMPARISON: CT AP 11/07/2017  FINDINGS: CT CHEST FINDINGS  Cardiovascular: The heart size appears within normal limits. No pericardial effusion. Aortic atherosclerosis. Lad, left  circumflex and RCA coronary artery calcifications.  Mediastinum/Nodes: Normal appearance of the thyroid gland. The trachea appears patent and is midline. Normal appearance of the esophagus. At the T1 level there is a paravertebral no enlarged  mediastinal or hilar lymph nodes. Ovoid soft tissue nodule measuring 2.3 x 1.7 cm, image 8/2. On cervical spine CT from 04/25/2017 this was present measuring 1.8 x 1.3 cm. 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  Lungs/Pleura: No pleural effusion. No airspace consolidation, atelectasis, or pneumothorax. 2 mm right middle lobe perifissural nodule is identified, image 85/3.  Musculoskeletal: Spondylosis identified within the thoracic spine. No aggressive lytic or sclerotic bone lesions.  CT ABDOMEN PELVIS FINDINGS  Hepatobiliary: There is mild diffuse hepatic steatosis. There is a macrolobular contour the liver with hypertrophy of the lateral segment of left lobe of liver. Squaring of the caudate lobe is also noted. The several simple appearing cysts are identified. Previous cholecystectomy. Common bile duct is increased in caliber measuring up to 1 cm. No choledocholithiasis or mass identified.  Pancreas: Unremarkable. No pancreatic ductal dilatation or surrounding inflammatory changes.  Spleen: The spleen measures 14.0 by 11.7 by 6.0 cm (volume = 510 cm^3) calcified granulomas identified within the spleen.  Adrenals/Urinary Tract: Adrenal glands are unremarkable. Mild bilateral renal volume loss scarring. No mass or hydronephrosis. Small bilateral kidney cysts. Bladder is unremarkable.  Stomach/Bowel: Mid the appendix is abnormally thickened with enhancement. No surrounding inflammatory changes identified. The appendix measures 1.2 cm, image 65/4. This is a new finding when compared with previous exam. The primary differential considerations include  appendiceal neoplasm versus appendicitis.  Vascular/Lymphatic: Aortic atherosclerosis. Small upper abdominal lymph nodes identified without adenopathy. No pelvic or inguinal adenopathy.  Reproductive: Status post hysterectomy. No adnexal masses.  Other: No free fluid or fluid collections.  Musculoskeletal: No acute or significant osseous findings.  IMPRESSION: 1. Abnormal thickening and enhancement of the mid appendix is identified. The primary differential considerations include appendiceal neoplasm versus appendicitis. Careful clinical correlation is advised. 2. 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 3. Splenomegaly. 4. Multi vessel coronary artery atherosclerotic calcifications. 5. 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 This was present on MRI of the thoracic spine from 02/03/2018 and is favored to represent a benign abnormality. 6. 2 mm right middle lobe lung nodule. No follow-up needed if patient is low-risk. Non-contrast chest CT can be considered in 12 months if patient is high-risk. This recommendation follows the consensus statement: Guidelines for Management of Incidental Pulmonary Nodules Detected on CT Images: From the Fleischner Society 2017; Radiology 2017; 284:228-243. 7. Coronary artery calcifications.  Aortic Atherosclerosis (ICD10-I70.0).  These results will be called to the ordering clinician or representative by the Radiologist Assistant, and communication documented in the PACS or [employer].   Electronically Signed  By: Kerby Moors M.D.  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  No further dx tests available to further assess exact pathology.  Plan:   1. 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  Alternatives include continued observation.   Benefits include possible symptom relief, pathologic evaluation,   Discussed the risk of surgery including post-op infxn, bleeding, damage to surrounding organs and possible re-operation to address said risks. The risks of general anesthetic, if used, includes MI, CVA, sudden death or even reaction to anesthetic medications also discussed.  Typical post-op recovery time were also discussed.  The patient verbalized understanding and all questions were answered to the patient's satisfaction.  2. Pt  will discuss further with son before comitting to date.  If she wishes to proceed, will need to hold plavix.  Plan for robotic assisted laparoscopic appendectomy

## 2019-12-12 ENCOUNTER — Inpatient Hospital Stay: Admission: RE | Admit: 2019-12-12 | Payer: Medicare Other | Source: Ambulatory Visit

## 2019-12-14 ENCOUNTER — Other Ambulatory Visit: Payer: Self-pay

## 2019-12-14 ENCOUNTER — Encounter
Admission: RE | Admit: 2019-12-14 | Discharge: 2019-12-14 | Disposition: A | Discharge status: Home / Self Care | Payer: Medicare Other | Source: Ambulatory Visit | Attending: Surgery | Admitting: Surgery

## 2019-12-14 DIAGNOSIS — I10 Essential (primary) hypertension: Secondary | ICD-10-CM | POA: Insufficient documentation

## 2019-12-14 DIAGNOSIS — Z01818 Encounter for other preprocedural examination: Secondary | ICD-10-CM | POA: Insufficient documentation

## 2019-12-14 HISTORY — DX: Unspecified osteoarthritis, unspecified site: M19.90

## 2019-12-14 NOTE — Patient Instructions (Signed)
Your procedure is scheduled on: Friday December 20, 2021 Report to Day Surgery inside [facility]. To find out your arrival time please call [phone] between 1PM - 3PM on Thursday December 20, 2019.  Remember: Instructions that are not followed completely may result in serious medical risk,  up to and including death, or upon the discretion of your surgeon and anesthesiologist your  surgery may need to be rescheduled.     _X__ 1. Do not eat food after midnight the night before your procedure.                 No gum chewing or hard candies. You may drink clear liquids up to 2 hours                 before you are scheduled to arrive for your surgery- DO not drink clear                 liquids within 2 hours of the start of your surgery.                 Clear Liquids include:  water, apple juice without pulp, clear Gatorade, G2 or                  Gatorade Zero (avoid Red/Purple/Blue), Black Coffee or Tea (Do not add                 anything to coffee or tea).  __X__2.  On the morning of surgery brush your teeth with toothpaste and water, you                may rinse your mouth with mouthwash if you wish.  Do not swallow any toothpaste of mouthwash.     _X__ 3.  No Alcohol for 24 hours before or after surgery.   _X__ 4.  Do Not Smoke or use e-cigarettes For 24 Hours Prior to Your Surgery.                 Do not use any chewable tobacco products for at least 6 hours prior to                 surgery.  __X__ 5.  Notify your doctor if there is any change in your medical condition      (cold, fever, infections).     Do not wear jewelry, make-up, hairpins, clips or nail polish. Do not wear lotions, powders, or perfumes. You may wear deodorant. Do not shave 48 hours prior to surgery. Men may shave face and neck. Do not bring valuables to the hospital.    [facility] is not responsible for any belongings or valuables.  Contacts, dentures or bridgework may not be worn  into surgery. Leave your suitcase in the car. After surgery it may be brought to your room. For patients admitted to the hospital, discharge time is determined by your treatment team.   Patients discharged the day of surgery will not be allowed to drive home.   Make arrangements for someone to be with you for the first 24 hours of your Same Day Discharge.    __X__ Take these medicines the morning of surgery with A SIP OF WATER:    1. DULoxetine (CYMBALTA) 60 MG  2. pantoprazole (PROTONIX) 40 MG    __X__ Use CHG wipes as directed   __X__ Take 1/2 of usual insulin dose the night before surgery. No insulin the morning  of surgery.   __X__ Stop Plavix and aspirin as instructed by provider   __X__ Stop Anti-inflammatories such as ibuprofen, Aleve, naproxen, and or BC powders.   __X__ Stop supplements until after surgery.    __X__ Do not start any herbal supplements before your surgery.

## 2019-12-19 ENCOUNTER — Encounter
Admission: RE | Admit: 2019-12-19 | Discharge: 2019-12-19 | Disposition: A | Discharge status: Home / Self Care | Payer: Medicare Other | Source: Ambulatory Visit | Attending: Surgery | Admitting: Surgery

## 2019-12-19 ENCOUNTER — Encounter (HOSPITAL_COMMUNITY): Payer: Self-pay | Admitting: Anesthesiology

## 2019-12-19 ENCOUNTER — Other Ambulatory Visit: Payer: Self-pay

## 2019-12-19 DIAGNOSIS — Z01818 Encounter for other preprocedural examination: Secondary | ICD-10-CM | POA: Diagnosis not present

## 2019-12-19 DIAGNOSIS — Z20822 Contact with and (suspected) exposure to covid-19: Secondary | ICD-10-CM | POA: Diagnosis not present

## 2019-12-19 DIAGNOSIS — I10 Essential (primary) hypertension: Secondary | ICD-10-CM | POA: Diagnosis not present

## 2019-12-19 LAB — CBC
HCT: 46.1 % — ABNORMAL HIGH (ref 36.0–46.0)
Hemoglobin: 14.2 g/dL (ref 12.0–15.0)
MCH: 25 pg — ABNORMAL LOW (ref 26.0–34.0)
MCHC: 30.8 g/dL (ref 30.0–36.0)
MCV: 81.2 fL (ref 80.0–100.0)
Platelets: 350 10*3/uL (ref 150–400)
RBC: 5.68 MIL/uL — ABNORMAL HIGH (ref 3.87–5.11)
RDW: 15.6 % — ABNORMAL HIGH (ref 11.5–15.5)
WBC: 8.4 10*3/uL (ref 4.0–10.5)
nRBC: 0 % (ref 0.0–0.2)

## 2019-12-19 LAB — SARS CORONAVIRUS 2 (TAT 6-24 HRS): SARS Coronavirus 2: NEGATIVE

## 2019-12-19 NOTE — Pre-Procedure Instructions (Signed)
Secure chat with Dr Kayleen Memos:  Pt having robotic assisted lap appendectomy with sakai on 4-23. Pt has been cleared by PCP but EKG done today shows new LBBB. Pt has been seen by Cardiology last year for htn and cva. Can you review EKG and let me know if we need cardiac clearance? Thanks!   New onset LBBB demands a cardiology clearance. Thank you Su Hilt will send to Cardiology. Thanks Dr Oren Section

## 2019-12-19 NOTE — Pre-Procedure Instructions (Addendum)
Called over to dr sakai's office regarding pt needing cardiac clearance. Spoke with Charlena Cross and informed her of this. Faxed clearance request to dr Brandy Hale office and also dr Saralyn Pilar office with fax confirmation received from both offices. Charlena Cross will let Vicky Dr Ines Bloomer nurse know about the clearance that is needed

## 2019-12-21 ENCOUNTER — Ambulatory Visit: Admission: RE | Admit: 2019-12-21 | Payer: Medicare Other | Source: Home / Self Care | Admitting: Surgery

## 2019-12-21 ENCOUNTER — Encounter: Admission: RE | Payer: Self-pay | Source: Home / Self Care

## 2019-12-21 DIAGNOSIS — Z01810 Encounter for preprocedural cardiovascular examination: Secondary | ICD-10-CM | POA: Insufficient documentation

## 2019-12-21 DIAGNOSIS — I447 Left bundle-branch block, unspecified: Secondary | ICD-10-CM | POA: Insufficient documentation

## 2019-12-21 SURGERY — APPENDECTOMY, ROBOT-ASSISTED, LAPAROSCOPIC
Anesthesia: General | Site: Abdomen

## 2020-01-01 ENCOUNTER — Other Ambulatory Visit: Payer: Self-pay | Admitting: Internal Medicine

## 2020-01-01 DIAGNOSIS — Z1231 Encounter for screening mammogram for malignant neoplasm of breast: Secondary | ICD-10-CM

## 2020-01-09 ENCOUNTER — Other Ambulatory Visit: Payer: Self-pay

## 2020-01-09 ENCOUNTER — Other Ambulatory Visit
Admission: RE | Admit: 2020-01-09 | Discharge: 2020-01-09 | Disposition: A | Discharge status: Home / Self Care | Payer: Medicare Other | Source: Ambulatory Visit | Attending: Surgery | Admitting: Surgery

## 2020-01-09 DIAGNOSIS — Z01812 Encounter for preprocedural laboratory examination: Secondary | ICD-10-CM | POA: Diagnosis present

## 2020-01-09 DIAGNOSIS — Z20822 Contact with and (suspected) exposure to covid-19: Secondary | ICD-10-CM | POA: Insufficient documentation

## 2020-01-09 LAB — SARS CORONAVIRUS 2 (TAT 6-24 HRS): SARS Coronavirus 2: NEGATIVE

## 2020-01-09 NOTE — Pre-Procedure Instructions (Signed)
Cardiac and medical clearance on chart. 

## 2020-01-11 ENCOUNTER — Ambulatory Visit
Admission: RE | Admit: 2020-01-11 | Discharge: 2020-01-11 | Disposition: A | Discharge status: Home / Self Care | Payer: Medicare Other | Attending: Surgery | Admitting: Surgery

## 2020-01-11 ENCOUNTER — Ambulatory Visit: Payer: Self-pay | Admitting: Surgery

## 2020-01-11 ENCOUNTER — Encounter
Admission: RE | Disposition: A | Discharge status: Home / Self Care | Payer: Self-pay | Source: Home / Self Care | Attending: Surgery

## 2020-01-11 ENCOUNTER — Ambulatory Visit: Payer: Medicare Other | Admitting: Anesthesiology

## 2020-01-11 ENCOUNTER — Other Ambulatory Visit: Payer: Self-pay

## 2020-01-11 ENCOUNTER — Encounter: Payer: Self-pay | Admitting: Surgery

## 2020-01-11 DIAGNOSIS — F329 Major depressive disorder, single episode, unspecified: Secondary | ICD-10-CM | POA: Diagnosis not present

## 2020-01-11 DIAGNOSIS — D490 Neoplasm of unspecified behavior of digestive system: Secondary | ICD-10-CM | POA: Diagnosis present

## 2020-01-11 DIAGNOSIS — Z79899 Other long term (current) drug therapy: Secondary | ICD-10-CM | POA: Insufficient documentation

## 2020-01-11 DIAGNOSIS — F419 Anxiety disorder, unspecified: Secondary | ICD-10-CM | POA: Insufficient documentation

## 2020-01-11 DIAGNOSIS — Z8673 Personal history of transient ischemic attack (TIA), and cerebral infarction without residual deficits: Secondary | ICD-10-CM | POA: Insufficient documentation

## 2020-01-11 DIAGNOSIS — I10 Essential (primary) hypertension: Secondary | ICD-10-CM | POA: Insufficient documentation

## 2020-01-11 DIAGNOSIS — Z7982 Long term (current) use of aspirin: Secondary | ICD-10-CM | POA: Insufficient documentation

## 2020-01-11 DIAGNOSIS — K388 Other specified diseases of appendix: Secondary | ICD-10-CM | POA: Insufficient documentation

## 2020-01-11 DIAGNOSIS — K219 Gastro-esophageal reflux disease without esophagitis: Secondary | ICD-10-CM | POA: Insufficient documentation

## 2020-01-11 DIAGNOSIS — G4730 Sleep apnea, unspecified: Secondary | ICD-10-CM | POA: Insufficient documentation

## 2020-01-11 DIAGNOSIS — E119 Type 2 diabetes mellitus without complications: Secondary | ICD-10-CM | POA: Insufficient documentation

## 2020-01-11 DIAGNOSIS — Z794 Long term (current) use of insulin: Secondary | ICD-10-CM | POA: Diagnosis not present

## 2020-01-11 DIAGNOSIS — E785 Hyperlipidemia, unspecified: Secondary | ICD-10-CM | POA: Diagnosis not present

## 2020-01-11 HISTORY — PX: XI ROBOTIC LAPAROSCOPIC ASSISTED APPENDECTOMY: SHX6877

## 2020-01-11 LAB — GLUCOSE, CAPILLARY
Glucose-Capillary: 136 mg/dL — ABNORMAL HIGH (ref 70–99)
Glucose-Capillary: 157 mg/dL — ABNORMAL HIGH (ref 70–99)

## 2020-01-11 SURGERY — APPENDECTOMY, ROBOT-ASSISTED, LAPAROSCOPIC
Anesthesia: General | Site: Abdomen

## 2020-01-11 MED ORDER — SUGAMMADEX SODIUM 200 MG/2ML IV SOLN
INTRAVENOUS | Status: DC | PRN
  Administered 2020-01-11: 200 mg via INTRAVENOUS

## 2020-01-11 MED ORDER — PROPOFOL 500 MG/50ML IV EMUL
INTRAVENOUS | Status: AC
  Filled 2020-01-11: qty 50

## 2020-01-11 MED ORDER — FENTANYL CITRATE (PF) 100 MCG/2ML IJ SOLN
INTRAMUSCULAR | Status: DC | PRN
  Administered 2020-01-11 (×2): 50 ug via INTRAVENOUS

## 2020-01-11 MED ORDER — ACETAMINOPHEN 325 MG PO TABS
650.0000 mg | ORAL_TABLET | Freq: Three times a day (TID) | ORAL | 0 refills | Status: AC | PRN
Start: 2020-01-11 — End: 2020-02-10

## 2020-01-11 MED ORDER — GENTAMICIN SULFATE 40 MG/ML IJ SOLN
5.0000 mg/kg | INTRAVENOUS | Status: AC
  Administered 2020-01-11: 310 mg via INTRAVENOUS
  Filled 2020-01-11: qty 7.75

## 2020-01-11 MED ORDER — CHLORHEXIDINE GLUCONATE CLOTH 2 % EX PADS: 6.0000 | MEDICATED_PAD | Freq: Once | CUTANEOUS | Status: AC

## 2020-01-11 MED ORDER — ROCURONIUM BROMIDE 100 MG/10ML IV SOLN
INTRAVENOUS | Status: DC | PRN
  Administered 2020-01-11: 5 mg via INTRAVENOUS
  Administered 2020-01-11: 50 mg via INTRAVENOUS

## 2020-01-11 MED ORDER — IBUPROFEN 800 MG PO TABS
800.0000 mg | ORAL_TABLET | Freq: Once | ORAL | Status: AC
  Administered 2020-01-11: 800 mg via ORAL

## 2020-01-11 MED ORDER — IBUPROFEN 800 MG PO TABS
800.0000 mg | ORAL_TABLET | Freq: Three times a day (TID) | ORAL | 0 refills | Status: DC | PRN
Start: 2020-01-11 — End: 2020-06-01

## 2020-01-11 MED ORDER — DOCUSATE SODIUM 100 MG PO CAPS
100.0000 mg | ORAL_CAPSULE | Freq: Two times a day (BID) | ORAL | 0 refills | Status: AC | PRN
Start: 2020-01-11 — End: 2020-01-21

## 2020-01-11 MED ORDER — CLINDAMYCIN PHOSPHATE 900 MG/50ML IV SOLN
900.0000 mg | INTRAVENOUS | Status: AC
  Filled 2020-01-11: qty 50

## 2020-01-11 MED ORDER — PROPOFOL 10 MG/ML IV BOLUS
INTRAVENOUS | Status: DC | PRN
  Administered 2020-01-11: 150 mg via INTRAVENOUS

## 2020-01-11 MED ORDER — LIDOCAINE HCL (PF) 1 % IJ SOLN
INTRAMUSCULAR | Status: AC
  Filled 2020-01-11: qty 30

## 2020-01-11 MED ORDER — FENTANYL CITRATE (PF) 100 MCG/2ML IJ SOLN
INTRAMUSCULAR | Status: AC
  Administered 2020-01-11: 25 ug via INTRAVENOUS
  Filled 2020-01-11: qty 2

## 2020-01-11 MED ORDER — FENTANYL CITRATE (PF) 100 MCG/2ML IJ SOLN
25.0000 ug | INTRAMUSCULAR | Status: AC | PRN
  Administered 2020-01-11 (×4): 25 ug via INTRAVENOUS

## 2020-01-11 MED ORDER — SODIUM CHLORIDE 0.9 % IV SOLN: INTRAVENOUS | Status: DC

## 2020-01-11 MED ORDER — ACETAMINOPHEN 325 MG PO TABS
ORAL_TABLET | ORAL | Status: AC
  Administered 2020-01-11: 650 mg via ORAL
  Filled 2020-01-11: qty 2

## 2020-01-11 MED ORDER — ACETAMINOPHEN 325 MG PO TABS: 325.0000 mg | ORAL_TABLET | ORAL | Status: DC | PRN

## 2020-01-11 MED ORDER — IBUPROFEN 800 MG PO TABS
ORAL_TABLET | ORAL | Status: AC
  Filled 2020-01-11: qty 1

## 2020-01-11 MED ORDER — FAMOTIDINE 20 MG PO TABS
20.0000 mg | ORAL_TABLET | Freq: Once | ORAL | Status: AC
  Administered 2020-01-11: 20 mg via ORAL

## 2020-01-11 MED ORDER — HYDROCODONE-ACETAMINOPHEN 7.5-325 MG PO TABS
ORAL_TABLET | ORAL | Status: AC
  Filled 2020-01-11: qty 1

## 2020-01-11 MED ORDER — BUPIVACAINE LIPOSOME 1.3 % IJ SUSP
INTRAMUSCULAR | Status: AC
  Filled 2020-01-11: qty 20

## 2020-01-11 MED ORDER — FAMOTIDINE 20 MG PO TABS
ORAL_TABLET | ORAL | Status: AC
  Filled 2020-01-11: qty 1

## 2020-01-11 MED ORDER — BUPIVACAINE HCL (PF) 0.5 % IJ SOLN
INTRAMUSCULAR | Status: AC
  Filled 2020-01-11: qty 30

## 2020-01-11 MED ORDER — HYDROCODONE-ACETAMINOPHEN 5-325 MG PO TABS
ORAL_TABLET | ORAL | Status: AC
  Filled 2020-01-11: qty 1

## 2020-01-11 MED ORDER — ONDANSETRON HCL 4 MG/2ML IJ SOLN: 4.0000 mg | Freq: Once | INTRAMUSCULAR | Status: DC | PRN

## 2020-01-11 MED ORDER — ONDANSETRON HCL 4 MG/2ML IJ SOLN
INTRAMUSCULAR | Status: AC
  Filled 2020-01-11: qty 2

## 2020-01-11 MED ORDER — ARTIFICIAL TEARS OPHTHALMIC OINT
TOPICAL_OINTMENT | OPHTHALMIC | Status: AC
  Filled 2020-01-11: qty 3.5

## 2020-01-11 MED ORDER — DEXAMETHASONE SODIUM PHOSPHATE 10 MG/ML IJ SOLN
INTRAMUSCULAR | Status: DC | PRN
  Administered 2020-01-11: 5 mg via INTRAVENOUS

## 2020-01-11 MED ORDER — BUPIVACAINE-EPINEPHRINE (PF) 0.25% -1:200000 IJ SOLN
INTRAMUSCULAR | Status: AC
  Filled 2020-01-11: qty 30

## 2020-01-11 MED ORDER — HYDROCODONE-ACETAMINOPHEN 7.5-325 MG PO TABS
1.0000 | ORAL_TABLET | Freq: Once | ORAL | Status: AC | PRN
  Administered 2020-01-11: 1 via ORAL

## 2020-01-11 MED ORDER — ACETAMINOPHEN 160 MG/5ML PO SOLN
325.0000 mg | ORAL | Status: DC | PRN
  Filled 2020-01-11: qty 20.3

## 2020-01-11 MED ORDER — LACTATED RINGERS IV SOLN: INTRAVENOUS | Status: DC | PRN

## 2020-01-11 MED ORDER — FENTANYL CITRATE (PF) 100 MCG/2ML IJ SOLN
INTRAMUSCULAR | Status: AC
  Filled 2020-01-11: qty 2

## 2020-01-11 MED ORDER — DEXMEDETOMIDINE HCL 200 MCG/2ML IV SOLN
INTRAVENOUS | Status: DC | PRN
  Administered 2020-01-11: 16 ug via INTRAVENOUS

## 2020-01-11 MED ORDER — ONDANSETRON HCL 4 MG/2ML IJ SOLN
INTRAMUSCULAR | Status: DC | PRN
  Administered 2020-01-11: 4 mg via INTRAVENOUS

## 2020-01-11 MED ORDER — HYDROCODONE-ACETAMINOPHEN 5-325 MG PO TABS: 1.0000 | ORAL_TABLET | Freq: Four times a day (QID) | ORAL | 0 refills | Status: DC | PRN

## 2020-01-11 MED ORDER — LIDOCAINE-EPINEPHRINE 1 %-1:100000 IJ SOLN
INTRAMUSCULAR | Status: AC
  Filled 2020-01-11: qty 1

## 2020-01-11 SURGICAL SUPPLY — 76 items
ANCHOR TIS RET SYS 235ML (MISCELLANEOUS) ×3 IMPLANT
BAG INFUSER PRESSURE 100CC (MISCELLANEOUS) IMPLANT
BLADE SURG SZ11 CARB STEEL (BLADE) ×3 IMPLANT
CANISTER SUCT 1200ML W/VALVE (MISCELLANEOUS) ×3 IMPLANT
CANNULA REDUC XI 12-8 STAPL (CANNULA) ×1
CANNULA REDUC XI 12-8MM STAPL (CANNULA) ×1
CANNULA REDUCER 12-8 DVNC XI (CANNULA) ×1 IMPLANT
CHLORAPREP W/TINT 26 (MISCELLANEOUS) ×3 IMPLANT
COVER TIP SHEARS 8 DVNC (MISCELLANEOUS) ×1 IMPLANT
COVER TIP SHEARS 8MM DA VINCI (MISCELLANEOUS) ×2
COVER WAND RF STERILE (DRAPES) ×3 IMPLANT
DEFOGGER SCOPE WARMER CLEARIFY (MISCELLANEOUS) ×3 IMPLANT
DERMABOND ADVANCED (GAUZE/BANDAGES/DRESSINGS) ×2
DERMABOND ADVANCED .7 DNX12 (GAUZE/BANDAGES/DRESSINGS) ×1 IMPLANT
DRAPE 3/4 80X56 (DRAPES) ×3 IMPLANT
DRAPE ARM DVNC X/XI (DISPOSABLE) ×4 IMPLANT
DRAPE COLUMN DVNC XI (DISPOSABLE) ×1 IMPLANT
DRAPE DA VINCI XI ARM (DISPOSABLE) ×8
DRAPE DA VINCI XI COLUMN (DISPOSABLE) ×2
ELECT CAUTERY BLADE 6.4 (BLADE) IMPLANT
ELECT REM PT RETURN 9FT ADLT (ELECTROSURGICAL) ×3
ELECTRODE REM PT RTRN 9FT ADLT (ELECTROSURGICAL) ×1 IMPLANT
GLOVE BIOGEL PI IND STRL 7.0 (GLOVE) ×2 IMPLANT
GLOVE BIOGEL PI INDICATOR 7.0 (GLOVE) ×4
GLOVE SURG SYN 6.5 ES PF (GLOVE) ×6 IMPLANT
GOWN STRL REUS W/ TWL LRG LVL3 (GOWN DISPOSABLE) ×3 IMPLANT
GOWN STRL REUS W/TWL LRG LVL3 (GOWN DISPOSABLE) ×6
GRASPER SUT TROCAR 14GX15 (MISCELLANEOUS) IMPLANT
IRRIGATOR SUCT 8 DISP DVNC XI (IRRIGATION / IRRIGATOR) IMPLANT
IRRIGATOR SUCTION 8MM XI DISP (IRRIGATION / IRRIGATOR)
IV NS 1000ML (IV SOLUTION)
IV NS 1000ML BAXH (IV SOLUTION) IMPLANT
KIT PINK PAD W/HEAD ARE REST (MISCELLANEOUS) ×3
KIT PINK PAD W/HEAD ARM REST (MISCELLANEOUS) ×1 IMPLANT
L-HOOK LAP DISP 36CM (ELECTROSURGICAL) ×3
LABEL OR SOLS (LABEL) ×3 IMPLANT
LHOOK LAP DISP 36CM (ELECTROSURGICAL) ×1 IMPLANT
NEEDLE HYPO 22GX1.5 SAFETY (NEEDLE) ×3 IMPLANT
NEEDLE INSUFFLATION 14GA 120MM (NEEDLE) ×3 IMPLANT
OBTURATOR OPTICAL STANDARD 8MM (TROCAR) ×2
OBTURATOR OPTICAL STND 8 DVNC (TROCAR) ×1
OBTURATOR OPTICALSTD 8 DVNC (TROCAR) ×1 IMPLANT
PACK LAP CHOLECYSTECTOMY (MISCELLANEOUS) ×3 IMPLANT
PENCIL ELECTRO HAND CTR (MISCELLANEOUS) ×3 IMPLANT
RELOAD STAPLER 2.5X45 WHT DVNC (STAPLE) IMPLANT
RELOAD STAPLER 2.5X60 WHT DVNC (STAPLE) ×1 IMPLANT
RELOAD STAPLER 3.5X45 BLU DVNC (STAPLE) IMPLANT
RELOAD STAPLER 3.5X60 BLU DVNC (STAPLE) ×1 IMPLANT
SEAL CANN UNIV 5-8 DVNC XI (MISCELLANEOUS) ×3 IMPLANT
SEAL XI 5MM-8MM UNIVERSAL (MISCELLANEOUS) ×6
SET TUBE SMOKE EVAC HIGH FLOW (TUBING) ×3 IMPLANT
SOLUTION ELECTROLUBE (MISCELLANEOUS) ×3 IMPLANT
STAPLER 45 DA VINCI SURE FORM (STAPLE)
STAPLER 45 SUREFORM DVNC (STAPLE) IMPLANT
STAPLER 60 DA VINCI SURE FORM (STAPLE) ×2
STAPLER 60 SUREFORM DVNC (STAPLE) ×1 IMPLANT
STAPLER CANNULA SEAL DVNC XI (STAPLE) ×1 IMPLANT
STAPLER CANNULA SEAL XI (STAPLE) ×2
STAPLER RELOAD 2.5X45 WHITE (STAPLE)
STAPLER RELOAD 2.5X45 WHT DVNC (STAPLE)
STAPLER RELOAD 2.5X60 WHITE (STAPLE) ×2
STAPLER RELOAD 2.5X60 WHT DVNC (STAPLE) ×1
STAPLER RELOAD 3.5X45 BLU DVNC (STAPLE)
STAPLER RELOAD 3.5X45 BLUE (STAPLE)
STAPLER RELOAD 3.5X60 BLU DVNC (STAPLE) ×1
STAPLER RELOAD 3.5X60 BLUE (STAPLE) ×2
SUT MNCRL 4-0 (SUTURE) ×2
SUT MNCRL 4-0 27XMFL (SUTURE) ×1
SUT MNCRL AB 4-0 PS2 18 (SUTURE) ×3 IMPLANT
SUT VIC AB 3-0 SH 27 (SUTURE) ×2
SUT VIC AB 3-0 SH 27X BRD (SUTURE) ×1 IMPLANT
SUT VICRYL 0 AB UR-6 (SUTURE) ×3 IMPLANT
SUTURE MNCRL 4-0 27XMF (SUTURE) ×1 IMPLANT
SYR 30ML LL (SYRINGE) ×3 IMPLANT
SYSTEM WECK SHIELD CLOSURE (TROCAR) IMPLANT
TRAY FOLEY MTR SLVR 16FR STAT (SET/KITS/TRAYS/PACK) ×3 IMPLANT

## 2020-01-11 NOTE — Progress Notes (Signed)
Patient in PACU stated she did not feel ready to go home, that she was weak and in pain. She "had never gone home the same day as surgery before". Notified Dr. Lysle Pearl and he stated he would put an observation order in if she felt like she needed to stay, but the hospital was full and she would most likely have to stay here in recovery for awhile. Patient told she was stable to go home but he could admit her, patient stated she felt like if she could stay a few more hours to "wake up" some more she felt like she could home. Dr. Lysle Pearl agreeable to having to stay a few more hours, and to call him if she felt like she needed to stay and he would place appropriate orders.

## 2020-01-11 NOTE — Interval H&P Note (Signed)
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  The various methods of treatment have been discussed with the patient and family. After consideration of risks, benefits and other options for treatment, the patient has consented to  Procedure(s): XI ROBOTIC LAPAROSCOPIC ASSISTED APPENDECTOMY (N/A) as a surgical intervention.  The patient's history has been reviewed, patient examined, no change in status, stable for surgery.  I have reviewed the patient's chart and labs.  Questions were answered to the patient's satisfaction.     [name]

## 2020-01-11 NOTE — Anesthesia Procedure Notes (Signed)
Procedure Name: Intubation Date/Time: 01/11/2020 10:51 AM Performed by: Justus Memory, CRNA Pre-anesthesia Checklist: Patient identified, Patient being monitored, Timeout performed, Emergency Drugs available and Suction available Patient Re-evaluated:Patient Re-evaluated prior to induction Oxygen Delivery Method: Circle system utilized Preoxygenation: Pre-oxygenation with 100% oxygen Induction Type: IV induction Ventilation: Mask ventilation with difficulty and Oral airway inserted - appropriate to patient size Laryngoscope Size: 3 and McGraph Grade View: Grade I Tube type: Oral Tube size: 7.0 mm Number of attempts: 1 Airway Equipment and Method: Stylet Placement Confirmation: ETT inserted through vocal cords under direct vision,  positive ETCO2 and breath sounds checked- equal and bilateral Secured at: 21 cm Tube secured with: Tape Dental Injury: Teeth and Oropharynx as per pre-operative assessment  Difficulty Due To: Difficulty was anticipated and Difficult Airway- due to large tongue

## 2020-01-11 NOTE — Anesthesia Preprocedure Evaluation (Addendum)
Anesthesia Evaluation  Patient identified by MRN, date of birth, ID band Patient awake    Reviewed: Allergy & Precautions, NPO status , Patient's Chart, lab work & pertinent test results  History of Anesthesia Complications Negative for: history of anesthetic complications  Airway Mallampati: II       Dental  (+) Missing   Pulmonary sleep apnea (not using CPAP) , neg COPD, Not current smoker,           Cardiovascular hypertension, Pt. on medications (-) Past MI and (-) CHF (-) dysrhythmias (-) Valvular Problems/Murmurs     Neuro/Psych neg Seizures Anxiety Depression CVA, No Residual Symptoms    GI/Hepatic Neg liver ROS, GERD  Medicated and Controlled,  Endo/Other  diabetes, Type 2, Oral Hypoglycemic Agents  Renal/GU negative Renal ROS     Musculoskeletal   Abdominal   Peds  Hematology   Anesthesia Other Findings   Reproductive/Obstetrics                            Anesthesia Physical Anesthesia Plan  ASA: III  Anesthesia Plan: General   Post-op Pain Management:    Induction:   PONV Risk Score and Plan: 3 and Ondansetron, Dexamethasone and Treatment may vary due to age or medical condition  Airway Management Planned: Oral ETT  Additional Equipment:   Intra-op Plan:   Post-operative Plan:   Informed Consent: I have reviewed the patients History and Physical, chart, labs and discussed the procedure including the risks, benefits and alternatives for the proposed anesthesia with the patient or authorized representative who has indicated his/her understanding and acceptance.       Plan Discussed with:   Anesthesia Plan Comments:         Anesthesia Quick Evaluation

## 2020-01-11 NOTE — H&P (Signed)
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 female who was referred by Fabian Sharp* for evaluation of above. Hx of wt loss, and episodic diarrhea past several months. Has been better now, but still persists. Multiple social factors contributing, but workup for it showed possible appendiceal mass on CT so here today to discuss possible surgical excision.   Past Medical History: has a past medical history of Acid reflux, Anxiety, Arthritis, B12 deficiency, Bronchitis, Chronic back pain, Chronic vaginitis, Depression, Diabetes mellitus type 2, uncomplicated (CMS-HCC) (0000000), FH: colon cancer (05/10/2014), Fibrocystic breast disease, Herpes zoster (03/2004), History of bone density study (12/23/2010), History of colonic polyps, Hyperlipidemia, Hypertension, Mid back pain on right side (11/09/2017), Migraine headache, Non-cardiac chest pain, PSVT (paroxysmal supraventricular tachycardia) (CMS-HCC), Sleep apnea, Splenomegaly (11/09/2017), Stroke (CMS-HCC) (2008), SUI (stress urinary incontinence, female), and Vertigo.  Past Surgical History: has a past surgical history that includes Hysterectomy; Abdominoplasty; Cholecystectomy; benign breast biopsy; extraction cataract extracapsular w/insertion intraocular prosthesis (Right, 11/19/2013); Eye surgery; extraction cataract extracapsular w/insertion intraocular prosthesis (Left, 12/03/2013); Cholecystectomy; Colonoscopy (08/06/1997); Colonoscopy (10/14/1999); Colonoscopy (02/03/2004); Colonoscopy (02/21/2007); Colonoscopy (05/24/2011); egd (02/03/2004); egd (02/21/2007); Colonoscopy (06/24/2014); and egd (06/24/2014).  Family History: family history includes Alcohol abuse in her mother; Colon cancer in her father; Melanoma in her daughter; Myocardial Infarction (Heart attack) in her father; No Known Problems in her daughter, maternal grandfather, maternal grandmother, paternal grandfather, paternal grandmother, and son; Pneumonia in  her sister; Uterine cancer in her sister.  Social History: reports that she has never smoked. She has never used smokeless tobacco. She reports previous alcohol use. She reports that she does not use drugs.  Current Medications: has a current medication list which includes the following prescription(s): aspirin, atorvastatin, cholecalciferol, clopidogrel, duloxetine, lancing device with lancets, lantus solostar u-100 insulin, metoprolol succinate, onetouch ultra blue test strip, pantoprazole, pen needle, diabetic, trazodone, ulticare pen needle, unifine pentips, and dulaglutide.  Allergies:  Allergies  Allergen Reactions  . Amoxicillin Itching  . Lidocaine Itching  . Penicillin G Itching   ROS:  A 15 point review of systems was performed and pertinent positives and negatives noted in HPI  Objective:    BP 118/67  Ht 162.6 cm (5\' 4" )  Wt 72.1 kg (159 lb)  BMI 27.29 kg/m   Constitutional : alert, appears stated age, cooperative and no distress  Lymphatics/Throat: no asymmetry, masses, or scars  Respiratory: clear to auscultation bilaterally  Cardiovascular: regular rate and rhythm  Gastrointestinal: soft, non-tender; bowel sounds normal; no masses, no organomegaly.  Musculoskeletal: Steady gait and movement  Skin: Cool and moist  Psychiatric: Normal affect, non-agitated, not confused    LABS:  n/a   RADS: CLINICAL DATA: 50 pound weight loss. Anorexia.  EXAM: CT CHEST, ABDOMEN, AND PELVIS WITH CONTRAST  TECHNIQUE: Multidetector CT imaging of the chest, abdomen and pelvis was performed following the standard protocol during bolus administration of intravenous contrast.  CONTRAST: 160mL OMNIPAQUE IOHEXOL 300 MG/ML SOLN  COMPARISON: CT AP 11/07/2017  FINDINGS: CT CHEST FINDINGS  Cardiovascular: The heart size appears within normal limits. No pericardial effusion. Aortic atherosclerosis. Lad, left circumflex and RCA coronary artery  calcifications.  Mediastinum/Nodes: Normal appearance of the thyroid gland. The trachea appears patent and is midline. Normal appearance of the esophagus. At the T1 level there is a paravertebral no enlarged mediastinal or hilar lymph nodes. Ovoid soft tissue nodule measuring 2.3 x 1.7 cm, image 8/2. On cervical spine CT from 04/25/2017 this was present measuring 1.8 x  1.3 cm. 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  Lungs/Pleura: No pleural effusion. No airspace consolidation, atelectasis, or pneumothorax. 2 mm right middle lobe perifissural nodule is identified, image 85/3.  Musculoskeletal: Spondylosis identified within the thoracic spine. No aggressive lytic or sclerotic bone lesions.  CT ABDOMEN PELVIS FINDINGS  Hepatobiliary: There is mild diffuse hepatic steatosis. There is a macrolobular contour the liver with hypertrophy of the lateral segment of left lobe of liver. Squaring of the caudate lobe is also noted. The several simple appearing cysts are identified. Previous cholecystectomy. Common bile duct is increased in caliber measuring up to 1 cm. No choledocholithiasis or mass identified.  Pancreas: Unremarkable. No pancreatic ductal dilatation or surrounding inflammatory changes.  Spleen: The spleen measures 14.0 by 11.7 by 6.0 cm (volume = 510 cm^3) calcified granulomas identified within the spleen.  Adrenals/Urinary Tract: Adrenal glands are unremarkable. Mild bilateral renal volume loss scarring. No mass or hydronephrosis. Small bilateral kidney cysts. Bladder is unremarkable.  Stomach/Bowel: Mid the appendix is abnormally thickened with enhancement. No surrounding inflammatory changes identified. The appendix measures 1.2 cm, image 65/4. This is a new finding when compared with previous exam. The primary differential considerations include appendiceal neoplasm versus  appendicitis.  Vascular/Lymphatic: Aortic atherosclerosis. Small upper abdominal lymph nodes identified without adenopathy. No pelvic or inguinal adenopathy.  Reproductive: Status post hysterectomy. No adnexal masses.  Other: No free fluid or fluid collections.  Musculoskeletal: No acute or significant osseous findings.  IMPRESSION: 1. Abnormal thickening and enhancement of the mid appendix is identified. The primary differential considerations include appendiceal neoplasm versus appendicitis. Careful clinical correlation is advised. 2. 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 3. Splenomegaly. 4. Multi vessel coronary artery atherosclerotic calcifications. 5. 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 This was present on MRI of the thoracic spine from 02/03/2018 and is favored to represent a benign abnormality. 6. 2 mm right middle lobe lung nodule. No follow-up needed if patient is low-risk. Non-contrast chest CT can be considered in 12 months if patient is high-risk. This recommendation follows the consensus statement: Guidelines for Management of Incidental Pulmonary Nodules Detected on CT Images: From the Fleischner Society 2017; Radiology 2017; 284:228-243. 7. Coronary artery calcifications.  Aortic Atherosclerosis (ICD10-I70.0).  These results will be called to the ordering clinician or representative by the Radiologist Assistant, and communication documented in the PACS or [employer].  Electronically Signed By: Kerby Moors M.D. 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 No further dx tests available to further assess exact pathology.  Plan:    1. 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 Alternatives include continued observation. Benefits include possible symptom  relief, pathologic evaluation,   Discussed the risk of surgery including post-op infxn, bleeding, damage to surrounding organs and possible re-operation to address said risks. The risks of general anesthetic, if used, includes MI, CVA, sudden death or even reaction to anesthetic medications also discussed.  Typical post-op recovery time were also discussed.  The patient verbalized understanding and all questions were answered to the patient's satisfaction.

## 2020-01-11 NOTE — Discharge Instructions (Addendum)
Laparoscopic Appendectomy, Care After This sheet gives you information about how to care for yourself after your procedure. Your doctor may also give you more specific instructions. If you have problems or questions, contact your doctor. Follow these instructions at home: Care for cuts from surgery (incisions)   Follow instructions from your doctor about how to take care of your cuts from surgery. Make sure you: ? Wash your hands with soap and water before you change your bandage (dressing). If you cannot use soap and water, use hand sanitizer. ? Change your bandage as told by your doctor. ? Leave stitches (sutures), skin glue, or skin tape (adhesive) strips in place. They may need to stay in place for 2 weeks or longer. If tape strips get loose and curl up, you may trim the loose edges. Do not remove tape strips completely unless your doctor says it is okay.  Do not take baths, swim, or use a hot tub until your doctor says it is okay. OK TO SHOWER 24HRS AFTER YOUR SURGERY.   Check your surgical cut area every day for signs of infection. Check for: ? More redness, swelling, or pain. ? More fluid or blood. ? Warmth. ? Pus or a bad smell. Activity  Do not drive or use heavy machinery while taking prescription pain medicine.  Do not play contact sports until your doctor says it is okay.  Do not drive for 24 hours if you were given a medicine to help you relax (sedative).  Rest as needed. Do not return to work or school until your doctor says it is okay. General instructions .  tylenol and advil as needed for discomfort.  Please alternate between the two every four hours as needed for pain.   .  Use narcotics, if prescribed, only when tylenol and motrin is not enough to control pain. .  325-650mg  every 8hrs to max of 3000mg /24hrs (including the 325mg  in every norco dose) for the tylenol.   .  Advil up to 800mg  per dose every 8hrs as needed for pain.   Marland Kitchen RESUME ASPIRIN AND PLAVIX 48hrs  after surgery  To prevent or treat constipation while you are taking prescription pain medicine, your doctor may recommend that you: ? Drink enough fluid to keep your pee (urine) clear or pale yellow. ? Take over-the-counter or prescription medicines. ? Eat foods that are high in fiber, such as fresh fruits and vegetables, whole grains, and beans. ? Limit foods that are high in fat and processed sugars, such as fried and sweet foods. Contact a doctor if:  You develop a rash.  You have more redness, swelling, or pain around your surgical cuts.  You have more fluid or blood coming from your surgical cuts.  Your surgical cuts feel warm to the touch.  You have pus or a bad smell coming from your surgical cuts.  You have a fever.  One or more of your surgical cuts breaks open. Get help right away if:  You have trouble breathing.  You have chest pain.  You have pain that is getting worse in your shoulders.  You faint or feel dizzy when you stand.  You have very bad pain in your belly (abdomen).  You are sick to your stomach (nauseous) for more than one day.  You have throwing up (vomiting) that lasts for more than one day.  You have leg pain. This information is not intended to replace advice given to you by your health care provider. Make sure  you discuss any questions you have with your health care provider. Document Released: 05/25/2008 Document Revised: 03/06/2016 Document Reviewed: 02/02/2016 Elsevier Interactive Patient Education  2019 [place]   1) The drugs that you were given will stay in your system until tomorrow so for the next 24 hours you should not:  A) Drive an automobile B) Make any legal decisions C) Drink any alcoholic beverage   2) You may resume regular meals tomorrow.  Today it is better to start with liquids and gradually work up to solid foods.  You may eat anything you prefer, but it is  better to start with liquids, then soup and crackers, and gradually work up to solid foods.   3) Please notify your doctor immediately if you have any unusual bleeding, trouble breathing, redness and pain at the surgery site, drainage, fever, or pain not relieved by medication.    4) Additional Instructions:    Please contact your physician with any problems or Same Day Surgery at [phone], Monday through Friday 6 am to 4 pm, or [place] at [facility] number at [phone].

## 2020-01-11 NOTE — Op Note (Signed)
Preoperative diagnosis: Appendiceal neoplasm  Postoperative diagnosis: Same  Procedure: Robotic assisted laparoscopic appendectomy.  Anesthesia: GETA  Surgeon: Benjamine Sprague  Wound Classification: clean contaminated  Specimen: Appendix  Complications: None  Estimated Blood Loss: 3 mL   Indications: Patient is a 79 y.o. female  presented with above.  Please see H&P for further details.    FIndings: 1.  Irritated appendix but otherwise no obvious pathology involving the serosa or surrounding organs 2. No peri-appendiceal abscess or phlegmon 3. Normal anatomy 4. Appendiceal artery ligated and divided with stapler 5. Adequate hemostasis.   Description of procedure: The patient was placed on the operating table in the supine position, left arm tucked. General anesthesia was induced. A time-out was completed verifying correct patient, procedure, site, positioning, and implant(s) and/or special equipment prior to beginning this procedure. The abdomen was prepped and draped in the usual sterile fashion.   Palmer's point located and Veress needle was inserted.  After confirming 2 clicks and a positive saline drop test, gas insufflation was initiated until the abdominal pressure was measured at 15 mmHg.  Afterwards, the Veress needle was removed and a 8 mm port was placed tsing Optiview technique in left upper quadrant. after local was infused, 2 additional incision was made 8 cm apart along the left side of the abdominal wall from the initial incision.  An 12 mm port was placed at the left lower quadrant port, 8 mm port at the umbilicus under direct visualization.   The table was placed in the Trendelenburg position with the right side elevated.  Xi robotic platform was then brought to the operative field and docked at an angle from the left lower quadrant.   An irritated but otherwise normal serosal appendix was identified and elevated.  No other pathology was noted within the surrounding  tissue.  Window created at base of appendix in the mesentery.   An  blue load linear cutting stapler was then used to divide and staple the base of the appendix. It was reloaded with a vascular cartridge and the mesoappendix similarly divided.  Bleeding from the staple line was controlled with electrocautery.  The appendix was placed in an endoscopic retrieval bag and removed.   The appendiceal stump was examined and hemostasis noted. No other pathology was identified within pelvis. The 12 mm trocar removed and port site closed with PMI using 0 vicryl under direct vision. Remaining trocars were removed under direct vision. No bleeding was noted.The abdomen was allowed to collapse.All skin incisions then closed with subcuticular sutures Monocryl 4-0.  Wounds then dressed with dermabond.  The patient tolerated the procedure well, awakened from anesthesia and was taken to the postanesthesia care unit in satisfactory condition.  Sponge count and instrument count correct at the end of the procedure.

## 2020-01-11 NOTE — H&P (View-Only) (Signed)
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 female who was referred by Fabian Sharp* for evaluation of above. Hx of wt loss, and episodic diarrhea past several months. Has been better now, but still persists. Multiple social factors contributing, but workup for it showed possible appendiceal mass on CT so here today to discuss possible surgical excision.   Past Medical History: has a past medical history of Acid reflux, Anxiety, Arthritis, B12 deficiency, Bronchitis, Chronic back pain, Chronic vaginitis, Depression, Diabetes mellitus type 2, uncomplicated (CMS-HCC) (0000000), FH: colon cancer (05/10/2014), Fibrocystic breast disease, Herpes zoster (03/2004), History of bone density study (12/23/2010), History of colonic polyps, Hyperlipidemia, Hypertension, Mid back pain on right side (11/09/2017), Migraine headache, Non-cardiac chest pain, PSVT (paroxysmal supraventricular tachycardia) (CMS-HCC), Sleep apnea, Splenomegaly (11/09/2017), Stroke (CMS-HCC) (2008), SUI (stress urinary incontinence, female), and Vertigo.  Past Surgical History: has a past surgical history that includes Hysterectomy; Abdominoplasty; Cholecystectomy; benign breast biopsy; extraction cataract extracapsular w/insertion intraocular prosthesis (Right, 11/19/2013); Eye surgery; extraction cataract extracapsular w/insertion intraocular prosthesis (Left, 12/03/2013); Cholecystectomy; Colonoscopy (08/06/1997); Colonoscopy (10/14/1999); Colonoscopy (02/03/2004); Colonoscopy (02/21/2007); Colonoscopy (05/24/2011); egd (02/03/2004); egd (02/21/2007); Colonoscopy (06/24/2014); and egd (06/24/2014).  Family History: family history includes Alcohol abuse in her mother; Colon cancer in her father; Melanoma in her daughter; Myocardial Infarction (Heart attack) in her father; No Known Problems in her daughter, maternal grandfather, maternal grandmother, paternal grandfather, paternal grandmother, and son; Pneumonia in  her sister; Uterine cancer in her sister.  Social History: reports that she has never smoked. She has never used smokeless tobacco. She reports previous alcohol use. She reports that she does not use drugs.  Current Medications: has a current medication list which includes the following prescription(s): aspirin, atorvastatin, cholecalciferol, clopidogrel, duloxetine, lancing device with lancets, lantus solostar u-100 insulin, metoprolol succinate, onetouch ultra blue test strip, pantoprazole, pen needle, diabetic, trazodone, ulticare pen needle, unifine pentips, and dulaglutide.  Allergies:  Allergies  Allergen Reactions  . Amoxicillin Itching  . Lidocaine Itching  . Penicillin G Itching   ROS:  A 15 point review of systems was performed and pertinent positives and negatives noted in HPI  Objective:    BP 118/67  Ht 162.6 cm (5\' 4" )  Wt 72.1 kg (159 lb)  BMI 27.29 kg/m   Constitutional : alert, appears stated age, cooperative and no distress  Lymphatics/Throat: no asymmetry, masses, or scars  Respiratory: clear to auscultation bilaterally  Cardiovascular: regular rate and rhythm  Gastrointestinal: soft, non-tender; bowel sounds normal; no masses, no organomegaly.  Musculoskeletal: Steady gait and movement  Skin: Cool and moist  Psychiatric: Normal affect, non-agitated, not confused    LABS:  n/a   RADS: CLINICAL DATA: 50 pound weight loss. Anorexia.  EXAM: CT CHEST, ABDOMEN, AND PELVIS WITH CONTRAST  TECHNIQUE: Multidetector CT imaging of the chest, abdomen and pelvis was performed following the standard protocol during bolus administration of intravenous contrast.  CONTRAST: 180mL OMNIPAQUE IOHEXOL 300 MG/ML SOLN  COMPARISON: CT AP 11/07/2017  FINDINGS: CT CHEST FINDINGS  Cardiovascular: The heart size appears within normal limits. No pericardial effusion. Aortic atherosclerosis. Lad, left circumflex and RCA coronary artery  calcifications.  Mediastinum/Nodes: Normal appearance of the thyroid gland. The trachea appears patent and is midline. Normal appearance of the esophagus. At the T1 level there is a paravertebral no enlarged mediastinal or hilar lymph nodes. Ovoid soft tissue nodule measuring 2.3 x 1.7 cm, image 8/2. On cervical spine CT from 04/25/2017 this was present measuring 1.8 x  1.3 cm. 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  Lungs/Pleura: No pleural effusion. No airspace consolidation, atelectasis, or pneumothorax. 2 mm right middle lobe perifissural nodule is identified, image 85/3.  Musculoskeletal: Spondylosis identified within the thoracic spine. No aggressive lytic or sclerotic bone lesions.  CT ABDOMEN PELVIS FINDINGS  Hepatobiliary: There is mild diffuse hepatic steatosis. There is a macrolobular contour the liver with hypertrophy of the lateral segment of left lobe of liver. Squaring of the caudate lobe is also noted. The several simple appearing cysts are identified. Previous cholecystectomy. Common bile duct is increased in caliber measuring up to 1 cm. No choledocholithiasis or mass identified.  Pancreas: Unremarkable. No pancreatic ductal dilatation or surrounding inflammatory changes.  Spleen: The spleen measures 14.0 by 11.7 by 6.0 cm (volume = 510 cm^3) calcified granulomas identified within the spleen.  Adrenals/Urinary Tract: Adrenal glands are unremarkable. Mild bilateral renal volume loss scarring. No mass or hydronephrosis. Small bilateral kidney cysts. Bladder is unremarkable.  Stomach/Bowel: Mid the appendix is abnormally thickened with enhancement. No surrounding inflammatory changes identified. The appendix measures 1.2 cm, image 65/4. This is a new finding when compared with previous exam. The primary differential considerations include appendiceal neoplasm versus  appendicitis.  Vascular/Lymphatic: Aortic atherosclerosis. Small upper abdominal lymph nodes identified without adenopathy. No pelvic or inguinal adenopathy.  Reproductive: Status post hysterectomy. No adnexal masses.  Other: No free fluid or fluid collections.  Musculoskeletal: No acute or significant osseous findings.  IMPRESSION: 1. Abnormal thickening and enhancement of the mid appendix is identified. The primary differential considerations include appendiceal neoplasm versus appendicitis. Careful clinical correlation is advised. 2. 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 3. Splenomegaly. 4. Multi vessel coronary artery atherosclerotic calcifications. 5. 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 This was present on MRI of the thoracic spine from 02/03/2018 and is favored to represent a benign abnormality. 6. 2 mm right middle lobe lung nodule. No follow-up needed if patient is low-risk. Non-contrast chest CT can be considered in 12 months if patient is high-risk. This recommendation follows the consensus statement: Guidelines for Management of Incidental Pulmonary Nodules Detected on CT Images: From the Fleischner Society 2017; Radiology 2017; 284:228-243. 7. Coronary artery calcifications.  Aortic Atherosclerosis (ICD10-I70.0).  These results will be called to the ordering clinician or representative by the Radiologist Assistant, and communication documented in the PACS or [employer].  Electronically Signed By: Kerby Moors M.D. 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 No further dx tests available to further assess exact pathology.  Plan:    1. 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 Alternatives include continued observation. Benefits include possible symptom  relief, pathologic evaluation,   Discussed the risk of surgery including post-op infxn, bleeding, damage to surrounding organs and possible re-operation to address said risks. The risks of general anesthetic, if used, includes MI, CVA, sudden death or even reaction to anesthetic medications also discussed.  Typical post-op recovery time were also discussed.  The patient verbalized understanding and all questions were answered to the patient's satisfaction.

## 2020-01-11 NOTE — Transfer of Care (Signed)
Immediate Anesthesia Transfer of Care Note  Patient: Kristen Guerra  Procedure(s) Performed: XI ROBOTIC LAPAROSCOPIC ASSISTED APPENDECTOMY (N/A Abdomen)  Patient Location: PACU  Anesthesia Type:General  Level of Consciousness: sedated  Airway & Oxygen Therapy: Patient Spontanous Breathing and Patient connected to face mask oxygen  Post-op Assessment: Report given to RN and Post -op Vital signs reviewed and stable  Post vital signs: Reviewed and stable  Last Vitals:  Vitals Value Taken Time  BP 146/67 01/11/20 1210  Temp    Pulse 81 01/11/20 1214  Resp 18 01/11/20 1214  SpO2 100 % 01/11/20 1214  Vitals shown include unvalidated device data.  Last Pain:  Vitals:   01/11/20 0910  TempSrc: Tympanic  PainSc: 0-No pain         Complications: No apparent anesthesia complications

## 2020-01-11 NOTE — OR Nursing (Signed)
Tolerating po's; pain controlled with prns; ambulatory with walker and tolerated well, to bathroom for void.  MD aware of same, states ok to discharge to home via orders and secure chat.

## 2020-01-12 ENCOUNTER — Emergency Department
Admission: EM | Admit: 2020-01-12 | Discharge: 2020-01-12 | Disposition: A | Discharge status: Home / Self Care | Payer: Medicare Other | Attending: Emergency Medicine | Admitting: Emergency Medicine

## 2020-01-12 ENCOUNTER — Emergency Department: Payer: Medicare Other

## 2020-01-12 ENCOUNTER — Other Ambulatory Visit: Payer: Self-pay

## 2020-01-12 DIAGNOSIS — G8918 Other acute postprocedural pain: Secondary | ICD-10-CM | POA: Diagnosis not present

## 2020-01-12 DIAGNOSIS — I10 Essential (primary) hypertension: Secondary | ICD-10-CM | POA: Insufficient documentation

## 2020-01-12 DIAGNOSIS — E119 Type 2 diabetes mellitus without complications: Secondary | ICD-10-CM | POA: Insufficient documentation

## 2020-01-12 DIAGNOSIS — R109 Unspecified abdominal pain: Secondary | ICD-10-CM | POA: Diagnosis present

## 2020-01-12 LAB — URINALYSIS, COMPLETE (UACMP) WITH MICROSCOPIC
Bilirubin Urine: NEGATIVE
Glucose, UA: NEGATIVE mg/dL
Hgb urine dipstick: NEGATIVE
Ketones, ur: NEGATIVE mg/dL
Nitrite: NEGATIVE
Protein, ur: NEGATIVE mg/dL
Specific Gravity, Urine: 1.013 (ref 1.005–1.030)
pH: 5 (ref 5.0–8.0)

## 2020-01-12 LAB — CBC
HCT: 43.3 % (ref 36.0–46.0)
Hemoglobin: 13.7 g/dL (ref 12.0–15.0)
MCH: 24.5 pg — ABNORMAL LOW (ref 26.0–34.0)
MCHC: 31.6 g/dL (ref 30.0–36.0)
MCV: 77.5 fL — ABNORMAL LOW (ref 80.0–100.0)
Platelets: 320 10*3/uL (ref 150–400)
RBC: 5.59 MIL/uL — ABNORMAL HIGH (ref 3.87–5.11)
RDW: 15.8 % — ABNORMAL HIGH (ref 11.5–15.5)
WBC: 11.6 10*3/uL — ABNORMAL HIGH (ref 4.0–10.5)
nRBC: 0 % (ref 0.0–0.2)

## 2020-01-12 LAB — BASIC METABOLIC PANEL
Anion gap: 8 (ref 5–15)
BUN: 13 mg/dL (ref 8–23)
CO2: 25 mmol/L (ref 22–32)
Calcium: 9.1 mg/dL (ref 8.9–10.3)
Chloride: 106 mmol/L (ref 98–111)
Creatinine, Ser: 1.01 mg/dL — ABNORMAL HIGH (ref 0.44–1.00)
GFR calc Af Amer: >60 mL/min (ref >60)
GFR calc non Af Amer: 53 mL/min — ABNORMAL LOW (ref >60)
Glucose, Bld: 144 mg/dL — ABNORMAL HIGH (ref 70–99)
Potassium: 3.6 mmol/L (ref 3.5–5.1)
Sodium: 139 mmol/L (ref 135–145)

## 2020-01-12 LAB — TROPONIN I (HIGH SENSITIVITY): Troponin I (High Sensitivity): 11 ng/L (ref <18)

## 2020-01-12 MED ORDER — IOHEXOL 300 MG/ML  SOLN
100.0000 mL | Freq: Once | INTRAMUSCULAR | Status: AC | PRN
  Administered 2020-01-12: 100 mL via INTRAVENOUS

## 2020-01-12 NOTE — ED Provider Notes (Signed)
[facility] Department Provider Note       Time seen: ----------------------------------------- 7:48 PM on 01/12/2020 -----------------------------------------   I have reviewed the vital signs and the nursing notes.  HISTORY   Chief Complaint Flank Pain    HPI Kristen Guerra is a 79 y.o. female with a history of anxiety, arthritis, depression, diabetes, GERD, hypertension, SVT, CVA who presents today for her left flank pain.  Patient arrives from independent living at [facility].  Had appendectomy for mass removal yesterday and was sent home with hydrocodone.  Left leg pain started at 1800 today.  She states the pain scared her and she presents to the ER for further evaluation.  Past Medical History:  Diagnosis Date  . Anxiety   . Arthritis   . Complication of anesthesia 2015   had trouble waking up after dental work  . Depression   . Diabetes mellitus without complication ([place])   . GERD (gastroesophageal reflux disease)   . High cholesterol   . Hypertension   . PSVT (paroxysmal supraventricular tachycardia) ([place])   . Sleep apnea   . Stroke ([place]) 2017  . SUI (stress urinary incontinence, female)     Past Surgical History:  Procedure Laterality Date  . ABDOMINAL HYSTERECTOMY    . BREAST BIOPSY Right 1977   negative  . BREAST BIOPSY Right 1988   negative  . BREAST SURGERY    . CHOLECYSTECTOMY    . EYE SURGERY     cataract  . GALLBLADDER SURGERY    . SCALENOTOMY W/O RESECTION CERVICAL RIB      Allergies Amoxicillin and Lidocaine   Review of Systems Constitutional: Negative for fever. Cardiovascular: Negative for chest pain. Respiratory: Negative for shortness of breath. Gastrointestinal: Positive for flank pain Musculoskeletal: Negative for back pain. Skin: Negative for rash. Neurological: Negative for headaches, focal weakness or numbness.  All systems negative/normal/unremarkable except as stated in the  HPI  ____________________________________________   PHYSICAL EXAM:  VITAL SIGNS: ED Triage Vitals  Enc Vitals Group     BP 01/12/20 1927 (!) 155/78     Pulse Rate 01/12/20 1927 76     Resp 01/12/20 1927 16     Temp 01/12/20 1927 98.3 F (36.8 C)     Temp Source 01/12/20 1927 Oral     SpO2 01/12/20 1927 98 %     Weight 01/12/20 1934 156 lb 8.4 oz (71 kg)     Height 01/12/20 1934 5\' 5"  (1.651 m)     Head Circumference --      Peak Flow --      Pain Score 01/12/20 1934 7     Pain Loc --      Pain Edu? --      Excl. in [place]? --     Constitutional: Alert and oriented.  Anxious, no distress Eyes: Conjunctivae are normal. Normal extraocular movements. Cardiovascular: Normal rate, regular rhythm. No murmurs, rubs, or gallops. Respiratory: Normal respiratory effort without tachypnea nor retractions. Breath sounds are clear and equal bilaterally. No wheezes/rales/rhonchi. Gastrointestinal: Left leg tenderness, no rebound or guarding.  Normal bowel sounds. Musculoskeletal: Nontender with normal range of motion in extremities. No lower extremity tenderness nor edema. Neurologic:  Normal speech and language. No gross focal neurologic deficits are appreciated.  Skin:  Skin is warm, dry and intact. No rash noted. Psychiatric: Speech and behavior are normal.  ____________________________________________  EKG: Interpreted by me.  Sinus rhythm with rate of 73 bpm, left bundle branch block, normal QT  ____________________________________________   LABS (pertinent positives/negatives)  Labs Reviewed  URINALYSIS, COMPLETE (UACMP) WITH MICROSCOPIC - Abnormal; Notable for the following components:      Result Value   Color, Urine YELLOW (*)    APPearance CLOUDY (*)    Leukocytes,Ua TRACE (*)    Bacteria, UA MANY (*)    All other components within normal limits  BASIC METABOLIC PANEL - Abnormal; Notable for the following components:   Glucose, Bld 144 (*)    Creatinine, Ser 1.01 (*)     GFR calc non Af Amer 53 (*)    All other components within normal limits  CBC - Abnormal; Notable for the following components:   WBC 11.6 (*)    RBC 5.59 (*)    MCV 77.5 (*)    MCH 24.5 (*)    RDW 15.8 (*)    All other components within normal limits  TROPONIN I (HIGH SENSITIVITY)     RADIOLOGY  CT of the abdomen pelvis with contrast IMPRESSION: 1. Status post appendectomy with mild surrounding fat stranding changes in the right lower quadrant. No loculated fluid collections are seen. 2. Small amount of pneumoperitoneum under the hemidiaphragm, likely from recent postsurgical changes. 3. Subcutaneous emphysema with postsurgical changes along the anterior abdominal wall. No loculated fluid collections. 4. Stable mild hepatic steatosis and splenomegaly.  DIFFERENTIAL DIAGNOSIS  Renal colic, UTI, pyelonephritis, postoperative pain, hemorrhage, intra-abdominal infection   ASSESSMENT AND PLAN  Flank pain   Plan: The patient had presented for postoperative left flank pain. Patient's labs did not reveal any acute process.  This is likely normal postoperative pain.  CT was reassuring.  She is cleared for outpatient follow-up.  Lenise Arena MD    Note: This note was generated in part or whole with voice recognition software. Voice recognition is usually quite accurate but there are transcription errors that can and very often do occur. I apologize for any typographical errors that were not detected and corrected.     Earleen Newport, MD 01/12/20 2223

## 2020-01-12 NOTE — ED Triage Notes (Signed)
Pt to ED via [facility] from independent living at [facility]. Per EMS pt had appendectomy yesterday and was sent home with hydrocodone. Pt stating left flank pain started at 1800 today and took pain medicine with no relief. Pt showed left bundle branch while in hospital. Pt A&Ox4. Pt in NAD.   EMS VS:  BP 155/77 CBG 133 O2 97% HR 74

## 2020-01-14 LAB — SURGICAL PATHOLOGY

## 2020-01-15 LAB — URINE CULTURE: Culture: 80000 — AB

## 2020-01-16 NOTE — Progress Notes (Signed)
ED Antimicrobial Stewardship Positive Culture Follow Up   Kristen Guerra is an 79 y.o. female who presented to [facility] on 01/12/2020 with a chief complaint of  Chief Complaint  Patient presents with  . Flank Pain    Recent Results (from the past 720 hour(s))  SARS CORONAVIRUS 2 (TAT 6-24 HRS) Nasopharyngeal Nasopharyngeal Swab     Status: None   Collection Time: 12/19/19  8:29 AM   Specimen: Nasopharyngeal Swab  Result Value Ref Range Status   SARS Coronavirus 2 NEGATIVE NEGATIVE Final    Comment: (NOTE) SARS-CoV-2 target nucleic acids are NOT DETECTED. The SARS-CoV-2 RNA is generally detectable in upper and lower respiratory specimens during the acute phase of infection. Negative results do not preclude SARS-CoV-2 infection, do not rule out co-infections with other pathogens, and should not be used as the sole basis for treatment or other patient management decisions. Negative results must be combined with clinical observations, patient history, and epidemiological information. The expected result is Negative. Fact Sheet for Patients: SugarRoll.be Fact Sheet for Healthcare Providers: https://www.woods-mathews.com/ This test is not yet approved or cleared by the [place] FDA and  has been authorized for detection and/or diagnosis of SARS-CoV-2 by FDA under an Emergency Use Authorization (EUA). This EUA will remain  in effect (meaning this test can be used) for the duration of the COVID-19 declaration under Section 56 4(b)(1) of the Act, 21 U.S.C. section 360bbb-3(b)(1), unless the authorization is terminated or revoked sooner. Performed at [facility], [place] [address], [place], [place] [address]   SARS CORONAVIRUS 2 (TAT 6-24 HRS) Nasopharyngeal Nasopharyngeal Swab     Status: None   Collection Time: 01/09/20 10:53 AM   Specimen: Nasopharyngeal Swab  Result Value Ref Range Status   SARS Coronavirus 2 NEGATIVE  NEGATIVE Final    Comment: (NOTE) SARS-CoV-2 target nucleic acids are NOT DETECTED. The SARS-CoV-2 RNA is generally detectable in upper and lower respiratory specimens during the acute phase of infection. Negative results do not preclude SARS-CoV-2 infection, do not rule out co-infections with other pathogens, and should not be used as the sole basis for treatment or other patient management decisions. Negative results must be combined with clinical observations, patient history, and epidemiological information. The expected result is Negative. Fact Sheet for Patients: SugarRoll.be Fact Sheet for Healthcare Providers: https://www.woods-mathews.com/ This test is not yet approved or cleared by the [place] FDA and  has been authorized for detection and/or diagnosis of SARS-CoV-2 by FDA under an Emergency Use Authorization (EUA). This EUA will remain  in effect (meaning this test can be used) for the duration of the COVID-19 declaration under Section 56 4(b)(1) of the Act, 21 U.S.C. section 360bbb-3(b)(1), unless the authorization is terminated or revoked sooner. Performed at [facility], [place] [address] [place], [place] [address]   Urine culture     Status: Abnormal   Collection Time: 01/12/20  7:51 PM   Specimen: Urine, Random  Result Value Ref Range Status   Specimen Description   Final    URINE, RANDOM Performed at [facility], [address], [place], [place] [address]    Special Requests   Final    NONE Performed at [facility], [place], [place] [address]    Culture 80,000 COLONIES/mL ENTEROCOCCUS FAECALIS (A)  Final   Report Status 01/15/2020 FINAL  Final   Organism ID, Bacteria ENTEROCOCCUS FAECALIS (A)  Final      Susceptibility   Enterococcus  faecalis - MIC*    AMPICILLIN <=2 SENSITIVE Sensitive     NITROFURANTOIN <=16 SENSITIVE Sensitive     VANCOMYCIN 1 SENSITIVE Sensitive      * 80,000 COLONIES/mL ENTEROCOCCUS FAECALIS    Patient discharged originally without antimicrobial agent.   Patient seen in ED for postoperative Flank Pain on 5/15. She recently had laparoscopic appendectomy (5/14).  According  assessment/plan  "Patient's labs did not reveal any acute process.  This is likely normal postoperative pain.  CT was reassuring."    UA showed Many Bacteria, WBC: 6-10, but was positive for Squamous Epithelial cells (6-10). Ucx positive for 80,000 Enterococcus faecalis.  I spoke to patient and she said all her symptoms have resolved. She said that she recently had another UA done that had bacteria in it and her doctor was not concerned because she had no symptoms. She also told me that she would really not like to take any antibiotics if she didn't have too.   UA does not appear to be a clean catch and patient is asymptomatic. After discussion with provider, will continue with NO antibiotics at this time.  ED Provider: Dr. Lenise Arena    [facility] M Yon Schiffman 01/16/2020, 1:38 PM Clinical Pharmacist Monday - Friday phone -  [phone] Saturday - Sunday phone - [phone]

## 2020-01-17 NOTE — Anesthesia Postprocedure Evaluation (Signed)
Anesthesia Post Note  Patient: RADONNA TAIT  Procedure(s) Performed: XI ROBOTIC LAPAROSCOPIC ASSISTED APPENDECTOMY (N/A Abdomen)  Patient location during evaluation: PACU Anesthesia Type: General Level of consciousness: awake and alert Pain management: pain level controlled Vital Signs Assessment: post-procedure vital signs reviewed and stable Respiratory status: spontaneous breathing, nonlabored ventilation, respiratory function stable and patient connected to nasal cannula oxygen Cardiovascular status: blood pressure returned to baseline and stable Postop Assessment: no apparent nausea or vomiting Anesthetic complications: no     Last Vitals:  Vitals:   01/11/20 1529 01/11/20 1556  BP: (!) 157/58 (!) 151/62  Pulse: 65 68  Resp: 16 16  Temp:    SpO2: 100% 98%    Last Pain:  Vitals:   01/11/20 1433  TempSrc: Temporal                 Alphonsus Sias

## 2020-01-17 NOTE — Anesthesia Postprocedure Evaluation (Signed)
Anesthesia Post Note  Patient: Kristen Guerra  Procedure(s) Performed: XI ROBOTIC LAPAROSCOPIC ASSISTED APPENDECTOMY (N/A Abdomen)  Patient location during evaluation: PACU Anesthesia Type: General Level of consciousness: awake and alert Pain management: pain level controlled Vital Signs Assessment: post-procedure vital signs reviewed and stable Respiratory status: spontaneous breathing, nonlabored ventilation, respiratory function stable and patient connected to nasal cannula oxygen Cardiovascular status: blood pressure returned to baseline and stable Postop Assessment: no apparent nausea or vomiting Anesthetic complications: no     Last Vitals:  Vitals:   01/11/20 1529 01/11/20 1556  BP: (!) 157/58 (!) 151/62  Pulse: 65 68  Resp: 16 16  Temp:    SpO2: 100% 98%    Last Pain:  Vitals:   01/11/20 1433  TempSrc: Temporal                 Alphonsus Sias

## 2020-02-13 ENCOUNTER — Telehealth: Payer: Self-pay | Admitting: *Deleted

## 2020-02-18 NOTE — Progress Notes (Deleted)
No show to appointment.

## 2020-02-20 ENCOUNTER — Ambulatory Visit: Payer: Medicare Other | Admitting: Pain Medicine

## 2020-02-20 ENCOUNTER — Telehealth: Payer: Self-pay

## 2020-02-20 DIAGNOSIS — G8929 Other chronic pain: Secondary | ICD-10-CM | POA: Insufficient documentation

## 2020-02-20 DIAGNOSIS — N393 Stress incontinence (female) (male): Secondary | ICD-10-CM | POA: Insufficient documentation

## 2020-02-20 DIAGNOSIS — F419 Anxiety disorder, unspecified: Secondary | ICD-10-CM | POA: Insufficient documentation

## 2020-02-20 DIAGNOSIS — M899 Disorder of bone, unspecified: Secondary | ICD-10-CM | POA: Insufficient documentation

## 2020-02-20 DIAGNOSIS — H2510 Age-related nuclear cataract, unspecified eye: Secondary | ICD-10-CM | POA: Insufficient documentation

## 2020-02-20 DIAGNOSIS — G4730 Sleep apnea, unspecified: Secondary | ICD-10-CM | POA: Insufficient documentation

## 2020-02-20 DIAGNOSIS — Z789 Other specified health status: Secondary | ICD-10-CM | POA: Insufficient documentation

## 2020-02-20 DIAGNOSIS — N761 Subacute and chronic vaginitis: Secondary | ICD-10-CM | POA: Insufficient documentation

## 2020-02-20 DIAGNOSIS — N6019 Diffuse cystic mastopathy of unspecified breast: Secondary | ICD-10-CM | POA: Insufficient documentation

## 2020-02-20 DIAGNOSIS — G43909 Migraine, unspecified, not intractable, without status migrainosus: Secondary | ICD-10-CM | POA: Insufficient documentation

## 2020-02-20 DIAGNOSIS — G894 Chronic pain syndrome: Secondary | ICD-10-CM | POA: Insufficient documentation

## 2020-02-20 DIAGNOSIS — Z79899 Other long term (current) drug therapy: Secondary | ICD-10-CM | POA: Insufficient documentation

## 2020-02-20 DIAGNOSIS — E538 Deficiency of other specified B group vitamins: Secondary | ICD-10-CM | POA: Insufficient documentation

## 2020-02-20 DIAGNOSIS — R0789 Other chest pain: Secondary | ICD-10-CM | POA: Insufficient documentation

## 2020-02-20 DIAGNOSIS — Z86010 Personal history of colonic polyps: Secondary | ICD-10-CM | POA: Insufficient documentation

## 2020-02-20 DIAGNOSIS — M549 Dorsalgia, unspecified: Secondary | ICD-10-CM | POA: Insufficient documentation

## 2020-03-15 ENCOUNTER — Other Ambulatory Visit: Payer: Self-pay

## 2020-03-15 ENCOUNTER — Emergency Department: Payer: Medicare Other

## 2020-03-15 ENCOUNTER — Emergency Department
Admission: EM | Admit: 2020-03-15 | Discharge: 2020-03-15 | Disposition: A | Discharge status: Home / Self Care | Payer: Medicare Other | Attending: Emergency Medicine | Admitting: Emergency Medicine

## 2020-03-15 DIAGNOSIS — Z794 Long term (current) use of insulin: Secondary | ICD-10-CM | POA: Diagnosis not present

## 2020-03-15 DIAGNOSIS — R4182 Altered mental status, unspecified: Secondary | ICD-10-CM | POA: Insufficient documentation

## 2020-03-15 DIAGNOSIS — Z7982 Long term (current) use of aspirin: Secondary | ICD-10-CM | POA: Insufficient documentation

## 2020-03-15 DIAGNOSIS — Z79899 Other long term (current) drug therapy: Secondary | ICD-10-CM | POA: Insufficient documentation

## 2020-03-15 DIAGNOSIS — E119 Type 2 diabetes mellitus without complications: Secondary | ICD-10-CM | POA: Insufficient documentation

## 2020-03-15 DIAGNOSIS — I10 Essential (primary) hypertension: Secondary | ICD-10-CM | POA: Insufficient documentation

## 2020-03-15 DIAGNOSIS — R791 Abnormal coagulation profile: Secondary | ICD-10-CM | POA: Insufficient documentation

## 2020-03-15 DIAGNOSIS — E162 Hypoglycemia, unspecified: Secondary | ICD-10-CM

## 2020-03-15 LAB — COMPREHENSIVE METABOLIC PANEL
ALT: 14 U/L (ref 0–44)
AST: 18 U/L (ref 15–41)
Albumin: 4.3 g/dL (ref 3.5–5.0)
Alkaline Phosphatase: 49 U/L (ref 38–126)
Anion gap: 9 (ref 5–15)
BUN: 16 mg/dL (ref 8–23)
CO2: 23 mmol/L (ref 22–32)
Calcium: 9.7 mg/dL (ref 8.9–10.3)
Chloride: 107 mmol/L (ref 98–111)
Creatinine, Ser: 0.97 mg/dL (ref 0.44–1.00)
GFR calc Af Amer: >60 mL/min (ref >60)
GFR calc non Af Amer: 56 mL/min — ABNORMAL LOW (ref >60)
Glucose, Bld: 145 mg/dL — ABNORMAL HIGH (ref 70–99)
Potassium: 3.5 mmol/L (ref 3.5–5.1)
Sodium: 139 mmol/L (ref 135–145)
Total Bilirubin: 0.6 mg/dL (ref 0.3–1.2)
Total Protein: 7.4 g/dL (ref 6.5–8.1)

## 2020-03-15 LAB — DIFFERENTIAL
Abs Immature Granulocytes: 0.05 10*3/uL (ref 0.00–0.07)
Basophils Absolute: 0.1 10*3/uL (ref 0.0–0.1)
Basophils Relative: 1 %
Eosinophils Absolute: 0.3 10*3/uL (ref 0.0–0.5)
Eosinophils Relative: 3 %
Immature Granulocytes: 1 %
Lymphocytes Relative: 18 %
Lymphs Abs: 1.9 10*3/uL (ref 0.7–4.0)
Monocytes Absolute: 0.6 10*3/uL (ref 0.1–1.0)
Monocytes Relative: 6 %
Neutro Abs: 7.6 10*3/uL (ref 1.7–7.7)
Neutrophils Relative %: 71 %

## 2020-03-15 LAB — CBC
HCT: 50 % — ABNORMAL HIGH (ref 36.0–46.0)
Hemoglobin: 15.6 g/dL — ABNORMAL HIGH (ref 12.0–15.0)
MCH: 25.1 pg — ABNORMAL LOW (ref 26.0–34.0)
MCHC: 31.2 g/dL (ref 30.0–36.0)
MCV: 80.5 fL (ref 80.0–100.0)
Platelets: 526 10*3/uL — ABNORMAL HIGH (ref 150–400)
RBC: 6.21 MIL/uL — ABNORMAL HIGH (ref 3.87–5.11)
RDW: 17.5 % — ABNORMAL HIGH (ref 11.5–15.5)
WBC: 10.6 10*3/uL — ABNORMAL HIGH (ref 4.0–10.5)
nRBC: 0 % (ref 0.0–0.2)

## 2020-03-15 LAB — PROTIME-INR
INR: 1.1 (ref 0.8–1.2)
Prothrombin Time: 13.8 seconds (ref 11.4–15.2)

## 2020-03-15 LAB — APTT: aPTT: 32 seconds (ref 24–36)

## 2020-03-15 LAB — GLUCOSE, CAPILLARY: Glucose-Capillary: 134 mg/dL — ABNORMAL HIGH (ref 70–99)

## 2020-03-15 MED ORDER — ACETAMINOPHEN 325 MG PO TABS
650.0000 mg | ORAL_TABLET | Freq: Once | ORAL | Status: AC | PRN
  Administered 2020-03-15: 650 mg via ORAL
  Filled 2020-03-15: qty 2

## 2020-03-15 NOTE — ED Notes (Signed)
Pt c/o HA, passed swallow screen, given PO tylenol in triage and placed in lobby chair instead of wheelchair for comfort.

## 2020-03-15 NOTE — Discharge Instructions (Signed)
As we discussed, it is VERY IMPORTANT to eat AT LEAST 3 meals a day on your medications  Try to keep snacks in Kristen Guerra of your house to help prevent this

## 2020-03-15 NOTE — ED Provider Notes (Signed)
[facility] Emergency Department Provider Note  ____________________________________________   First MD Initiated Contact with Patient 03/15/20 1915     (approximate)  I have reviewed the triage vital signs and the nursing notes.   HISTORY  Chief Complaint Altered Mental Status    HPI Kristen Guerra is a 79 y.o. female with past medical history as below here with transient confusion.  The patient states that earlier today, she was playing bridge with friends.  She had not eaten breakfast yet.  She began to feel confused.  She felt shaky.  She felt like she could not focus.  Her friends noticed that she was having a hard time focusing and was confused.  They brought her to her house and her neighbors gave her something to eat which did improve her symptoms.  However, this is now happened twice recently, so she was told to come to ED by her PCP.  She states that since arriving, she has felt well and is back to her baseline.  She does note that she takes injectable insulin once a week, and often does not eat or drink.  Of note, since passage of her husband last year, she has lost 60 pounds due to not eating and drinking.  Denies any other recent fever, chills, or other complaints.  No focal numbness or weakness.        Past Medical History:  Diagnosis Date  . Anxiety   . Arthritis   . Complication of anesthesia 2015   had trouble waking up after dental work  . Depression   . Diabetes mellitus without complication ([place])   . GERD (gastroesophageal reflux disease)   . High cholesterol   . Hypertension   . PSVT (paroxysmal supraventricular tachycardia) ([place])   . Sleep apnea   . Stroke ([place]) 2017  . SUI (stress urinary incontinence, female)     Patient Active Problem List   Diagnosis Date Noted  . Anxiety 02/20/2020  . B12 deficiency 02/20/2020  . Chronic back pain 02/20/2020  . Chronic vaginitis 02/20/2020  . Fibrocystic breast disease 02/20/2020    . History of colonic polyps 02/20/2020  . Migraine headache 02/20/2020  . Non-cardiac chest pain 02/20/2020  . Senile nuclear sclerosis 02/20/2020  . Sleep apnea 02/20/2020  . SUI (stress urinary incontinence, female) 02/20/2020  . Chronic pain syndrome 02/20/2020  . Pharmacologic therapy 02/20/2020  . Disorder of skeletal system 02/20/2020  . Problems influencing health status 02/20/2020  . LBBB (left bundle branch block) 12/21/2019  . Preop cardiovascular exam 12/21/2019  . Long-term insulin use ([place]) 11/16/2018  . Post-menopausal 11/16/2018  . Mixed hyperlipidemia 11/16/2018  . DM type 2 with diabetic peripheral neuropathy ([place]) 07/21/2018  . Hx of low back pain 02/28/2018  . Back pain without sciatica 02/01/2018  . History of compression fracture of spine 02/01/2018  . Loss of memory 02/01/2018  . Mid back pain on right side 11/09/2017  . Splenomegaly 11/09/2017  . Subarachnoid hemorrhage following injury, no loss of consciousness ([place]) 05/01/2017  . 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 Lightheadedness 04/26/2017  . Weakness 04/22/2017  . History of stroke 03/17/2017  . Dizziness 02/27/2017  . UTI (urinary tract infection) 02/27/2017  . GERD (gastroesophageal reflux disease) 02/27/2017  . HTN (hypertension) 02/27/2017  . HLD (hyperlipidemia) 02/27/2017  . Diabetes ([place]) 02/27/2017  . Type 2 diabetes mellitus with hyperglycemia, with long-term current use of insulin ([place]) 09/03/2016  . Dysphagia 05/10/2014  . FH: colon cancer 05/10/2014  .  Anemia 02/06/2014  . Vitamin D deficiency 02/06/2014  . Hyperlipidemia 02/06/2014  . Cataract 11/09/2013  . Depression 11/09/2013  . OAB (overactive bladder) 11/09/2013  . TIA (transient ischemic attack) 11/09/2013  . Type 2 diabetes mellitus without complications ([place]) 11/94/1740  . Gastro-esophageal reflux disease without esophagitis 11/09/2013  . Essential (primary) hypertension 11/09/2013  . Herpes zoster 03/30/2004    Past  Surgical History:  Procedure Laterality Date  . ABDOMINAL HYSTERECTOMY    . BREAST BIOPSY Right 1977   negative  . BREAST BIOPSY Right 1988   negative  . BREAST SURGERY    . CHOLECYSTECTOMY    . EYE SURGERY     cataract  . GALLBLADDER SURGERY    . SCALENOTOMY W/O RESECTION CERVICAL RIB    . XI ROBOTIC LAPAROSCOPIC ASSISTED APPENDECTOMY N/A 01/11/2020   Procedure: XI ROBOTIC LAPAROSCOPIC ASSISTED APPENDECTOMY;  Surgeon: Benjamine Sprague, DO;  Location: [facility] ORS;  Service: General;  Laterality: N/A;    Prior to Admission medications   Medication Sig Start Date End Date Taking? Authorizing Provider  Artificial Tear Solution (GENTEAL TEARS OP) Place 1 drop into both ears 2 (two) times daily as needed (dry eyes).    [provider]  aspirin EC 81 MG tablet Take 81 mg by mouth daily.     [provider]  atorvastatin (LIPITOR) 40 MG tablet Take 40 mg by mouth daily.    [provider]  buPROPion (WELLBUTRIN XL) 150 MG 24 hr tablet Take 1 tablet (150 mg total) by mouth daily. 04/25/17   Harvest Dark, MD  Cholecalciferol (VITAMIN D PO) Take 1,000 Units by mouth daily.     [provider]  clopidogrel (PLAVIX) 75 MG tablet Take 75 mg by mouth daily.  04/16/14   [provider]  cyanocobalamin 1000 MCG tablet Take 1,000 mcg by mouth daily.     [provider]  Dulaglutide (TRULICITY) 1.5 CX/4.4YJ SOPN Inject 1.5 mg into the skin daily.    [provider]  DULoxetine (CYMBALTA) 60 MG capsule Take 60 mg by mouth daily.  04/16/14   [provider]  HYDROcodone-acetaminophen (NORCO) 5-325 MG tablet Take 1 tablet by mouth every 6 (six) hours as needed for up to 6 doses for moderate pain. 01/11/20   Lysle Pearl, Isami, DO  ibuprofen (ADVIL) 800 MG tablet Take 1 tablet (800 mg total) by mouth every 8 (eight) hours as needed for mild pain or moderate pain. 01/11/20   Sakai, Isami, DO  insulin glargine (LANTUS) 100 unit/mL SOPN Inject 30 Units  into the skin at bedtime.     [provider]  lamoTRIgine (LAMICTAL) 100 MG tablet Take 1 tablet (100 mg total) by mouth daily. 04/25/17   Harvest Dark, MD  metoprolol succinate (TOPROL-XL) 25 MG 24 hr tablet Take 25 mg by mouth daily.  04/16/14   [provider]  ONE TOUCH ULTRA TEST test strip  03/12/14   [provider]  pantoprazole (PROTONIX) 40 MG tablet Take 40 mg by mouth daily.    [provider]  Polyethyl Glycol-Propyl Glycol (SYSTANE ULTRA OP) Place 1 drop into both eyes 2 (two) times daily as needed (dry eyes).    [provider]  traZODone (DESYREL) 50 MG tablet Take 50 mg by mouth at bedtime.    [provider]    Allergies Amoxicillin and Lidocaine  Family History  Problem Relation Age of Onset  . Colon cancer Father   . Kidney cancer Neg Hx   .  Bladder Cancer Neg Hx   . Breast cancer Neg Hx     Social History Social History   Tobacco Use  . Smoking status: Never Smoker  . Smokeless tobacco: Never Used  Substance Use Topics  . Alcohol use: Not on file    Comment: occ  . Drug use: No    Review of Systems  Review of Systems  Constitutional: Positive for appetite change and fatigue. Negative for chills and fever.  HENT: Negative for sore throat.   Respiratory: Negative for shortness of breath.   Cardiovascular: Negative for chest pain.  Gastrointestinal: Negative for abdominal pain.  Genitourinary: Negative for flank pain.  Musculoskeletal: Negative for neck pain.  Skin: Negative for rash and wound.  Allergic/Immunologic: Negative for immunocompromised state.  Neurological: Positive for weakness. Negative for numbness.  Hematological: Does not bruise/bleed easily.  Psychiatric/Behavioral: Positive for confusion.  All other systems reviewed and are negative.    ____________________________________________  PHYSICAL EXAM:      VITAL SIGNS: ED Triage Vitals  Enc Vitals Group     BP 03/15/20  1611 (!) 145/77     Pulse Rate 03/15/20 1611 89     Resp 03/15/20 1611 18     Temp 03/15/20 1608 98.2 F (36.8 C)     Temp Source 03/15/20 1608 Oral     SpO2 03/15/20 1611 97 %     Weight 03/15/20 1612 146 lb (66.2 kg)     Height 03/15/20 1612 5\' 4"  (1.626 m)     Head Circumference --      Peak Flow --      Pain Score 03/15/20 1611 0     Pain Loc --      Pain Edu? --      Excl. in [place]? --      Physical Exam Vitals and nursing note reviewed.  Constitutional:      General: She is not in acute distress.    Appearance: She is well-developed.  HENT:     Head: Normocephalic and atraumatic.  Eyes:     Conjunctiva/sclera: Conjunctivae normal.  Cardiovascular:     Rate and Rhythm: Normal rate and regular rhythm.     Heart sounds: Normal heart sounds. No murmur heard.  No friction rub.  Pulmonary:     Effort: Pulmonary effort is normal. No respiratory distress.     Breath sounds: Normal breath sounds. No wheezing or rales.  Abdominal:     General: There is no distension.     Palpations: Abdomen is soft.     Tenderness: There is no abdominal tenderness.  Musculoskeletal:     Cervical back: Neck supple.  Skin:    General: Skin is warm.     Capillary Refill: Capillary refill takes less than 2 seconds.  Neurological:     Mental Status: She is alert and oriented to person, place, and time.     Motor: No abnormal muscle tone.     Comments: Neurological Exam:  Mental Status: Alert and oriented to person, place, and time. Attention and concentration normal. Speech clear. Recent memory is intact. Cranial Nerves: Visual fields grossly intact. EOMI and PERRLA. No nystagmus noted. Facial sensation intact at forehead, maxillary cheek, and chin/mandible bilaterally. No facial asymmetry or weakness. Hearing grossly normal. Uvula is midline, and palate elevates symmetrically. Normal SCM and trapezius strength. Tongue midline without fasciculations. Motor: Muscle strength 5/5 in proximal and  distal UE and LE bilaterally. No pronator drift. Muscle tone normal.  Sensation: Intact to  light touch in upper and lower extremities distally bilaterally.  Gait: Normal without ataxia. Coordination: Normal FTN bilaterally.          ____________________________________________   LABS (all labs ordered are listed, but only abnormal results are displayed)  Labs Reviewed  CBC - Abnormal; Notable for the following components:      Result Value   WBC 10.6 (*)    RBC 6.21 (*)    Hemoglobin 15.6 (*)    HCT 50.0 (*)    MCH 25.1 (*)    RDW 17.5 (*)    Platelets 526 (*)    All other components within normal limits  COMPREHENSIVE METABOLIC PANEL - Abnormal; Notable for the following components:   Glucose, Bld 145 (*)    GFR calc non Af Amer 56 (*)    All other components within normal limits  GLUCOSE, CAPILLARY - Abnormal; Notable for the following components:   Glucose-Capillary 134 (*)    All other components within normal limits  PROTIME-INR  APTT  DIFFERENTIAL  CBG MONITORING, ED    ____________________________________________  EKG: Normal sinus rhythm, ventricular rate 85.  PR 140, QRS 124, QTc 43.  Left bundle branch block.  No acute ST elevations or depressions. ________________________________________  RADIOLOGY All imaging, including plain films, CT scans, and ultrasounds, independently reviewed by me, and interpretations confirmed via formal radiology reads.  ED MD interpretation:   CT head: Negative  Official radiology report(s): CT HEAD WO CONTRAST  Result Date: 03/15/2020 CLINICAL DATA:  Possible stroke. Delirium. EXAM: CT HEAD WITHOUT CONTRAST TECHNIQUE: Contiguous axial images were obtained from the base of the skull through the vertex without intravenous contrast. COMPARISON:  June 10, 2017 FINDINGS: Brain: No evidence of acute infarction, hemorrhage, hydrocephalus, extra-axial collection or mass lesion/mass effect. Moderate deep white matter  microangiopathy. Vascular: Calcific atherosclerotic disease of the intra cavernous carotid arteries and vertebral arteries. Skull: Normal. Negative for fracture or focal lesion. Sinuses/Orbits: No acute finding. Other: None. IMPRESSION: 1. No acute intracranial abnormality. 2. Moderate deep white matter microangiopathy. Electronically Signed   By: Fidela Salisbury M.D.   On: 03/15/2020 16:46    ____________________________________________  PROCEDURES   Procedure(s) performed (including Critical Care):  Procedures  ____________________________________________  INITIAL IMPRESSION / MDM / [place] / ED COURSE  As part of my medical decision making, I reviewed the following data within the [facility] notes reviewed and incorporated, Old chart reviewed, Notes from prior ED visits, and [place] Controlled Substance Database       *JERSI MCMASTER was evaluated in Emergency Department on 03/15/2020 for the symptoms described in the history of present illness. She was evaluated in the context of the global COVID-19 pandemic, which necessitated consideration that the patient might be at risk for infection with the SARS-CoV-2 virus that causes COVID-19. Institutional protocols and algorithms that pertain to the evaluation of patients at risk for COVID-19 are in a state of rapid change based on information released by regulatory bodies including the CDC and federal and state organizations. These policies and algorithms were followed during the patient's care in the ED.  Some ED evaluations and interventions may be delayed as a result of limited staffing during the pandemic.*     Medical Decision Making: 79 female here with transient altered mental status, now resolved.  Of note, she tends to have these episodes when she does not eat or drink much, and she is on a long-acting, injectable insulin.  Suspect transient hypoglycemic  episodes.  She admits to very poor p.o.  intake and her symptoms generally improve once she eats.  She had a prodrome of shakiness, sweatiness, consistent with this.  No chest pain, shortness of breath, EKG is nonischemic, I do not suspect cardiac etiology.  She has had no focal neurological deficits including no focal weakness, numbness, and she is back to her mental baseline.  CT head is negative.  Screening lab work is reassuring.  No evidence of AKI or other symptoms that would suggest metabolic encephalopathy.  She feels improved in the ED.  Will discharge with instructions to eat regular meals, have frequent snacks, and follow-up with her PCP.  ____________________________________________  FINAL CLINICAL IMPRESSION(S) / ED DIAGNOSES  Final diagnoses:  Hypoglycemia     MEDICATIONS GIVEN DURING THIS VISIT:  Medications  acetaminophen (TYLENOL) tablet 650 mg (650 mg Oral Given 03/15/20 1717)     ED Discharge Orders    None       Note:  This document was prepared using Dragon voice recognition software and may include unintentional dictation errors.   Duffy Bruce, MD 03/15/20 2023

## 2020-03-15 NOTE — ED Notes (Signed)
md remains at bedside after entering pt room at [place].

## 2020-03-15 NOTE — ED Triage Notes (Signed)
Pt arrived via [facility], tested UTI a while back, playing cards with friends when pt reported to EMS that she was having "brain farts"  CBG -149 P-83 12 lead LBBB 98% RA 152/80  Per  EMS c/o L leg pain with  hx of sciatica   A/O x4, doesn't want sister to know she is in the hospital because her sister told her not to come back to the hospital. Per EMS pt talkative on ride over.   20G R Hand placed prior to arrival.

## 2020-03-15 NOTE — ED Triage Notes (Signed)
See first nurse note. Pt reports not feeling well lately. Pt reports symptoms started 3-4 days ago.

## 2020-03-15 NOTE — ED Notes (Signed)
Pt up to registration desk arguing about eating, pt was advised by several people that she cannot eat at this time until she is seen by the provider, pt continues to argue with staff saying she hasn't eaten for 15 hours and that she has been here for 6 hours. Advised patient that she has not been here for 6 hours.  Pt has friend outside that patient who is steady at this time however, continues to want to get up and go outside.  At this time, I informed the ED Lobby screener to allow the patient's friend to come in because the patient appears to have some confusion.  Pt is worried about her blood sugar and has been offered by multiple staff members to re-check her sugar even though her blood sugar on her labs were normal, pt declined each time.

## 2020-04-04 DIAGNOSIS — D751 Secondary polycythemia: Secondary | ICD-10-CM | POA: Insufficient documentation

## 2020-04-04 NOTE — Progress Notes (Signed)
[place]  [phone] [phone]  ID: Kristen Guerra OB: 26-Jan-1941  MR#: [id number]  YYQ#:[id number]  Patient Care Team: Idelle Crouch, MD as PCP - General (Internal Medicine)  CHIEF COMPLAINT: Polycythemia.  INTERVAL HISTORY: Patient is a 79 year old female who was noted to have an increased hemoglobin on routine blood work.  She is anxious, but otherwise feels well.  She has no neurologic complaints.  She denies any recent fevers or illnesses.  She has a good appetite and denies weight loss.  She has no chest pain, shortness of breath, cough, or hemoptysis.  She denies any nausea, vomiting, constipation, or diarrhea.  She has no urinary complaints.  Patient feels at her baseline offers no further specific complaints today.  REVIEW OF SYSTEMS:   Review of Systems  Constitutional: Negative.  Negative for fever, malaise/fatigue and weight loss.  Respiratory: Negative.  Negative for cough, hemoptysis and shortness of breath.   Cardiovascular: Negative.  Negative for chest pain and leg swelling.  Gastrointestinal: Negative.  Negative for abdominal pain.  Genitourinary: Negative.  Negative for dysuria.  Musculoskeletal: Negative.  Negative for back pain.  Skin: Negative.  Negative for rash.  Neurological: Negative.  Negative for dizziness, focal weakness, weakness and headaches.  Psychiatric/Behavioral: The patient is nervous/anxious.     As per HPI. Otherwise, a complete review of systems is negative.  PAST MEDICAL HISTORY: Past Medical History:  Diagnosis Date   Anxiety    Arthritis    Complication of anesthesia 2015   had trouble waking up after dental work   Depression    Diabetes mellitus without complication (HCC)    GERD (gastroesophageal reflux disease)    High cholesterol    Hypertension    PSVT (paroxysmal supraventricular tachycardia) ([place])    Sleep apnea    Stroke ([place]) 2017   SUI (stress urinary  incontinence, female)     PAST SURGICAL HISTORY: Past Surgical History:  Procedure Laterality Date   ABDOMINAL HYSTERECTOMY     BREAST BIOPSY Right 1977   negative   BREAST BIOPSY Right 1988   negative   BREAST SURGERY     CHOLECYSTECTOMY     EYE SURGERY     cataract   GALLBLADDER SURGERY     SCALENOTOMY W/O RESECTION CERVICAL RIB     XI ROBOTIC LAPAROSCOPIC ASSISTED APPENDECTOMY N/A 01/11/2020   Procedure: XI ROBOTIC LAPAROSCOPIC ASSISTED APPENDECTOMY;  Surgeon: Benjamine Sprague, DO;  Location: [facility] ORS;  Service: General;  Laterality: N/A;    FAMILY HISTORY: Family History  Problem Relation Age of Onset   Colon cancer Father    Kidney cancer Neg Hx    Bladder Cancer Neg Hx    Breast cancer Neg Hx     ADVANCED DIRECTIVES (Y/N):  N  HEALTH MAINTENANCE: Social History   Tobacco Use   Smoking status: Never Smoker   Smokeless tobacco: Never Used  Substance Use Topics   Alcohol use: Not on file    Comment: occ   Drug use: No     Colonoscopy:  PAP:  Bone density:  Lipid panel:  Allergies  Allergen Reactions   Amoxicillin Itching and Other (See Comments)    Reaction: dizziness Has patient had a PCN reaction causing immediate rash, facial/tongue/throat swelling, SOB or lightheadedness with hypotension: No Has patient had a PCN reaction causing severe rash involving mucus membranes or skin necrosis: No Has patient had a PCN reaction that required hospitalization: No Has patient had a PCN reaction occurring  within the last 10 years: Yes If all of the above answers are "NO", then may proceed with Cephalosporin use.    Lidocaine Itching    Current Outpatient Medications  Medication Sig Dispense Refill   Artificial Tear Solution (GENTEAL TEARS OP) Place 1 drop into both ears 2 (two) times daily as needed (dry eyes).     atorvastatin (LIPITOR) 40 MG tablet Take 40 mg by mouth daily.     buPROPion (WELLBUTRIN XL) 150 MG 24 hr tablet Take 1 tablet  (150 mg total) by mouth daily. 30 tablet 0   Cholecalciferol (VITAMIN D PO) Take 1,000 Units by mouth daily.      clopidogrel (PLAVIX) 75 MG tablet Take 75 mg by mouth daily.      cyanocobalamin 1000 MCG tablet Take 1,000 mcg by mouth daily.      Dulaglutide (TRULICITY) 1.5 MG/0.5ML SOPN Inject 1.5 mg into the skin daily.     DULoxetine (CYMBALTA) 60 MG capsule Take 60 mg by mouth daily.      HYDROcodone-acetaminophen (NORCO) 5-325 MG tablet Take 1 tablet by mouth every 6 (six) hours as needed for up to 6 doses for moderate pain. 6 tablet 0   ibuprofen (ADVIL) 800 MG tablet Take 1 tablet (800 mg total) by mouth every 8 (eight) hours as needed for mild pain or moderate pain. 30 tablet 0   insulin glargine (LANTUS) 100 unit/mL SOPN Inject 30 Units into the skin at bedtime.      lamoTRIgine (LAMICTAL) 100 MG tablet Take 1 tablet (100 mg total) by mouth daily. 30 tablet 0   metoprolol succinate (TOPROL-XL) 25 MG 24 hr tablet Take 25 mg by mouth daily.      ONE TOUCH ULTRA TEST test strip      pantoprazole (PROTONIX) 40 MG tablet Take 40 mg by mouth daily.     Polyethyl Glycol-Propyl Glycol (SYSTANE ULTRA OP) Place 1 drop into both eyes 2 (two) times daily as needed (dry eyes).     traZODone (DESYREL) 50 MG tablet Take 50 mg by mouth at bedtime.     aspirin EC 81 MG tablet Take 81 mg by mouth daily.  (Patient not taking: Reported on 04/07/2020)     No current facility-administered medications for this visit.   Facility-Administered Medications Ordered in Other Visits  Medication Dose Route Frequency Provider Last Rate Last Admin   Chlorhexidine Gluconate Cloth 2 % PADS 6 each  6 each Topical Once Golva, Isami, DO        OBJECTIVE: There were no vitals filed for this visit.   There is no height or weight on file to calculate BMI.    ECOG FS:0 - Asymptomatic  General: Well-developed, well-nourished, no acute distress. Eyes: Pink conjunctiva, anicteric sclera. HEENT: Normocephalic,  moist mucous membranes. Lungs: No audible wheezing or coughing. Heart: Regular rate and rhythm. Abdomen: Soft, nontender, no obvious distention. Musculoskeletal: No edema, cyanosis, or clubbing. Neuro: Alert, answering all questions appropriately. Cranial nerves grossly intact. Skin: No rashes or petechiae noted. Psych: Normal affect. Lymphatics: No cervical, calvicular, axillary or inguinal LAD.   LAB RESULTS:  Lab Results  Component Value Date   NA 139 03/15/2020   K 3.5 03/15/2020   CL 107 03/15/2020   CO2 23 03/15/2020   GLUCOSE 145 (H) 03/15/2020   BUN 16 03/15/2020   CREATININE 0.97 03/15/2020   CALCIUM 9.7 03/15/2020   PROT 7.4 03/15/2020   ALBUMIN 4.3 03/15/2020   AST 18 03/15/2020   ALT 14  03/15/2020   ALKPHOS 49 03/15/2020   BILITOT 0.6 03/15/2020   GFRNONAA 56 (L) 03/15/2020   GFRAA >60 03/15/2020    Lab Results  Component Value Date   WBC 13.5 (H) 04/07/2020   NEUTROABS 7.6 03/15/2020   HGB 16.3 (H) 04/07/2020   HCT 52.2 (H) 04/07/2020   MCV 80.9 04/07/2020   PLT 547 (H) 04/07/2020     STUDIES: CT HEAD WO CONTRAST  Result Date: 03/15/2020 CLINICAL DATA:  Possible stroke. Delirium. EXAM: CT HEAD WITHOUT CONTRAST TECHNIQUE: Contiguous axial images were obtained from the base of the skull through the vertex without intravenous contrast. COMPARISON:  June 10, 2017 FINDINGS: Brain: No evidence of acute infarction, hemorrhage, hydrocephalus, extra-axial collection or mass lesion/mass effect. Moderate deep white matter microangiopathy. Vascular: Calcific atherosclerotic disease of the intra cavernous carotid arteries and vertebral arteries. Skull: Normal. Negative for fracture or focal lesion. Sinuses/Orbits: No acute finding. Other: None. IMPRESSION: 1. No acute intracranial abnormality. 2. Moderate deep white matter microangiopathy. Electronically Signed   By: Fidela Salisbury M.D.   On: 03/15/2020 16:46    ASSESSMENT: Polycythemia.  PLAN:    1.  Polycythemia: Patient's hemoglobin remains mildly elevated at 16.3, but essentially unchanged. She is also noted to have a mildly elevated white blood cell count as well as thrombocytosis.  Iron stores are within normal limits.  The remainder of her laboratory work including JAK2 mutation is pending at time of dictation.  No intervention is needed at this time.  Patient does not require bone marrow biopsy.  Return to clinic in 3 weeks for further evaluation and discussion of her laboratory results. 2.  Thrombocytosis: JAK2 mutation as above. 3.  Leukocytosis: If patient's white blood cell count remains elevated, will order additional laboratory work for further evaluation.  Monitor.  Patient expressed understanding and was in agreement with this plan. She also understands that She can call clinic at any time with any questions, concerns, or complaints.     Lloyd Huger, MD   04/08/2020 6:42 AM

## 2020-04-07 ENCOUNTER — Inpatient Hospital Stay: Payer: Medicare Other

## 2020-04-07 ENCOUNTER — Other Ambulatory Visit: Payer: Self-pay

## 2020-04-07 ENCOUNTER — Inpatient Hospital Stay: Payer: Medicare Other | Attending: Oncology | Admitting: Oncology

## 2020-04-07 DIAGNOSIS — D473 Essential (hemorrhagic) thrombocythemia: Secondary | ICD-10-CM | POA: Diagnosis not present

## 2020-04-07 DIAGNOSIS — D72829 Elevated white blood cell count, unspecified: Secondary | ICD-10-CM | POA: Insufficient documentation

## 2020-04-07 DIAGNOSIS — Z800 Family history of malignant neoplasm of digestive organs: Secondary | ICD-10-CM | POA: Insufficient documentation

## 2020-04-07 DIAGNOSIS — D751 Secondary polycythemia: Secondary | ICD-10-CM

## 2020-04-07 LAB — FERRITIN: Ferritin: 15 ng/mL (ref 11–307)

## 2020-04-07 LAB — CBC
HCT: 52.2 % — ABNORMAL HIGH (ref 36.0–46.0)
Hemoglobin: 16.3 g/dL — ABNORMAL HIGH (ref 12.0–15.0)
MCH: 25.3 pg — ABNORMAL LOW (ref 26.0–34.0)
MCHC: 31.2 g/dL (ref 30.0–36.0)
MCV: 80.9 fL (ref 80.0–100.0)
Platelets: 547 10*3/uL — ABNORMAL HIGH (ref 150–400)
RBC: 6.45 MIL/uL — ABNORMAL HIGH (ref 3.87–5.11)
RDW: 17.8 % — ABNORMAL HIGH (ref 11.5–15.5)
WBC: 13.5 10*3/uL — ABNORMAL HIGH (ref 4.0–10.5)
nRBC: 0 % (ref 0.0–0.2)

## 2020-04-07 LAB — IRON AND TIBC
Iron: 51 ug/dL (ref 28–170)
Saturation Ratios: 12 % (ref 10.4–31.8)
TIBC: 444 ug/dL (ref 250–450)
UIBC: 393 ug/dL

## 2020-04-08 LAB — CARBON MONOXIDE, BLOOD (PERFORMED AT REF LAB): Carbon Monoxide, Blood: 2.3 % (ref 0.0–3.6)

## 2020-04-08 LAB — ERYTHROPOIETIN: Erythropoietin: 1.8 m[IU]/mL — ABNORMAL LOW (ref 2.6–18.5)

## 2020-04-10 LAB — JAK2 GENOTYPR

## 2020-04-15 LAB — HEMOCHROMATOSIS DNA-PCR(C282Y,H63D)

## 2020-04-23 ENCOUNTER — Ambulatory Visit: Payer: Self-pay | Admitting: Licensed Clinical Social Worker

## 2020-04-25 ENCOUNTER — Other Ambulatory Visit: Payer: Self-pay | Admitting: Sports Medicine

## 2020-04-25 ENCOUNTER — Encounter: Payer: Self-pay | Admitting: Oncology

## 2020-04-25 DIAGNOSIS — G8929 Other chronic pain: Secondary | ICD-10-CM

## 2020-04-25 NOTE — Progress Notes (Signed)
Patient states she is having very bad pain in left leg. She rates pain at 8. She states she has not been getting a lot of sleep due to pain in leg and sudden death of her sister. She would like to discuss with provider how long this pain is going to last.

## 2020-04-26 NOTE — Progress Notes (Signed)
  [place]  [phone] [phone]) [phone]  ID: Dahlia Byes OB: June 06, 1941  MR#: [id number]  GXQ#:[id number]  Patient Care Team: Idelle Crouch, MD as PCP - General (Internal Medicine)     Lloyd Huger, MD   04/29/2020 8:32 AM     This encounter was created in error - please disregard.

## 2020-04-28 ENCOUNTER — Inpatient Hospital Stay: Payer: Medicare Other | Admitting: Oncology

## 2020-05-31 ENCOUNTER — Emergency Department: Payer: Medicare Other

## 2020-05-31 ENCOUNTER — Inpatient Hospital Stay
Admission: EM | Admit: 2020-05-31 | Discharge: 2020-06-03 | DRG: 689 | Disposition: A | Discharge status: Skilled Nursing Facility | Payer: Medicare Other | Attending: Family Medicine | Admitting: Family Medicine

## 2020-05-31 ENCOUNTER — Other Ambulatory Visit: Payer: Self-pay

## 2020-05-31 DIAGNOSIS — G928 Other toxic encephalopathy: Secondary | ICD-10-CM | POA: Diagnosis present

## 2020-05-31 DIAGNOSIS — E538 Deficiency of other specified B group vitamins: Secondary | ICD-10-CM | POA: Diagnosis present

## 2020-05-31 DIAGNOSIS — G9341 Metabolic encephalopathy: Secondary | ICD-10-CM

## 2020-05-31 DIAGNOSIS — R4182 Altered mental status, unspecified: Secondary | ICD-10-CM | POA: Diagnosis not present

## 2020-05-31 DIAGNOSIS — N390 Urinary tract infection, site not specified: Secondary | ICD-10-CM | POA: Diagnosis not present

## 2020-05-31 DIAGNOSIS — F0281 Dementia in other diseases classified elsewhere with behavioral disturbance: Secondary | ICD-10-CM | POA: Diagnosis present

## 2020-05-31 DIAGNOSIS — Z888 Allergy status to other drugs, medicaments and biological substances status: Secondary | ICD-10-CM

## 2020-05-31 DIAGNOSIS — Z90710 Acquired absence of both cervix and uterus: Secondary | ICD-10-CM

## 2020-05-31 DIAGNOSIS — E782 Mixed hyperlipidemia: Secondary | ICD-10-CM | POA: Diagnosis not present

## 2020-05-31 DIAGNOSIS — B9620 Unspecified Escherichia coli [E. coli] as the cause of diseases classified elsewhere: Secondary | ICD-10-CM | POA: Diagnosis present

## 2020-05-31 DIAGNOSIS — Z20822 Contact with and (suspected) exposure to covid-19: Secondary | ICD-10-CM | POA: Diagnosis present

## 2020-05-31 DIAGNOSIS — R410 Disorientation, unspecified: Secondary | ICD-10-CM | POA: Diagnosis not present

## 2020-05-31 DIAGNOSIS — M1990 Unspecified osteoarthritis, unspecified site: Secondary | ICD-10-CM | POA: Diagnosis present

## 2020-05-31 DIAGNOSIS — E119 Type 2 diabetes mellitus without complications: Secondary | ICD-10-CM

## 2020-05-31 DIAGNOSIS — G4730 Sleep apnea, unspecified: Secondary | ICD-10-CM | POA: Diagnosis present

## 2020-05-31 DIAGNOSIS — I10 Essential (primary) hypertension: Secondary | ICD-10-CM | POA: Diagnosis present

## 2020-05-31 DIAGNOSIS — Z8249 Family history of ischemic heart disease and other diseases of the circulatory system: Secondary | ICD-10-CM

## 2020-05-31 DIAGNOSIS — R5381 Other malaise: Secondary | ICD-10-CM | POA: Diagnosis present

## 2020-05-31 DIAGNOSIS — Z7902 Long term (current) use of antithrombotics/antiplatelets: Secondary | ICD-10-CM

## 2020-05-31 DIAGNOSIS — K219 Gastro-esophageal reflux disease without esophagitis: Secondary | ICD-10-CM | POA: Diagnosis present

## 2020-05-31 DIAGNOSIS — F32A Depression, unspecified: Secondary | ICD-10-CM | POA: Diagnosis present

## 2020-05-31 DIAGNOSIS — N3090 Cystitis, unspecified without hematuria: Secondary | ICD-10-CM | POA: Diagnosis not present

## 2020-05-31 DIAGNOSIS — Z880 Allergy status to penicillin: Secondary | ICD-10-CM

## 2020-05-31 DIAGNOSIS — E7800 Pure hypercholesterolemia, unspecified: Secondary | ICD-10-CM | POA: Diagnosis present

## 2020-05-31 DIAGNOSIS — Z800 Family history of malignant neoplasm of digestive organs: Secondary | ICD-10-CM | POA: Diagnosis not present

## 2020-05-31 DIAGNOSIS — G20 Parkinson's disease: Secondary | ICD-10-CM | POA: Diagnosis present

## 2020-05-31 DIAGNOSIS — E1142 Type 2 diabetes mellitus with diabetic polyneuropathy: Secondary | ICD-10-CM | POA: Diagnosis present

## 2020-05-31 DIAGNOSIS — Z8673 Personal history of transient ischemic attack (TIA), and cerebral infarction without residual deficits: Secondary | ICD-10-CM | POA: Diagnosis not present

## 2020-05-31 DIAGNOSIS — I447 Left bundle-branch block, unspecified: Secondary | ICD-10-CM | POA: Diagnosis present

## 2020-05-31 DIAGNOSIS — K2100 Gastro-esophageal reflux disease with esophagitis, without bleeding: Secondary | ICD-10-CM | POA: Diagnosis not present

## 2020-05-31 DIAGNOSIS — Z794 Long term (current) use of insulin: Secondary | ICD-10-CM

## 2020-05-31 DIAGNOSIS — Z79899 Other long term (current) drug therapy: Secondary | ICD-10-CM | POA: Diagnosis not present

## 2020-05-31 DIAGNOSIS — N3000 Acute cystitis without hematuria: Secondary | ICD-10-CM | POA: Diagnosis not present

## 2020-05-31 DIAGNOSIS — E785 Hyperlipidemia, unspecified: Secondary | ICD-10-CM | POA: Diagnosis present

## 2020-05-31 DIAGNOSIS — Z9049 Acquired absence of other specified parts of digestive tract: Secondary | ICD-10-CM | POA: Diagnosis not present

## 2020-05-31 DIAGNOSIS — Z7982 Long term (current) use of aspirin: Secondary | ICD-10-CM

## 2020-05-31 LAB — URINE DRUG SCREEN, QUALITATIVE (ARMC ONLY)
Amphetamines, Ur Screen: NOT DETECTED
Barbiturates, Ur Screen: NOT DETECTED
Benzodiazepine, Ur Scrn: NOT DETECTED
Cannabinoid 50 Ng, Ur ~~LOC~~: NOT DETECTED
Cocaine Metabolite,Ur ~~LOC~~: NOT DETECTED
MDMA (Ecstasy)Ur Screen: NOT DETECTED
Methadone Scn, Ur: NOT DETECTED
Opiate, Ur Screen: NOT DETECTED
Phencyclidine (PCP) Ur S: NOT DETECTED
Tricyclic, Ur Screen: NOT DETECTED

## 2020-05-31 LAB — COMPREHENSIVE METABOLIC PANEL
ALT: 15 U/L (ref 0–44)
AST: 19 U/L (ref 15–41)
Albumin: 3.9 g/dL (ref 3.5–5.0)
Alkaline Phosphatase: 56 U/L (ref 38–126)
Anion gap: 6 (ref 5–15)
BUN: 15 mg/dL (ref 8–23)
CO2: 30 mmol/L (ref 22–32)
Calcium: 9.8 mg/dL (ref 8.9–10.3)
Chloride: 100 mmol/L (ref 98–111)
Creatinine, Ser: 0.91 mg/dL (ref 0.44–1.00)
GFR calc Af Amer: >60 mL/min (ref >60)
GFR calc non Af Amer: 60 mL/min — ABNORMAL LOW (ref >60)
Glucose, Bld: 153 mg/dL — ABNORMAL HIGH (ref 70–99)
Potassium: 3.6 mmol/L (ref 3.5–5.1)
Sodium: 136 mmol/L (ref 135–145)
Total Bilirubin: 0.6 mg/dL (ref 0.3–1.2)
Total Protein: 6.6 g/dL (ref 6.5–8.1)

## 2020-05-31 LAB — CBC WITH DIFFERENTIAL/PLATELET
Abs Immature Granulocytes: 0.06 10*3/uL (ref 0.00–0.07)
Basophils Absolute: 0.1 10*3/uL (ref 0.0–0.1)
Basophils Relative: 1 %
Eosinophils Absolute: 0.3 10*3/uL (ref 0.0–0.5)
Eosinophils Relative: 2 %
HCT: 48.6 % — ABNORMAL HIGH (ref 36.0–46.0)
Hemoglobin: 15.1 g/dL — ABNORMAL HIGH (ref 12.0–15.0)
Immature Granulocytes: 1 %
Lymphocytes Relative: 20 %
Lymphs Abs: 2.2 10*3/uL (ref 0.7–4.0)
MCH: 26 pg (ref 26.0–34.0)
MCHC: 31.1 g/dL (ref 30.0–36.0)
MCV: 83.6 fL (ref 80.0–100.0)
Monocytes Absolute: 0.7 10*3/uL (ref 0.1–1.0)
Monocytes Relative: 7 %
Neutro Abs: 7.5 10*3/uL (ref 1.7–7.7)
Neutrophils Relative %: 69 %
Platelets: 487 10*3/uL — ABNORMAL HIGH (ref 150–400)
RBC: 5.81 MIL/uL — ABNORMAL HIGH (ref 3.87–5.11)
RDW: 16.1 % — ABNORMAL HIGH (ref 11.5–15.5)
WBC: 10.8 10*3/uL — ABNORMAL HIGH (ref 4.0–10.5)
nRBC: 0 % (ref 0.0–0.2)

## 2020-05-31 LAB — URINALYSIS, COMPLETE (UACMP) WITH MICROSCOPIC
Bilirubin Urine: NEGATIVE
Glucose, UA: >=500 mg/dL — AB
Hgb urine dipstick: NEGATIVE
Ketones, ur: NEGATIVE mg/dL
Nitrite: POSITIVE — AB
Protein, ur: 30 mg/dL — AB
Specific Gravity, Urine: 1.021 (ref 1.005–1.030)
Squamous Epithelial / HPF: NONE SEEN (ref 0–5)
pH: 5 (ref 5.0–8.0)

## 2020-05-31 LAB — RESPIRATORY PANEL BY RT PCR (FLU A&B, COVID)
Influenza A by PCR: NEGATIVE
Influenza B by PCR: NEGATIVE
SARS Coronavirus 2 by RT PCR: NEGATIVE

## 2020-05-31 LAB — TSH: TSH: 1.696 u[IU]/mL (ref 0.350–4.500)

## 2020-05-31 LAB — ETHANOL: Alcohol, Ethyl (B): <10 mg/dL (ref <10)

## 2020-05-31 LAB — AMMONIA: Ammonia: 19 umol/L (ref 9–35)

## 2020-05-31 MED ORDER — ATORVASTATIN CALCIUM 20 MG PO TABS
40.0000 mg | ORAL_TABLET | Freq: Every day | ORAL | Status: DC
  Administered 2020-05-31 – 2020-06-03 (×4): 40 mg via ORAL
  Filled 2020-05-31 (×4): qty 2

## 2020-05-31 MED ORDER — TRAZODONE HCL 50 MG PO TABS: 25.0000 mg | ORAL_TABLET | Freq: Every evening | ORAL | Status: DC | PRN

## 2020-05-31 MED ORDER — TRAZODONE HCL 50 MG PO TABS
50.0000 mg | ORAL_TABLET | Freq: Every day | ORAL | Status: DC
  Administered 2020-05-31 – 2020-06-02 (×3): 50 mg via ORAL
  Filled 2020-05-31 (×3): qty 1

## 2020-05-31 MED ORDER — VITAMIN B-12 1000 MCG PO TABS
1000.0000 ug | ORAL_TABLET | Freq: Every day | ORAL | Status: DC
  Administered 2020-06-01 – 2020-06-03 (×3): 1000 ug via ORAL
  Filled 2020-05-31 (×3): qty 1

## 2020-05-31 MED ORDER — ASPIRIN EC 81 MG PO TBEC
81.0000 mg | DELAYED_RELEASE_TABLET | Freq: Every day | ORAL | Status: DC
  Administered 2020-05-31 – 2020-06-03 (×4): 81 mg via ORAL
  Filled 2020-05-31 (×4): qty 1

## 2020-05-31 MED ORDER — INSULIN GLARGINE 100 UNIT/ML ~~LOC~~ SOLN
30.0000 [IU] | Freq: Every day | SUBCUTANEOUS | Status: DC
  Administered 2020-06-01 – 2020-06-02 (×2): 30 [IU] via SUBCUTANEOUS
  Filled 2020-05-31 (×3): qty 0.3

## 2020-05-31 MED ORDER — LABETALOL HCL 5 MG/ML IV SOLN
20.0000 mg | INTRAVENOUS | Status: DC | PRN
  Administered 2020-06-01: 20 mg via INTRAVENOUS
  Filled 2020-05-31: qty 4

## 2020-05-31 MED ORDER — HYDROCODONE-ACETAMINOPHEN 5-325 MG PO TABS
1.0000 | ORAL_TABLET | Freq: Four times a day (QID) | ORAL | Status: DC | PRN
  Administered 2020-06-01 – 2020-06-03 (×3): 1 via ORAL
  Filled 2020-05-31 (×3): qty 1

## 2020-05-31 MED ORDER — POLYETHYL GLYCOL-PROPYL GLYCOL 0.4-0.3 % OP SOLN: Freq: Two times a day (BID) | OPHTHALMIC | Status: DC | PRN

## 2020-05-31 MED ORDER — CLOPIDOGREL BISULFATE 75 MG PO TABS
75.0000 mg | ORAL_TABLET | Freq: Every day | ORAL | Status: DC
  Administered 2020-06-01 – 2020-06-03 (×3): 75 mg via ORAL
  Filled 2020-05-31 (×3): qty 1

## 2020-05-31 MED ORDER — PANTOPRAZOLE SODIUM 40 MG PO TBEC
40.0000 mg | DELAYED_RELEASE_TABLET | Freq: Every day | ORAL | Status: DC
  Administered 2020-06-01 – 2020-06-03 (×3): 40 mg via ORAL
  Filled 2020-05-31 (×3): qty 1

## 2020-05-31 MED ORDER — BUPROPION HCL ER (XL) 150 MG PO TB24: 150.0000 mg | ORAL_TABLET | Freq: Every day | ORAL | Status: DC

## 2020-05-31 MED ORDER — ACETAMINOPHEN 325 MG PO TABS: 650.0000 mg | ORAL_TABLET | Freq: Four times a day (QID) | ORAL | Status: DC | PRN

## 2020-05-31 MED ORDER — SODIUM CHLORIDE 0.9 % IV SOLN
1.0000 g | INTRAVENOUS | Status: DC
  Administered 2020-06-01 – 2020-06-02 (×2): 1 g via INTRAVENOUS
  Filled 2020-05-31: qty 1
  Filled 2020-05-31 (×2): qty 10

## 2020-05-31 MED ORDER — HALOPERIDOL LACTATE 5 MG/ML IJ SOLN
2.0000 mg | Freq: Once | INTRAMUSCULAR | Status: AC
  Administered 2020-05-31: 2 mg via INTRAVENOUS
  Filled 2020-05-31: qty 1

## 2020-05-31 MED ORDER — VITAMIN D 25 MCG (1000 UNIT) PO TABS
1000.0000 [IU] | ORAL_TABLET | Freq: Every day | ORAL | Status: DC
  Administered 2020-06-01 – 2020-06-03 (×3): 1000 [IU] via ORAL
  Filled 2020-05-31 (×3): qty 1

## 2020-05-31 MED ORDER — METOPROLOL SUCCINATE ER 25 MG PO TB24
25.0000 mg | ORAL_TABLET | Freq: Every day | ORAL | Status: DC
  Administered 2020-05-31 – 2020-06-03 (×3): 25 mg via ORAL
  Filled 2020-05-31 (×4): qty 1

## 2020-05-31 MED ORDER — DULOXETINE HCL 60 MG PO CPEP: 60.0000 mg | ORAL_CAPSULE | Freq: Every day | ORAL | Status: DC

## 2020-05-31 MED ORDER — LAMOTRIGINE 100 MG PO TABS
100.0000 mg | ORAL_TABLET | Freq: Every day | ORAL | Status: DC
  Administered 2020-06-01 – 2020-06-03 (×3): 100 mg via ORAL
  Filled 2020-05-31 (×3): qty 1

## 2020-05-31 MED ORDER — BUPROPION HCL ER (XL) 150 MG PO TB24
150.0000 mg | ORAL_TABLET | Freq: Every day | ORAL | Status: DC
  Administered 2020-06-01 – 2020-06-03 (×3): 150 mg via ORAL
  Filled 2020-05-31 (×3): qty 1

## 2020-05-31 MED ORDER — POLYVINYL ALCOHOL 1.4 % OP SOLN
Freq: Two times a day (BID) | OPHTHALMIC | Status: DC | PRN
  Filled 2020-05-31: qty 15

## 2020-05-31 MED ORDER — ENOXAPARIN SODIUM 40 MG/0.4ML ~~LOC~~ SOLN
40.0000 mg | SUBCUTANEOUS | Status: DC
  Administered 2020-05-31 – 2020-06-02 (×3): 40 mg via SUBCUTANEOUS
  Filled 2020-05-31 (×3): qty 0.4

## 2020-05-31 MED ORDER — ACETAMINOPHEN 650 MG RE SUPP: 650.0000 mg | Freq: Four times a day (QID) | RECTAL | Status: DC | PRN

## 2020-05-31 MED ORDER — ONDANSETRON HCL 4 MG PO TABS: 4.0000 mg | ORAL_TABLET | Freq: Four times a day (QID) | ORAL | Status: DC | PRN

## 2020-05-31 MED ORDER — DULAGLUTIDE 1.5 MG/0.5ML ~~LOC~~ SOAJ: 1.5000 mg | Freq: Every day | SUBCUTANEOUS | Status: DC

## 2020-05-31 MED ORDER — GABAPENTIN 300 MG PO CAPS
300.0000 mg | ORAL_CAPSULE | Freq: Three times a day (TID) | ORAL | Status: DC
  Administered 2020-05-31 – 2020-06-03 (×8): 300 mg via ORAL
  Filled 2020-05-31 (×9): qty 1

## 2020-05-31 MED ORDER — MAGNESIUM HYDROXIDE 400 MG/5ML PO SUSP: 30.0000 mL | Freq: Every day | ORAL | Status: DC | PRN

## 2020-05-31 MED ORDER — SODIUM CHLORIDE 0.9 % IV SOLN
1.0000 g | Freq: Once | INTRAVENOUS | Status: AC
  Administered 2020-05-31: 1 g via INTRAVENOUS
  Filled 2020-05-31: qty 10

## 2020-05-31 MED ORDER — ONDANSETRON HCL 4 MG/2ML IJ SOLN: 4.0000 mg | Freq: Four times a day (QID) | INTRAMUSCULAR | Status: DC | PRN

## 2020-05-31 MED ORDER — LACTATED RINGERS IV BOLUS
500.0000 mL | Freq: Once | INTRAVENOUS | Status: AC
  Administered 2020-05-31: 500 mL via INTRAVENOUS

## 2020-05-31 MED ORDER — SODIUM CHLORIDE 0.9 % IV SOLN: INTRAVENOUS | Status: DC

## 2020-05-31 MED ORDER — HALOPERIDOL LACTATE 5 MG/ML IJ SOLN
1.0000 mg | Freq: Four times a day (QID) | INTRAMUSCULAR | Status: DC | PRN
  Filled 2020-05-31: qty 1

## 2020-05-31 NOTE — ED Triage Notes (Signed)
Pt brought by [facility] who were called by neighbor who said pt "not acting right" all day. EMS states possible baseline dementia. VS were CBG in 240s, BP 144/88, 96% on RA, pulse 77. Pt in room being examined by ED provider. Pt disoriented to situation, time. Oriented to place and self.

## 2020-05-31 NOTE — ED Notes (Signed)
As per provider. Neuro checks every 4 hours, while pt is awake. Pt provided with water and crackers as she was [employer]

## 2020-05-31 NOTE — ED Notes (Signed)
Pt back from CT

## 2020-05-31 NOTE — ED Notes (Addendum)
This RN in room after patient started yelling and cursing at registration. This RN went into room. Pt continues to be verbally aggressive with staff. RN informed pt this would not be tolerated. Pt states she does not understand why they need her information. RN informed her that she has to be registered so that we can treat her. Pt states that she did not know why we do have her registered when she has been here since this morning. RN informed pt that she has not been here since this morning, she was just brought in a few hours ago. Pt stating that she does not know why she is here. RN informed her she is here for altered mental status. Pt continues to argue with staff. EDP to bedside and asked pt if she was able to give a urine sample, pt states "no one told me I need to give you one or I would have". Pts glasses were found across the room. Registration informed RN that pt had throw her glasses when she was attempting to register patient.

## 2020-05-31 NOTE — ED Provider Notes (Signed)
[facility] Emergency Department Provider Note  ____________________________________________   First MD Initiated Contact with Patient 05/31/20 1613     (approximate)  I have reviewed the triage vital signs and the nursing notes.   HISTORY  Chief Complaint Altered Mental Status   HPI Kristen Guerra is a 79 y.o. 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  Patient endorses a nonproductive cough for the last couple of days and denies any other acute complaints and states she is not sure why she is in the ED.  She denies any recent falls or injuries, headache, vision changes, fevers, chills, chest pain, dental pain, vomiting, diarrhea, dysuria, rash, extremity pain, or other acute complaints.  She states she think she may have recently had a urinary tract infection but is not currently being treated for an infection.  Denies EtOH or illicit drug use.         Past Medical History:  Diagnosis Date  . Anxiety   . Arthritis   . Complication of anesthesia 2015   had trouble waking up after dental work  . Depression   . Diabetes mellitus without complication ([place])   . GERD (gastroesophageal reflux disease)   . High cholesterol   . Hypertension   . PSVT (paroxysmal supraventricular tachycardia) ([place])   . Sleep apnea   . Stroke ([place]) 2017  . SUI (stress urinary incontinence, female)     Patient Active Problem List   Diagnosis Date Noted  . AMS (altered mental status) 05/31/2020  . Polycythemia 04/04/2020  . Anxiety 02/20/2020  . B12 deficiency 02/20/2020  . Chronic back pain 02/20/2020  . Chronic vaginitis 02/20/2020  . Fibrocystic breast disease 02/20/2020  . History of colonic polyps 02/20/2020  . Migraine headache 02/20/2020  .  Non-cardiac chest pain 02/20/2020  . Senile nuclear sclerosis 02/20/2020  . Sleep apnea 02/20/2020  . SUI (stress urinary incontinence, female) 02/20/2020  . Chronic pain syndrome 02/20/2020  . Pharmacologic therapy 02/20/2020  . Disorder of skeletal system 02/20/2020  . Problems influencing health status 02/20/2020  . LBBB (left bundle branch block) 12/21/2019  . Preop cardiovascular exam 12/21/2019  . Long-term insulin use ([place]) 11/16/2018  . Post-menopausal 11/16/2018  . Mixed hyperlipidemia 11/16/2018  . DM type 2 with diabetic peripheral neuropathy ([place]) 07/21/2018  . Hx of low back pain 02/28/2018  . Back pain without sciatica 02/01/2018  . History of compression fracture of spine 02/01/2018  . Loss of memory 02/01/2018  . Mid back pain on right side 11/09/2017  . Splenomegaly 11/09/2017  . Subarachnoid hemorrhage following injury, no loss of consciousness ([place]) 05/01/2017  . 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 Lightheadedness 04/26/2017  . Weakness 04/22/2017  . History of stroke 03/17/2017  . Dizziness 02/27/2017  . UTI (urinary tract infection) 02/27/2017  . GERD (gastroesophageal reflux disease) 02/27/2017  . HTN (hypertension) 02/27/2017  . HLD (hyperlipidemia) 02/27/2017  . Diabetes ([place]) 02/27/2017  . Type 2 diabetes mellitus with hyperglycemia, with long-term current use of insulin ([place]) 09/03/2016  . Dysphagia 05/10/2014  . FH: colon cancer 05/10/2014  . Anemia 02/06/2014  . Vitamin D deficiency 02/06/2014  . Hyperlipidemia 02/06/2014  . Cataract 11/09/2013  . Depression 11/09/2013  . OAB (overactive bladder) 11/09/2013  . TIA (transient ischemic attack) 11/09/2013  . Type 2  diabetes mellitus without complications ([place]) 53/97/6734  . Gastro-esophageal reflux disease without esophagitis 11/09/2013  . Essential (primary) hypertension 11/09/2013  . Herpes zoster 03/30/2004    Past Surgical History:  Procedure Laterality Date  . ABDOMINAL HYSTERECTOMY    . BREAST  BIOPSY Right 1977   negative  . BREAST BIOPSY Right 1988   negative  . BREAST SURGERY    . CHOLECYSTECTOMY    . EYE SURGERY     cataract  . GALLBLADDER SURGERY    . SCALENOTOMY W/O RESECTION CERVICAL RIB    . XI ROBOTIC LAPAROSCOPIC ASSISTED APPENDECTOMY N/A 01/11/2020   Procedure: XI ROBOTIC LAPAROSCOPIC ASSISTED APPENDECTOMY;  Surgeon: Benjamine Sprague, DO;  Location: [facility] ORS;  Service: General;  Laterality: N/A;    Prior to Admission medications   Medication Sig Start Date End Date Taking? Authorizing Provider  Artificial Tear Solution (GENTEAL TEARS OP) Place 1 drop into both ears 2 (two) times daily as needed (dry eyes).    [provider]  aspirin EC 81 MG tablet Take 81 mg by mouth daily.     [provider]  atorvastatin (LIPITOR) 40 MG tablet Take 40 mg by mouth daily.    [provider]  buPROPion (WELLBUTRIN XL) 150 MG 24 hr tablet Take 1 tablet (150 mg total) by mouth daily. 04/25/17   Harvest Dark, MD  Cholecalciferol (VITAMIN D PO) Take 1,000 Units by mouth daily.     [provider]  clopidogrel (PLAVIX) 75 MG tablet Take 75 mg by mouth daily.  04/16/14   [provider]  cyanocobalamin 1000 MCG tablet Take 1,000 mcg by mouth daily.     [provider]  Dulaglutide (TRULICITY) 1.5 LP/3.7TK SOPN Inject 1.5 mg into the skin daily.    [provider]  DULoxetine (CYMBALTA) 60 MG capsule Take 60 mg by mouth daily.  04/16/14   [provider]  gabapentin (NEURONTIN) 300 MG capsule Take 300 mg by mouth in the morning, at noon, and at bedtime. 04/24/20 05/24/20  [provider]  HYDROcodone-acetaminophen (NORCO) 5-325 MG tablet Take 1 tablet by mouth every 6 (six) hours as needed for up to 6 doses for moderate pain. 01/11/20   Lysle Pearl, Isami, DO  ibuprofen (ADVIL) 800 MG tablet Take 1 tablet (800 mg total) by mouth every 8 (eight) hours as needed for mild pain or moderate pain. 01/11/20   Sakai, Isami, DO    insulin glargine (LANTUS) 100 unit/mL SOPN Inject 30 Units into the skin at bedtime.     [provider]  lamoTRIgine (LAMICTAL) 100 MG tablet Take 1 tablet (100 mg total) by mouth daily. 04/25/17   Harvest Dark, MD  metoprolol succinate (TOPROL-XL) 25 MG 24 hr tablet Take 25 mg by mouth daily.  04/16/14   [provider]  ONE TOUCH ULTRA TEST test strip  03/12/14   [provider]  pantoprazole (PROTONIX) 40 MG tablet Take 40 mg by mouth daily.    [provider]  Polyethyl Glycol-Propyl Glycol (SYSTANE ULTRA OP) Place 1 drop into both eyes 2 (two) times daily as needed (dry eyes).    [provider]  traZODone (DESYREL) 50 MG tablet Take 50 mg by mouth at bedtime.    [provider]    Allergies Amoxicillin and Lidocaine  Family History  Problem Relation Age of Onset  . Colon cancer Father   . Kidney cancer Neg Hx   . Bladder Cancer Neg Hx   . Breast cancer Neg  Hx     Social History Social History   Tobacco Use  . Smoking status: Never Smoker  . Smokeless tobacco: Never Used  Substance Use Topics  . Alcohol use: Not on file    Comment: occ  . Drug use: No    Review of Systems  Review of Systems  Constitutional: Negative for chills and fever.  HENT: Negative for sore throat.   Eyes: Negative for pain.  Respiratory: Negative for cough and stridor.   Cardiovascular: Negative for chest pain.  Gastrointestinal: Negative for vomiting.  Genitourinary: Negative for dysuria.  Musculoskeletal: Negative for myalgias.  Skin: Negative for rash.  Neurological: Negative for seizures, loss of consciousness and headaches.  Psychiatric/Behavioral: Positive for memory loss. Negative for suicidal ideas.  All other systems reviewed and are negative.     ____________________________________________   PHYSICAL EXAM:  VITAL SIGNS: ED Triage Vitals [05/31/20 1613]  Enc Vitals Group     BP      Pulse      Resp      Temp       Temp src      SpO2      Weight      Height      Head Circumference      Peak Flow      Pain Score 0     Pain Loc      Pain Edu?      Excl. in [place]?    Vitals:   05/31/20 1618 05/31/20 2053  BP: (!) 170/83 140/63  Pulse: 72 80  Resp: 20 18  Temp: 98.1 F (36.7 C)   SpO2: 97% 94%   Physical Exam Vitals and nursing note reviewed.  Constitutional:      General: She is not in acute distress.    Appearance: She is well-developed.  HENT:     Head: Normocephalic and atraumatic.     Right Ear: External ear normal.     Left Ear: External ear normal.     Nose: Nose normal.     Mouth/Throat:     Mouth: Mucous membranes are moist.  Eyes:     Conjunctiva/sclera: Conjunctivae normal.  Cardiovascular:     Rate and Rhythm: Normal rate and regular rhythm.     Heart sounds: No murmur heard.   Pulmonary:     Effort: Pulmonary effort is normal. No respiratory distress.     Breath sounds: Normal breath sounds.  Abdominal:     Palpations: Abdomen is soft.     Tenderness: There is no abdominal tenderness.  Musculoskeletal:     Cervical back: Neck supple.     Right lower leg: No edema.     Left lower leg: No edema.  Skin:    General: Skin is warm and dry.     Capillary Refill: Capillary refill takes less than 2 seconds.  Neurological:     Mental Status: She is alert. She is disoriented and confused.     No pronator drift.  No finger dysmetria.  Cranial nerves II through XII grossly intact.  Patient has full and symmetric strength all extremities.  Sensation is intact to light touch throughout all extremities. ____________________________________________   LABS (all labs ordered are listed, but only abnormal results are displayed)  Labs Reviewed  URINALYSIS, COMPLETE (UACMP) WITH MICROSCOPIC - Abnormal; Notable for the following components:      Result Value   Color, Urine YELLOW (*)    APPearance HAZY (*)    Glucose, UA >=  500 (*)    Protein, ur 30 (*)    Nitrite POSITIVE  (*)    Leukocytes,Ua SMALL (*)    Bacteria, UA RARE (*)    All other components within normal limits  CBC WITH DIFFERENTIAL/PLATELET - Abnormal; Notable for the following components:   WBC 10.8 (*)    RBC 5.81 (*)    Hemoglobin 15.1 (*)    HCT 48.6 (*)    RDW 16.1 (*)    Platelets 487 (*)    All other components within normal limits  COMPREHENSIVE METABOLIC PANEL - Abnormal; Notable for the following components:   Glucose, Bld 153 (*)    GFR calc non Af Amer 60 (*)    All other components within normal limits  RESPIRATORY PANEL BY RT PCR (FLU A&B, COVID)  URINE CULTURE  TSH  AMMONIA  ETHANOL  URINE DRUG SCREEN, QUALITATIVE ([facility] ONLY)  BASIC METABOLIC PANEL  CBC  CBG MONITORING, ED   ____________________________________________  EKG  Sinus rhythm with a ventricular rate of 75, left bundle branch block, otherwise unremarkable intervals with poor tracing in lead I and lead III no other clear evidence of acute ischemia. ____________________________________________  RADIOLOGY  ED MD interpretation: Chest x-ray is unremarkable.  CT head shows no evidence of intracranial bleeding or mass.  Official radiology report(s): DG Chest 2 View  Result Date: 05/31/2020 CLINICAL DATA:  Altered mental status. EXAM: CHEST - 2 VIEW COMPARISON:  November 06, 2017 FINDINGS: The heart size and mediastinal contours are within normal limits. Both lungs are clear. The visualized skeletal structures are unremarkable. IMPRESSION: No active cardiopulmonary disease. Electronically Signed   By: Virgina Norfolk M.D.   On: 05/31/2020 17:33   CT Head Wo Contrast  Result Date: 05/31/2020 CLINICAL DATA:  Mental status change EXAM: CT HEAD WITHOUT CONTRAST TECHNIQUE: Contiguous axial images were obtained from the base of the skull through the vertex without intravenous contrast. COMPARISON:  03/15/2020 FINDINGS: Brain: No evidence of acute infarction, hemorrhage, hydrocephalus, extra-axial collection or mass  lesion/mass effect. Periventricular and deep white matter hypodensity. Lacunar infarctions of the bilateral basal ganglia. Vascular: No hyperdense vessel or unexpected calcification. Skull: Normal. Negative for fracture or focal lesion. Sinuses/Orbits: No acute finding. Other: None. IMPRESSION: 1.  No acute intracranial pathology. 2. Small-vessel white matter disease and nonacute lacunar infarctions of the bilateral basal ganglia. Electronically Signed   By: Eddie Candle M.D.   On: 05/31/2020 17:06    ____________________________________________   PROCEDURES  Procedure(s) performed (including Critical Care):  .1-3 Lead EKG Interpretation Performed by: Lucrezia Starch, MD Authorized by: Lucrezia Starch, MD     Interpretation: normal     ECG rate assessment: normal     Ectopy: none     Conduction: abnormal        ____________________________________________   INITIAL IMPRESSION / ASSESSMENT AND PLAN / ED COURSE        Patient presents with [place] to history exam for assessment of altered mental status.  History is very limited from the patient secondary to altered mental status I am able to reach anyone on the phone number listed in the chart.  Patient is hypertensive otherwise stable vital signs on arrival.  Exam as above remarkable for patient that is confused without focal deficits.  Differential includes encephalopathy secondary to toxic ingestion, metabolic derangement, infectious etiologies including UTI and pneumonia, intracranial bleeding, thyroid derangement, elevated ammonia, and other etiologies.  No historical or exam findings to suggest acute traumatic injury.  Ammonia is within normal limits and there are no significant metabolic or electrolyte derangements noted on CMP.  TSH is also WNL.  Chest x-ray shows no evidence of pneumonia or other acute intrathoracic process.  Covid is negative.  Ethanol is undetectable.  Patient attempted provide a urine sample but the urine  all got absorbed by some toilet paper the patient had with her.  While undergoing ED evaluation she did become more agitated pacing around her room yelling and threatening to leave.  She received a small dose of Haldol and we will plan to obtain an out sample for urine is urinary tract infection remains within the differential.  UA does appear consistent with cystitis.  Rocephin given.  Patient admitted to hospitalist service for further evaluation management with concerns for delirium/encephalopathy secondary to UTI.   ____________________________________________   FINAL CLINICAL IMPRESSION(S) / ED DIAGNOSES  Final diagnoses:  Altered mental status, unspecified altered mental status type  Cystitis    Medications  aspirin EC tablet 81 mg (has no administration in time range)  HYDROcodone-acetaminophen (NORCO/VICODIN) 5-325 MG per tablet 1 tablet (has no administration in time range)  atorvastatin (LIPITOR) tablet 40 mg (has no administration in time range)  metoprolol succinate (TOPROL-XL) 24 hr tablet 25 mg (has no administration in time range)  DULoxetine (CYMBALTA) DR capsule 60 mg (has no administration in time range)  traZODone (DESYREL) tablet 50 mg (has no administration in time range)  Dulaglutide SOPN 1.5 mg (has no administration in time range)  insulin glargine (LANTUS) Solostar Pen 30 Units (has no administration in time range)  clopidogrel (PLAVIX) tablet 75 mg (has no administration in time range)  pantoprazole (PROTONIX) EC tablet 40 mg (has no administration in time range)  vitamin B-12 (CYANOCOBALAMIN) tablet 1,000 mcg (has no administration in time range)  gabapentin (NEURONTIN) capsule 300 mg (has no administration in time range)  lamoTRIgine (LAMICTAL) tablet 100 mg (has no administration in time range)  cholecalciferol (VITAMIN D3) tablet 1,000 Units (has no administration in time range)  polyvinyl alcohol (LIQUIFILM TEARS) 1.4 % ophthalmic solution (has no  administration in time range)  enoxaparin (LOVENOX) injection 40 mg (has no administration in time range)  0.9 %  sodium chloride infusion (has no administration in time range)  acetaminophen (TYLENOL) tablet 650 mg (has no administration in time range)    Or  acetaminophen (TYLENOL) suppository 650 mg (has no administration in time range)  traZODone (DESYREL) tablet 25 mg (has no administration in time range)  magnesium hydroxide (MILK OF MAGNESIA) suspension 30 mL (has no administration in time range)  ondansetron (ZOFRAN) tablet 4 mg (has no administration in time range)    Or  ondansetron (ZOFRAN) injection 4 mg (has no administration in time range)  cefTRIAXone (ROCEPHIN) 1 g in sodium chloride 0.9 % 100 mL IVPB (has no administration in time range)  buPROPion (WELLBUTRIN XL) 24 hr tablet 150 mg (has no administration in time range)  lactated ringers bolus 500 mL (0 mLs Intravenous Stopped 05/31/20 2051)  haloperidol lactate (HALDOL) injection 2 mg (2 mg Intravenous Given 05/31/20 1850)  cefTRIAXone (ROCEPHIN) 1 g in sodium chloride 0.9 % 100 mL IVPB (1 g Intravenous New Bag/Given 05/31/20 2052)     ED Discharge Orders    None       Note:  This document was prepared using Dragon voice recognition software and may include unintentional dictation errors.   Lucrezia Starch, MD 05/31/20 2136

## 2020-05-31 NOTE — ED Notes (Signed)
Pt taken to CT.

## 2020-05-31 NOTE — ED Notes (Signed)
Daughter called RN and given update

## 2020-05-31 NOTE — ED Notes (Signed)
Pt suspicious, standing now out of bed. Will not allow this nurse to give IV haloperidol or IV fluids. Asking for "phonebook" to call family.

## 2020-05-31 NOTE — ED Notes (Signed)
Pt pacing room.

## 2020-05-31 NOTE — ED Notes (Signed)
ED provider at bedside performing NIHSS.

## 2020-05-31 NOTE — ED Notes (Signed)
Kristen Guerra, other RN in room helping this nurse. Pt arguing but allowing Ally to connect cardiac leads and BP cuff.

## 2020-05-31 NOTE — ED Notes (Addendum)
In and out catheter completed by this RN. ER provider and Female RN at bedside while RN did. Pt placed on purewick and in new depends. As per ER provider. Pt can do the full 1054ml bag of LR and not just the 55mLs.

## 2020-05-31 NOTE — ED Notes (Signed)
Pt is laying in bed. Pt has removed the monitor. Pt states she thinks she has a UTI and RN informed her that we need to get an in and out catheter for a urine sample. Pt did not dispute. Pt was kind to RN, but believes we are in the 1980s.

## 2020-05-31 NOTE — H&P (Addendum)
[place]   PATIENT NAME: Kristen Guerra    MR#:  [id number]  DATE OF BIRTH:  [birth year]  DATE OF ADMISSION:  05/31/2020  PRIMARY CARE PHYSICIAN: Idelle Crouch, MD   REQUESTING/REFERRING PHYSICIAN: Hulan Saas, MD CHIEF COMPLAINT:   Chief Complaint  Patient presents with  . Altered Mental Status    HISTORY OF PRESENT ILLNESS:  Kristen Guerra  is a 79 y.o. pleasantly confused Caucasian female coming from home, with a known history of type 2 diabetes mellitus, hypertension, dyslipidemia, depression, PSVT, sleep apnea, CVA and GERD, who presented to the emergency room with acute onset of altered mental status.  Her neighbors called EMS as they were concerned that the patient was more confused than usual.  She denies any headache or dizziness or blurred vision.  She stated however that she bumped her head while taking shower this morning with no presyncope or syncope.  She denied any tinnitus or vertigo.  She has been having dry cough over the last couple of days.  She denied any dyspnea or wheezing.  She admitted to dysuria without urinary frequency urgency or flank pain or hematuria.  No fever or chills.  No chest pain or palpitations.  No nausea or vomiting or diarrhea or abdominal pain.  She denies any chest pain or palpitations.  Upon presentation to the emergency room temperature was 98.1 blood pressure 170/83 with otherwise normal vital signs.  Labs revealed unremarkable CMP, ammonia level of 19 CBC with WBC of 10.8 with hemoconcentration and negative influenza antigens and COVID-19 PCR.  Urinalysis was positive for UTI.  Noncontrasted head CT scan revealed no acute intracranial normalities.  It showed small vessel white matter disease nonacute lacunar infarctions in both basal ganglia.  EKG showed sinus rhythm with rate of 75 with left bundle branch block that is old compared to 03/15/2020.  The patient was given a gram of IV Rocephin and 2 mg of IV Haldol by the ER  physician as well as 500 mill IV lactated Ringer bolus.  She will be admitted to a medical bed for further evaluation and management.  PAST MEDICAL HISTORY:   Past Medical History:  Diagnosis Date  . Anxiety   . Arthritis   . Complication of anesthesia 2015   had trouble waking up after dental work  . Depression   . Diabetes mellitus without complication ([place])   . GERD (gastroesophageal reflux disease)   . High cholesterol   . Hypertension   . PSVT (paroxysmal supraventricular tachycardia) ([place])   . Sleep apnea   . Stroke ([place]) 2017  . SUI (stress urinary incontinence, female)   -Cognitive dysfunction/dementia on Aricept  PAST SURGICAL HISTORY:   Past Surgical History:  Procedure Laterality Date  . ABDOMINAL HYSTERECTOMY    . BREAST BIOPSY Right 1977   negative  . BREAST BIOPSY Right 1988   negative  . BREAST SURGERY    . CHOLECYSTECTOMY    . EYE SURGERY     cataract  . GALLBLADDER SURGERY    . SCALENOTOMY W/O RESECTION CERVICAL RIB    . XI ROBOTIC LAPAROSCOPIC ASSISTED APPENDECTOMY N/A 01/11/2020   Procedure: XI ROBOTIC LAPAROSCOPIC ASSISTED APPENDECTOMY;  Surgeon: Benjamine Sprague, DO;  Location: [facility] ORS;  Service: General;  Laterality: N/A;    SOCIAL HISTORY:   Social History   Tobacco Use  . Smoking status: Never Smoker  . Smokeless tobacco: Never Used  Substance Use Topics  . Alcohol use: Not on file  Comment: occ    FAMILY HISTORY:   Family History  Problem Relation Age of Onset  . Colon cancer Father   . Kidney cancer Neg Hx   . Bladder Cancer Neg Hx   . Breast cancer Neg Hx     DRUG ALLERGIES:   Allergies  Allergen Reactions  . Amoxicillin Itching and Other (See Comments)    Reaction: dizziness Has patient had a PCN reaction causing immediate rash, facial/tongue/throat swelling, SOB or lightheadedness with hypotension: No Has patient had a PCN reaction causing severe rash involving mucus membranes or skin necrosis: No Has patient had a PCN  reaction that required hospitalization: No Has patient had a PCN reaction occurring within the last 10 years: Yes If all of the above answers are "NO", then may proceed with Cephalosporin use.   . Lidocaine Itching    REVIEW OF SYSTEMS:   ROS As per history of present illness. All pertinent systems were reviewed above. Constitutional, HEENT, cardiovascular, respiratory, GI, GU, musculoskeletal, neuro, psychiatric, endocrine, integumentary and hematologic systems were reviewed and are otherwise negative/unremarkable except for positive findings mentioned above in the HPI.   MEDICATIONS AT HOME:   Prior to Admission medications   Medication Sig Start Date End Date Taking? Authorizing Provider  Artificial Tear Solution (GENTEAL TEARS OP) Place 1 drop into both ears 2 (two) times daily as needed (dry eyes).    [provider]  aspirin EC 81 MG tablet Take 81 mg by mouth daily.     [provider]  atorvastatin (LIPITOR) 40 MG tablet Take 40 mg by mouth daily.    [provider]  buPROPion (WELLBUTRIN XL) 150 MG 24 hr tablet Take 1 tablet (150 mg total) by mouth daily. 04/25/17   Harvest Dark, MD  Cholecalciferol (VITAMIN D PO) Take 1,000 Units by mouth daily.     [provider]  clopidogrel (PLAVIX) 75 MG tablet Take 75 mg by mouth daily.  04/16/14   [provider]  cyanocobalamin 1000 MCG tablet Take 1,000 mcg by mouth daily.     [provider]  Dulaglutide (TRULICITY) 1.5 DE/0.8XK SOPN Inject 1.5 mg into the skin daily.    [provider]  DULoxetine (CYMBALTA) 60 MG capsule Take 60 mg by mouth daily.  04/16/14   [provider]  gabapentin (NEURONTIN) 300 MG capsule Take 300 mg by mouth in the morning, at noon, and at bedtime. 04/24/20 05/24/20  [provider]  HYDROcodone-acetaminophen (NORCO) 5-325 MG tablet Take 1 tablet by mouth every 6 (six) hours as needed for up to 6 doses for moderate pain. 01/11/20    Lysle Pearl, Isami, DO  ibuprofen (ADVIL) 800 MG tablet Take 1 tablet (800 mg total) by mouth every 8 (eight) hours as needed for mild pain or moderate pain. 01/11/20   Sakai, Isami, DO  insulin glargine (LANTUS) 100 unit/mL SOPN Inject 30 Units into the skin at bedtime.     [provider]  lamoTRIgine (LAMICTAL) 100 MG tablet Take 1 tablet (100 mg total) by mouth daily. 04/25/17   Harvest Dark, MD  metoprolol succinate (TOPROL-XL) 25 MG 24 hr tablet Take 25 mg by mouth daily.  04/16/14   [provider]  ONE TOUCH ULTRA TEST test strip  03/12/14   [provider]  pantoprazole (PROTONIX) 40 MG tablet Take 40 mg by mouth daily.    [provider]  Polyethyl Glycol-Propyl Glycol (SYSTANE ULTRA OP) Place 1 drop into both eyes 2 (two) times daily  as needed (dry eyes).    [provider]  traZODone (DESYREL) 50 MG tablet Take 50 mg by mouth at bedtime.    [provider]      VITAL SIGNS:  Blood pressure 140/63, pulse 80, temperature 98.1 F (36.7 C), temperature source Oral, resp. rate 18, height 5\' 4"  (1.626 m), weight 63.5 kg, SpO2 94 %.  PHYSICAL EXAMINATION:  Physical Exam  GENERAL:  79 y.o.-year-old pleasantly confused Caucasian patient lying in the bed with no acute distress.  EYES: Pupils equal, round, reactive to light and accommodation. No scleral icterus. Extraocular muscles intact.  HEENT: Head atraumatic, normocephalic. Oropharynx and nasopharynx clear.  NECK:  Supple, no jugular venous distention. No thyroid enlargement, no tenderness.  LUNGS: Normal breath sounds bilaterally, no wheezing, rales,rhonchi or crepitation. No use of accessory muscles of respiration.  CARDIOVASCULAR: Regular rate and rhythm, S1, S2 normal. No murmurs, rubs, or gallops.  ABDOMEN: Soft, nondistended, nontender. Bowel sounds present. No organomegaly or mass.  EXTREMITIES: No pedal edema, cyanosis, or clubbing.  NEUROLOGIC: Cranial nerves II through XII  are intact. Muscle strength 5/5 in all extremities. Sensation intact. Gait not checked.  She is overall confused but alert and cooperative. PSYCHIATRIC: The patient is alert and oriented to place and person only but not completely to time.  She was able to tell me the day but not the year.  Normal affect and good eye contact. SKIN: No obvious rash, lesion, or ulcer.   LABORATORY PANEL:   CBC Recent Labs  Lab 05/31/20 1616  WBC 10.8*  HGB 15.1*  HCT 48.6*  PLT 487*   ------------------------------------------------------------------------------------------------------------------  Chemistries  Recent Labs  Lab 05/31/20 1616  NA 136  K 3.6  CL 100  CO2 30  GLUCOSE 153*  BUN 15  CREATININE 0.91  CALCIUM 9.8  AST 19  ALT 15  ALKPHOS 56  BILITOT 0.6   ------------------------------------------------------------------------------------------------------------------  Cardiac Enzymes No results for input(s): TROPONINI in the last 168 hours. ------------------------------------------------------------------------------------------------------------------  RADIOLOGY:  DG Chest 2 View  Result Date: 05/31/2020 CLINICAL DATA:  Altered mental status. EXAM: CHEST - 2 VIEW COMPARISON:  November 06, 2017 FINDINGS: The heart size and mediastinal contours are within normal limits. Both lungs are clear. The visualized skeletal structures are unremarkable. IMPRESSION: No active cardiopulmonary disease. Electronically Signed   By: Virgina Norfolk M.D.   On: 05/31/2020 17:33   CT Head Wo Contrast  Result Date: 05/31/2020 CLINICAL DATA:  Mental status change EXAM: CT HEAD WITHOUT CONTRAST TECHNIQUE: Contiguous axial images were obtained from the base of the skull through the vertex without intravenous contrast. COMPARISON:  03/15/2020 FINDINGS: Brain: No evidence of acute infarction, hemorrhage, hydrocephalus, extra-axial collection or mass lesion/mass effect. Periventricular and deep white  matter hypodensity. Lacunar infarctions of the bilateral basal ganglia. Vascular: No hyperdense vessel or unexpected calcification. Skull: Normal. Negative for fracture or focal lesion. Sinuses/Orbits: No acute finding. Other: None. IMPRESSION: 1.  No acute intracranial pathology. 2. Small-vessel white matter disease and nonacute lacunar infarctions of the bilateral basal ganglia. Electronically Signed   By: Eddie Candle M.D.   On: 05/31/2020 17:06      IMPRESSION AND PLAN:   1.  Acute encephalopathy like secondary to urinary tract infection and possibly polypharmacy. -The patient will be admitted to a medically monitored bed. -We will continue her on IV antibiotic therapy with Rocephin. -We will follow urine culture and sensitivity. -Neurochecks will be followed every 4 hours with 24 hours. -We will avoid excessive sedatives.  2.  Uncontrolled essential hypertension.  This could be secondary to her agitation initially. -We will continue Toprol-XL. -She will be placed on as needed IV labetalol.  3.  Dyslipidemia. -We will continue her statin therapy.  4.  Type 2 diabetes mellitus with peripheral neuropathy. -We will place her on supplemental coverage with NovoLog and continue her basal coverage. -We will continue her Neurontin.  5.  Cognitive dysfunction/dementia. -We will continue her Aricept.  6.  Vitamin B12 deficiency. -We will continue vitamin B12.  7.  History of CVA. -We will continue her Plavix and aspirin.  8.  Depression. -We will continue Wellbutrin XL and trazodone at night.  We will hold off her Cymbalta. -Lamictal will be continued.  9.  DVT prophylaxis. -Subcutaneous Lovenox.   All the records are reviewed and case discussed with ED provider. The plan of care was discussed in details with the patient (and family). I answered all questions. The patient agreed to proceed with the above mentioned plan. Further management will depend upon hospital  course.   CODE STATUS: Full code  Status is: Inpatient  Remains inpatient appropriate because:Altered mental status, Ongoing diagnostic testing needed not appropriate for outpatient work up, Unsafe d/c plan, IV treatments appropriate due to intensity of illness or inability to take PO and Inpatient level of care appropriate due to severity of illness   Dispo: The patient is from: Home              Anticipated d/c is to: Home              Anticipated d/c date is: 2 days              Patient currently is not medically stable to d/c.   TOTAL TIME TAKING CARE OF THIS PATIENT: 55 minutes.    Christel Mormon M.D on 05/31/2020 at 9:20 PM  [facility]   From 7 PM-7 AM, contact night-coverage [email or web address]  CC: Primary care physician; Idelle Crouch, MD

## 2020-05-31 NOTE — ED Notes (Signed)
Ambulated pt to restroom, placed hat in toilet. Pt missed hat. No urine collected at this time. Pt raising voice toward multiple staff members. Pt very confused. Pt thinks she was at [facility] this morning and keeps repeating "I don't know why I was brought from [facility] to this hospital. My sister died and my husband died and you don't care." this Rn apologized for family members passing. Pt then yelled at this RN "don't you be sarcastic with me." this RN informed pt that this RN was not being sarcastic. This RN was just trying to help pt to the restroom. Pt threw her glasses at registration. EDP at bedside. Agricultural consultant at bedside.

## 2020-05-31 NOTE — ED Notes (Signed)
Pt ripping off cardiac leads, refusing to accept IV fluids or IV haloperidol. Talking about family members who died and other people who she believes are here.

## 2020-06-01 ENCOUNTER — Inpatient Hospital Stay: Payer: Medicare Other

## 2020-06-01 DIAGNOSIS — G20 Parkinson's disease: Secondary | ICD-10-CM | POA: Insufficient documentation

## 2020-06-01 DIAGNOSIS — N3090 Cystitis, unspecified without hematuria: Principal | ICD-10-CM

## 2020-06-01 DIAGNOSIS — R4182 Altered mental status, unspecified: Secondary | ICD-10-CM | POA: Diagnosis not present

## 2020-06-01 LAB — GLUCOSE, CAPILLARY: Glucose-Capillary: 166 mg/dL — ABNORMAL HIGH (ref 70–99)

## 2020-06-01 LAB — BASIC METABOLIC PANEL
Anion gap: 7 (ref 5–15)
BUN: 9 mg/dL (ref 8–23)
CO2: 23 mmol/L (ref 22–32)
Calcium: 7.2 mg/dL — ABNORMAL LOW (ref 8.9–10.3)
Chloride: 112 mmol/L — ABNORMAL HIGH (ref 98–111)
Creatinine, Ser: 0.75 mg/dL (ref 0.44–1.00)
GFR calc Af Amer: >60 mL/min (ref >60)
GFR calc non Af Amer: >60 mL/min (ref >60)
Glucose, Bld: 152 mg/dL — ABNORMAL HIGH (ref 70–99)
Potassium: 2.9 mmol/L — ABNORMAL LOW (ref 3.5–5.1)
Sodium: 142 mmol/L (ref 135–145)

## 2020-06-01 LAB — CBC
HCT: 40.4 % (ref 36.0–46.0)
Hemoglobin: 12.3 g/dL (ref 12.0–15.0)
MCH: 26.1 pg (ref 26.0–34.0)
MCHC: 30.4 g/dL (ref 30.0–36.0)
MCV: 85.6 fL (ref 80.0–100.0)
Platelets: 318 10*3/uL (ref 150–400)
RBC: 4.72 MIL/uL (ref 3.87–5.11)
RDW: 15.9 % — ABNORMAL HIGH (ref 11.5–15.5)
WBC: 10.6 10*3/uL — ABNORMAL HIGH (ref 4.0–10.5)
nRBC: 0 % (ref 0.0–0.2)

## 2020-06-01 MED ORDER — POTASSIUM CHLORIDE CRYS ER 20 MEQ PO TBCR
40.0000 meq | EXTENDED_RELEASE_TABLET | Freq: Once | ORAL | Status: AC
  Administered 2020-06-01: 40 meq via ORAL
  Filled 2020-06-01: qty 2

## 2020-06-01 MED ORDER — HALOPERIDOL LACTATE 5 MG/ML IJ SOLN
1.0000 mg | Freq: Four times a day (QID) | INTRAMUSCULAR | Status: DC | PRN
  Administered 2020-06-01 (×2): 1 mg via INTRAVENOUS
  Filled 2020-06-01 (×2): qty 1

## 2020-06-01 NOTE — ED Notes (Signed)
Pt stating she is uncomfortable in the bed. Pt moved into hospital bed, pt able to stand and pivot into the bed. Pt seems much more comfortable at this time. Pt brief soiled. Brief changes and purewick readjusted.

## 2020-06-01 NOTE — ED Notes (Addendum)
[facility] messaged: "Kristen Guerra, This pt is coming in for a UTI. Pt has dementia at baseline. Pt called me in and stated it was like the ceiling was falling on her head and her speach seemed a little more slurred. I did a NIH on her and she was a 3. She had some right sided facial asymetry.  she is still A&O x 2 and that is where she has been tonight. You can call me at [phone] or respond to this message." Pt did state that by the end of the NIH score, it felt like the ceiling was no longer falling on her.

## 2020-06-01 NOTE — Progress Notes (Signed)
PROGRESS NOTE    Patient: Kristen Guerra                            PCP: Idelle Crouch, MD                    DOB: Jul 02, 1941            DOA: 05/31/2020 JAS:[id number]             DOS: 06/01/2020, 8:23 AM   LOS: 1 day   Date of Service: The patient was seen and examined on 06/01/2020  Subjective:   The patient was seen and examined this Am. Hemodynamically stable Confused ----currently pleasant Overnight was agitated   Brief Narrative:  Per HPI Kristen Guerra  is a 79 y.o. white pleasantly confused Caucasian female coming from home, with a known history of type 2 diabetes mellitus, hypertension, dyslipidemia, depression, PSVT, sleep apnea, CVA and GERD, who presented to the emergency room with acute onset of altered mental status.    ED;  Vitals were stable except for elevated blood pressure 170/83 Labs: Unremarkable Negative influenza antigens and COVID-19 PCR.   Urinalysis was positive for UTI.   Noncontrasted head CT scan revealed no acute intracranial normalities.   EKG showed sinus rhythm with rate of 75 with left bundle branch block that is old compared to 03/15/2020.  The patient was given a gram of IV Rocephin and 2 mg of IV Haldol by the ER physician as well as 500 mill IV lactated Ringer bolus.  She will be admitted to a medical bed for further evaluation and management.    Assessment & Plan:   Active Problems:   AMS (altered mental status)     Acute encephalopathy like secondary to urinary tract infection and possibly polypharmacy.  underlying dementia -Confused, but pleasant -Overnight agitated -Alert oriented x1 only to herself -As needed Haldol   -We will continue  IV antibiotic therapy with Rocephin. -Urine culture >>  -Continue neurochecks  Uncontrolled essential hypertension.  This could be secondary to her agitation initially. -We will continue Toprol-XL. --Stable -As needed hydralazine.   Dyslipidemia. -Continue  statins  Type 2 diabetes mellitus with peripheral neuropathy. -We will check her blood sugar QA CHS, with SSI coverage NovoLog -We will continue her Neurontin.  Cognitive dysfunction/dementia. -We will continue her Aricept.  Vitamin B12 deficiency. -We will continue vitamin B12.  History of CVA. -We will continue her Plavix and aspirin. -Stable  Depression. -We will continue Wellbutrin XL and trazodone at night.  We will hold off her Cymbalta. -Lamictal will be continued.      Nutritional status:         Cultures; Blood Cultures x 2 >> NGT Urine Culture  >>> NGT   Antimicrobials: 05/31/2020 IV Rocephin>>     Consultants: None   ------------------------------------------------------------------------------------------------------------------------------------------------  DVT prophylaxis:  SCD/Compression stockings and Lovenox SQ Code Status:   Code Status: Full Code Family Communication: No family member present at bedside- attempt will be made to update daily The above findings and plan of care has been discussed with patient (and family )  in detail,  they expressed understanding and agreement of above. -Advance care planning has been discussed.   Admission status:    Status is: Inpatient  Remains inpatient appropriate because:Inpatient level of care appropriate due to severity of illness   Dispo: The patient is from: Home  Anticipated d/c is to: Home              Anticipated d/c date is: 3 days              Patient currently is not medically stable to d/c.        Procedures:   No admission procedures for hospital encounter.     Antimicrobials:  Anti-infectives (From admission, onward)   Start     Dose/Rate Route Frequency Ordered Stop   06/01/20 2200  cefTRIAXone (ROCEPHIN) 1 g in sodium chloride 0.9 % 100 mL IVPB        1 g 200 mL/hr over 30 Minutes Intravenous Every 24 hours 05/31/20 2118     05/31/20 2045   cefTRIAXone (ROCEPHIN) 1 g in sodium chloride 0.9 % 100 mL IVPB        1 g 200 mL/hr over 30 Minutes Intravenous  Once 05/31/20 2031 05/31/20 2210       Medication:  . aspirin EC  81 mg Oral Daily  . atorvastatin  40 mg Oral Daily  . buPROPion  150 mg Oral Daily  . cholecalciferol  1,000 Units Oral Daily  . clopidogrel  75 mg Oral Daily  . Dulaglutide  1.5 mg Subcutaneous Daily  . enoxaparin (LOVENOX) injection  40 mg Subcutaneous Q24H  . gabapentin  300 mg Oral TID  . insulin glargine  30 Units Subcutaneous QHS  . lamoTRIgine  100 mg Oral Daily  . metoprolol succinate  25 mg Oral Daily  . pantoprazole  40 mg Oral Daily  . traZODone  50 mg Oral QHS  . cyanocobalamin  1,000 mcg Oral Daily    acetaminophen **OR** acetaminophen, haloperidol lactate, HYDROcodone-acetaminophen, labetalol, magnesium hydroxide, ondansetron **OR** ondansetron (ZOFRAN) IV, polyvinyl alcohol, traZODone   Objective:   Vitals:   06/01/20 0223 06/01/20 0259 06/01/20 0358 06/01/20 0639  BP:  (!) 140/59 (!) 155/84 129/64  Pulse:  65 69 78  Resp: 14 18 18 18   Temp:      TempSrc:      SpO2:  96% 99% 94%  Weight:      Height:        Intake/Output Summary (Last 24 hours) at 06/01/2020 9480 Last data filed at 06/01/2020 1655 Gross per 24 hour  Intake --  Output 100 ml  Net -100 ml   Filed Weights   05/31/20 1613  Weight: 63.5 kg     Examination:   Physical Exam  Constitution:  Alert, cooperative, no distress,  Appears calm and comfortable, confused Psychiatric: Normal and stable mood and affect, cognition intact,   HEENT: Normocephalic, PERRL, otherwise with in Normal limits  Chest:Chest symmetric Cardio vascular:  S1/S2, RRR, No murmure, No Rubs or Gallops  pulmonary: Clear to auscultation bilaterally, respirations unlabored, negative wheezes / crackles Abdomen: Soft, non-tender, non-distended, bowel sounds,no masses, no organomegaly Muscular skeletal: Limited exam - in bed, able to move  all 4 extremities, Normal strength,  Neuro: CNII-XII intact. , normal motor and sensation, reflexes intact  Extremities: No pitting edema lower extremities, +2 pulses  Skin: Dry, warm to touch, negative for any Rashes, No open wounds Wounds: per nursing documentation -none visible    ------------------------------------------------------------------------------------------------------------------------------------------    LABs:  CBC Latest Ref Rng & Units 06/01/2020 05/31/2020 04/07/2020  WBC 4.0 - 10.5 K/uL 10.6(H) 10.8(H) 13.5(H)  Hemoglobin 12.0 - 15.0 g/dL 12.3 15.1(H) 16.3(H)  Hematocrit 36 - 46 % 40.4 48.6(H) 52.2(H)  Platelets 150 - 400 K/uL 318 487(H) 547(H)  CMP Latest Ref Rng & Units 06/01/2020 05/31/2020 03/15/2020  Glucose 70 - 99 mg/dL 152(H) 153(H) 145(H)  BUN 8 - 23 mg/dL 9 15 16   Creatinine 0.44 - 1.00 mg/dL 0.75 0.91 0.97  Sodium 135 - 145 mmol/L 142 136 139  Potassium 3.5 - 5.1 mmol/L 2.9(L) 3.6 3.5  Chloride 98 - 111 mmol/L 112(H) 100 107  CO2 22 - 32 mmol/L 23 30 23   Calcium 8.9 - 10.3 mg/dL 7.2(L) 9.8 9.7  Total Protein 6.5 - 8.1 g/dL - 6.6 7.4  Total Bilirubin 0.3 - 1.2 mg/dL - 0.6 0.6  Alkaline Phos 38 - 126 U/L - 56 49  AST 15 - 41 U/L - 19 18  ALT 0 - 44 U/L - 15 14       Micro Results Recent Results (from the past 240 hour(s))  Respiratory Panel by RT PCR (Flu A&B, Covid) - Nasopharyngeal Swab     Status: None   Collection Time: 05/31/20  4:46 PM   Specimen: Nasopharyngeal Swab  Result Value Ref Range Status   SARS Coronavirus 2 by RT PCR NEGATIVE NEGATIVE Final    Comment: (NOTE) SARS-CoV-2 target nucleic acids are NOT DETECTED.  The SARS-CoV-2 RNA is generally detectable in upper respiratoy specimens during the acute phase of infection. The lowest concentration of SARS-CoV-2 viral copies this assay can detect is 131 copies/mL. A negative result does not preclude SARS-Cov-2 infection and should not be used as the sole basis for treatment  or other patient management decisions. A negative result may occur with  improper specimen collection/handling, submission of specimen other than nasopharyngeal swab, presence of viral mutation(s) within the areas targeted by this assay, and inadequate number of viral copies (<131 copies/mL). A negative result must be combined with clinical observations, patient history, and epidemiological information. The expected result is Negative.  Fact Sheet for Patients:  PinkCheek.be  Fact Sheet for Healthcare Providers:  GravelBags.it  This test is no t yet approved or cleared by the [place] FDA and  has been authorized for detection and/or diagnosis of SARS-CoV-2 by FDA under an Emergency Use Authorization (EUA). This EUA will remain  in effect (meaning this test can be used) for the duration of the COVID-19 declaration under Section 564(b)(1) of the Act, 21 U.S.C. section 360bbb-3(b)(1), unless the authorization is terminated or revoked sooner.     Influenza A by PCR NEGATIVE NEGATIVE Final   Influenza B by PCR NEGATIVE NEGATIVE Final    Comment: (NOTE) The Xpert Xpress SARS-CoV-2/FLU/RSV assay is intended as an aid in  the diagnosis of influenza from Nasopharyngeal swab specimens and  should not be used as a sole basis for treatment. Nasal washings and  aspirates are unacceptable for Xpert Xpress SARS-CoV-2/FLU/RSV  testing.  Fact Sheet for Patients: PinkCheek.be  Fact Sheet for Healthcare Providers: GravelBags.it  This test is not yet approved or cleared by the [place] FDA and  has been authorized for detection and/or diagnosis of SARS-CoV-2 by  FDA under an Emergency Use Authorization (EUA). This EUA will remain  in effect (meaning this test can be used) for the duration of the  Covid-19 declaration under Section 564(b)(1) of the Act, 21  U.S.C.  section 360bbb-3(b)(1), unless the authorization is  terminated or revoked. Performed at [facility], [address], [place], [place] [address]     Radiology Reports DG Chest 2 View  Result Date: 05/31/2020 CLINICAL DATA:  Altered mental status. EXAM: CHEST - 2 VIEW COMPARISON:  November 06, 2017 FINDINGS: The heart size and mediastinal contours are within normal limits. Both lungs are clear. The visualized skeletal structures are unremarkable. IMPRESSION: No active cardiopulmonary disease. Electronically Signed   By: Virgina Norfolk M.D.   On: 05/31/2020 17:33   CT HEAD WO CONTRAST  Result Date: 06/01/2020 CLINICAL DATA:  Slurred speech EXAM: CT HEAD WITHOUT CONTRAST TECHNIQUE: Contiguous axial images were obtained from the base of the skull through the vertex without intravenous contrast. COMPARISON:  May 31, 2020 FINDINGS: Brain: No evidence of acute territorial infarction, hemorrhage, hydrocephalus,extra-axial collection or mass lesion/mass effect. There is dilatation the ventricles and sulci consistent with age-related atrophy. Low-attenuation changes in the deep white matter consistent with small vessel ischemia. Bilateral basal ganglia and left occipital lobe lacunar infarcts are noted. Vascular: No hyperdense vessel or unexpected calcification. Skull: The skull is intact. No fracture or focal lesion identified. Sinuses/Orbits: The visualized paranasal sinuses and mastoid air cells are clear. The orbits and globes intact. Other: None IMPRESSION: No acute intracranial abnormality. Findings consistent with age related atrophy and chronic small vessel ischemia Electronically Signed   By: Prudencio Pair M.D.   On: 06/01/2020 02:56   CT Head Wo Contrast  Result Date: 05/31/2020 CLINICAL DATA:  Mental status change EXAM: CT HEAD WITHOUT CONTRAST TECHNIQUE: Contiguous axial images were obtained from the base of the skull through the vertex without intravenous contrast. COMPARISON:   03/15/2020 FINDINGS: Brain: No evidence of acute infarction, hemorrhage, hydrocephalus, extra-axial collection or mass lesion/mass effect. Periventricular and deep white matter hypodensity. Lacunar infarctions of the bilateral basal ganglia. Vascular: No hyperdense vessel or unexpected calcification. Skull: Normal. Negative for fracture or focal lesion. Sinuses/Orbits: No acute finding. Other: None. IMPRESSION: 1.  No acute intracranial pathology. 2. Small-vessel white matter disease and nonacute lacunar infarctions of the bilateral basal ganglia. Electronically Signed   By: Eddie Candle M.D.   On: 05/31/2020 17:06    SIGNED: Deatra James, MD, FACP, FHM. [facility],  Pager (please use [email or web address] to page/text)  If 7PM-7AM, please contact night-coverage [email or web address] Ota [facility] 06/01/2020, 8:23 AM

## 2020-06-01 NOTE — ED Notes (Signed)
hospitalist messaged and informed that the CT has resulted.

## 2020-06-01 NOTE — ED Notes (Signed)
Hospitalitis called RN and stated she will put a head CT in for this pt as a STAT order.

## 2020-06-01 NOTE — ED Notes (Signed)
Pt continuously yelling out stating, "help me, help me, I need help." Reassured pt she was was at the hospital r/t a UTI.

## 2020-06-01 NOTE — ED Notes (Signed)
Pt given dinner meal tray at this time.

## 2020-06-01 NOTE — ED Notes (Signed)
Pt repeatedly trying to get out of bed stating she has to urinate. Pt using the purewick. Pt brief dry and clean. Reassured pt that she has been peeing and the urine is going into the suction canister.

## 2020-06-02 ENCOUNTER — Encounter: Payer: Self-pay | Admitting: Family Medicine

## 2020-06-02 DIAGNOSIS — R410 Disorientation, unspecified: Secondary | ICD-10-CM | POA: Diagnosis not present

## 2020-06-02 DIAGNOSIS — F0281 Dementia in other diseases classified elsewhere with behavioral disturbance: Secondary | ICD-10-CM

## 2020-06-02 DIAGNOSIS — R4182 Altered mental status, unspecified: Secondary | ICD-10-CM | POA: Diagnosis not present

## 2020-06-02 DIAGNOSIS — N3090 Cystitis, unspecified without hematuria: Secondary | ICD-10-CM | POA: Diagnosis not present

## 2020-06-02 DIAGNOSIS — K2100 Gastro-esophageal reflux disease with esophagitis, without bleeding: Secondary | ICD-10-CM

## 2020-06-02 DIAGNOSIS — E782 Mixed hyperlipidemia: Secondary | ICD-10-CM

## 2020-06-02 DIAGNOSIS — G20 Parkinson's disease: Secondary | ICD-10-CM

## 2020-06-02 DIAGNOSIS — E538 Deficiency of other specified B group vitamins: Secondary | ICD-10-CM

## 2020-06-02 DIAGNOSIS — N3000 Acute cystitis without hematuria: Secondary | ICD-10-CM

## 2020-06-02 LAB — GLUCOSE, CAPILLARY: Glucose-Capillary: 142 mg/dL — ABNORMAL HIGH (ref 70–99)

## 2020-06-02 LAB — MAGNESIUM: Magnesium: 1.6 mg/dL — ABNORMAL LOW (ref 1.7–2.4)

## 2020-06-02 MED ORDER — INSULIN ASPART 100 UNIT/ML ~~LOC~~ SOLN: 0.0000 [IU] | Freq: Every day | SUBCUTANEOUS | Status: DC

## 2020-06-02 MED ORDER — INSULIN ASPART 100 UNIT/ML ~~LOC~~ SOLN
0.0000 [IU] | Freq: Three times a day (TID) | SUBCUTANEOUS | Status: DC
  Administered 2020-06-03: 1 [IU] via SUBCUTANEOUS
  Filled 2020-06-02: qty 1

## 2020-06-02 MED ORDER — POTASSIUM CHLORIDE CRYS ER 20 MEQ PO TBCR
40.0000 meq | EXTENDED_RELEASE_TABLET | Freq: Once | ORAL | Status: AC
  Administered 2020-06-02: 40 meq via ORAL
  Filled 2020-06-02: qty 2

## 2020-06-02 MED ORDER — MAGNESIUM SULFATE 2 GM/50ML IV SOLN
2.0000 g | Freq: Once | INTRAVENOUS | Status: AC
  Administered 2020-06-02: 2 g via INTRAVENOUS
  Filled 2020-06-02: qty 50

## 2020-06-02 NOTE — ED Notes (Signed)
Pt provided with bed bath by this writer and [employer], pt also provided with clean linen and new warm blankets. Pt purwick replaced. Pt is resting comfortably at this time with no further complaints or needs.

## 2020-06-02 NOTE — Progress Notes (Signed)
OT Cancellation Note  Patient Details Name: Kristen Guerra MRN: [id number] DOB: 03/18/1941   Cancelled Treatment:    Reason Eval/Treat Not Completed: Medical issues which prohibited therapy.  OT order received and chart reviewed. Pt's with K+ value of 2.9 which is contraindicated for OT intervention at this time. OT will re-attempt when pt is able to participate.  Darleen Crocker, MS, OTR/L , CBIS ascom [phone]  06/02/20, 1:10 PM   06/02/2020, 1:09 PM

## 2020-06-02 NOTE — ED Notes (Signed)
This RN to bedside, medication administered per order. Pt tolerated well. Pt repositioned in bed. Pt denies further needs. Call bell within reach. Lights remain dimmed for patient comfort.

## 2020-06-02 NOTE — TOC Initial Note (Addendum)
Transition of Care [facility]) - Initial/Assessment Note    Patient Details  Name: Kristen Guerra MRN: [id number] Date of Birth: 06/05/1941  Transition of Care [facility]) CM/SW Contact:    Iona Beard, [place] Phone Number: 06/02/2020, 3:52 PM  Clinical Narrative:                 Pt admitted for UTI and altered mental status. TOC spoke with pt .CSW spoke with pts daughter Rennie Natter [phone] to complete pt assessment. Per daughter pt lives in a retirement community ([employer] [facility] Pt has someone come into the home 1-2 times weekly and neighbors that can help out. Pts daughter states that her and her siblings are actively seeking an ALF to place their mother. Pt has no family in [place] all of her children live in [place] or [place]. Pts daughter does not fully understand why pt would need to go to rehab after only having a UTI. CSW explained to pts daughter that it is to help pt gain back strength so that pt can be successful upon return home. Pts daughter to speak with her siblings then return call to CSW with decision on [facility] vs SNF. TOC to follow.  Addendum: 3:52pm: TOC spoke with pts daughter about decision over SNF vs HH. Pts family has decided on SNF for pt upon discharge.   SNF placement pending PT recommendations.   Expected Discharge Plan: Skilled Nursing Facility Barriers to Discharge: Continued Medical Work up   Patient Goals and CMS Choice Patient states their goals for this hospitalization and ongoing recovery are:: SNF for rehab. CMS Medicare.gov Compare Post Acute Care list provided to:: Patient Represenative (must comment) Rennie Natter (daughter)) Choice offered to / list presented to : Adult Children  Expected Discharge Plan and Services Expected Discharge Plan: [place] Choice: [place] arrangements for the past 2 months: [place]                 DME Arranged: N/A DME Agency: NA        HH Arranged: NA HH Agency: NA        Prior Living Arrangements/Services Living arrangements for the past 2 months: [place] Lives with:: Self Patient language and need for interpreter reviewed:: Yes        Need for Family Participation in Patient Care: Yes (Comment) (Pts daughter Benjamine Mola) Care giver support system in place?: Yes (comment)   Criminal Activity/Legal Involvement Pertinent to Current Situation/Hospitalization: No - Comment as needed  Activities of Daily Living      Permission Sought/Granted Permission sought to share information with : Family Supports Rennie Natter [phone]) Permission granted to share information with : Yes, Verbal Permission Granted  Share Information with NAME: Rennie Natter     Permission granted to share info w Relationship: Daughter  Permission granted to share info w Contact Information: [phone]  Emotional Assessment Appearance:: Appears stated age Attitude/Demeanor/Rapport: Engaged Affect (typically observed): Accepting Orientation: : Oriented to Self Alcohol / Substance Use: Not Applicable Psych Involvement: No (comment)  Admission diagnosis:  AMS (altered mental status) [R41.82] Patient Active Problem List   Diagnosis Date Noted  . Dementia due to Parkinson's disease with behavioral disturbance ([place]) 06/01/2020  . AMS (altered mental status) 05/31/2020  . Polycythemia 04/04/2020  . Anxiety 02/20/2020  . B12 deficiency 02/20/2020  . Chronic back pain 02/20/2020  . Chronic vaginitis 02/20/2020  . Fibrocystic breast disease 02/20/2020  .  History of colonic polyps 02/20/2020  . Migraine headache 02/20/2020  . Non-cardiac chest pain 02/20/2020  . Senile nuclear sclerosis 02/20/2020  . Sleep apnea 02/20/2020  . SUI (stress urinary incontinence, female) 02/20/2020  . Chronic pain syndrome 02/20/2020  . Pharmacologic therapy 02/20/2020  . Disorder of skeletal system 02/20/2020  .  Problems influencing health status 02/20/2020  . LBBB (left bundle branch block) 12/21/2019  . Preop cardiovascular exam 12/21/2019  . Long-term insulin use ([place]) 11/16/2018  . Post-menopausal 11/16/2018  . Mixed hyperlipidemia 11/16/2018  . DM type 2 with diabetic peripheral neuropathy ([place]) 07/21/2018  . Hx of low back pain 02/28/2018  . Back pain without sciatica 02/01/2018  . History of compression fracture of spine 02/01/2018  . Loss of memory 02/01/2018  . Mid back pain on right side 11/09/2017  . Splenomegaly 11/09/2017  . Subarachnoid hemorrhage following injury, no loss of consciousness ([place]) 05/01/2017  . 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 Lightheadedness 04/26/2017  . Weakness 04/22/2017  . History of stroke 03/17/2017  . Dizziness 02/27/2017  . UTI (urinary tract infection) 02/27/2017  . GERD (gastroesophageal reflux disease) 02/27/2017  . HTN (hypertension) 02/27/2017  . HLD (hyperlipidemia) 02/27/2017  . Diabetes ([place]) 02/27/2017  . Type 2 diabetes mellitus with hyperglycemia, with long-term current use of insulin ([place]) 09/03/2016  . Dysphagia 05/10/2014  . FH: colon cancer 05/10/2014  . Anemia 02/06/2014  . Vitamin D deficiency 02/06/2014  . Hyperlipidemia 02/06/2014  . Cataract 11/09/2013  . Depression 11/09/2013  . OAB (overactive bladder) 11/09/2013  . TIA (transient ischemic attack) 11/09/2013  . Type 2 diabetes mellitus without complications ([place]) 36/64/4034  . Gastro-esophageal reflux disease without esophagitis 11/09/2013  . Essential (primary) hypertension 11/09/2013  . Herpes zoster 03/30/2004   PCP:  Idelle Crouch, MD Pharmacy:   [facility], [place] [address] Phone: [phone] Fax: [phone]     Social Determinants of Health (SDOH) Interventions    Readmission Risk Interventions No flowsheet data found.

## 2020-06-02 NOTE — ED Notes (Signed)
Pt repeatedly calling out. Pt needing to be oriented to time and situation each time.

## 2020-06-02 NOTE — ED Notes (Signed)
Attempted to call report

## 2020-06-02 NOTE — ED Notes (Signed)
Mepilex applied to patient's sacral area per her request at this time, pt denies further needs. Call bell within reach.

## 2020-06-02 NOTE — ED Notes (Signed)
This RN at bedside. Pt pulled her IV out

## 2020-06-02 NOTE — ED Notes (Signed)
Pt disoriented x3.  Bed alarm in place purewick in place

## 2020-06-02 NOTE — ED Notes (Signed)
This RN and [place], EDT to bedside, pt noted to have urinated around the [place]. Full linen changed and bed bath performed using bath wipes. Pt tolerated well. Pure wick replaced by this RN. New yellow socks placed on patient. Call bell remains in reach of patient. Lights dimmed for patient comfort at this time.

## 2020-06-02 NOTE — Progress Notes (Signed)
PROGRESS NOTE    Patient: Kristen Guerra                            PCP: Idelle Crouch, MD                    DOB: [birth year]            DOA: 05/31/2020 [id number]             DOS: 06/02/2020, 12:26 PM   LOS: 2 days   Date of Service: The patient was seen and examined on 06/02/2020  Subjective:   The patient was seen and examined this morning, remains severely confused Less agitated Hemodynamically stable this a.m.  Brief Narrative:  Per HPI Kristen Guerra  is a 79 y.o. white pleasantly confused Caucasian female coming from home, with a known history of type 2 diabetes mellitus, hypertension, dyslipidemia, depression, PSVT, sleep apnea, CVA and GERD, who presented to the emergency room with acute onset of altered mental status.    ED;  Vitals were stable except for elevated blood pressure 170/83 Labs: Unremarkable Negative influenza antigens and COVID-19 PCR.   Urinalysis was positive for UTI.   Noncontrasted head CT scan revealed no acute intracranial normalities.   EKG showed sinus rhythm with rate of 75 with left bundle branch block that is old compared to 03/15/2020.  The patient was given a gram of IV Rocephin and 2 mg of IV Haldol by the ER physician as well as 500 mill IV lactated Ringer bolus.  She will be admitted to a medical bed for further evaluation and management.    Assessment & Plan:   Principal Problem:   AMS (altered mental status) Active Problems:   UTI (urinary tract infection)   GERD (gastroesophageal reflux disease)   HTN (hypertension)   HLD (hyperlipidemia)   Diabetes (HCC)   B12 deficiency   Dementia due to Parkinson's disease with behavioral disturbance ([place])     Acute encephalopathy like secondary to urinary tract infection and possibly polypharmacy.  underlying dementia -Remained confused with less agitation -Alert oriented x1 only to herself -As needed Haldol   -We will continue  IV antibiotic therapy with  Rocephin. -Urine culture >> growing greater 100 K E. coli pending sensitivity -Continue neurochecks  Uncontrolled essential hypertension.  This could be secondary to her agitation initially. -We will continue Toprol-XL. -Remained stable -As needed hydralazine.   Dyslipidemia. -Continue statins  Type 2 diabetes mellitus with peripheral neuropathy. -We will check her blood sugar QA CHS, with SSI coverage NovoLog -We will continue her Neurontin. -Monitor blood sugars  Cognitive dysfunction/dementia. -We will continue her Aricept. -Confused -Less behavior changes, less agitated today  Vitamin B12 deficiency. -We will continue vitamin B12.  History of CVA. -We will continue her Plavix and aspirin. --Stable with chronic debility  Depression. -We will continue Wellbutrin XL and trazodone at night.  We will hold off her Cymbalta. -Lamictal will be continued.      Nutritional status:         Cultures; Urine Culture  >>> greater 100 K colonies E. coli-pending sensitivity Respiratory panel influenza AB- SARS-CoV-2 negative  Antimicrobials: 05/31/2020 IV Rocephin>>     Consultants: None   ------------------------------------------------------------------------------------------------------------------------------------------------  DVT prophylaxis:  SCD/Compression stockings and Lovenox SQ Code Status:   Code Status: Full Code Family Communication: No family member present at bedside- attempt will be made to update daily The above findings and  plan of care has been discussed with patient (and family )  in detail,  they expressed understanding and agreement of above. -Advance care planning has been discussed.   Admission status:    Status is: Inpatient  Remains inpatient appropriate because:Inpatient level of care appropriate due to severity of illness   Dispo: The patient is from: Home              Anticipated d/c is to: Home vs SNF               Anticipated d/c date is: Likely in a.m. to SNF              Patient currently is not medically stable to d/c.        Procedures:   No admission procedures for hospital encounter.     Antimicrobials:  Anti-infectives (From admission, onward)   Start     Dose/Rate Route Frequency Ordered Stop   06/01/20 2200  cefTRIAXone (ROCEPHIN) 1 g in sodium chloride 0.9 % 100 mL IVPB        1 g 200 mL/hr over 30 Minutes Intravenous Every 24 hours 05/31/20 2118 06/06/20 2159   05/31/20 2045  cefTRIAXone (ROCEPHIN) 1 g in sodium chloride 0.9 % 100 mL IVPB        1 g 200 mL/hr over 30 Minutes Intravenous  Once 05/31/20 2031 05/31/20 2210       Medication:  . aspirin EC  81 mg Oral Daily  . atorvastatin  40 mg Oral Daily  . buPROPion  150 mg Oral Daily  . cholecalciferol  1,000 Units Oral Daily  . clopidogrel  75 mg Oral Daily  . enoxaparin (LOVENOX) injection  40 mg Subcutaneous Q24H  . gabapentin  300 mg Oral TID  . insulin glargine  30 Units Subcutaneous QHS  . lamoTRIgine  100 mg Oral Daily  . metoprolol succinate  25 mg Oral Daily  . pantoprazole  40 mg Oral Daily  . traZODone  50 mg Oral QHS  . cyanocobalamin  1,000 mcg Oral Daily    acetaminophen **OR** acetaminophen, haloperidol lactate, HYDROcodone-acetaminophen, labetalol, ondansetron **OR** ondansetron (ZOFRAN) IV, polyvinyl alcohol, traZODone   Objective:   Vitals:   06/02/20 0735 06/02/20 0830 06/02/20 1033 06/02/20 1130  BP: (!) 148/57 (!) 144/73 130/62 (!) 129/54  Pulse: 67 (!) 59 68 65  Resp: 20 18 20 20   Temp:    98 F (36.7 C)  TempSrc:    Oral  SpO2: 99% 100% 93% 93%  Weight:      Height:        Intake/Output Summary (Last 24 hours) at 06/02/2020 1226 Last data filed at 06/02/2020 1029 Gross per 24 hour  Intake 3472.03 ml  Output 1700 ml  Net 1772.03 ml   Filed Weights   05/31/20 1613  Weight: 63.5 kg     Examination:      Physical Exam:   General:  Alert, oriented, cooperative, no  distress; confused  HEENT:  Normocephalic, PERRL, otherwise with in Normal limits   Neuro:  CNII-XII intact. , normal motor and sensation, reflexes intact   Lungs:   Clear to auscultation BL, Respirations unlabored, no wheezes / crackles  Cardio:    S1/S2, RRR, No murmure, No Rubs or Gallops   Abdomen:   Soft, non-tender, bowel sounds active all four quadrants,  no guarding or peritoneal signs.  Muscular skeletal:   Severe generalized weaknesses, Limited exam - in bed, able to move all 4  extremities, Normal strength,  2+ pulses,  symmetric, No pitting edema  Skin:  Dry, warm to touch, negative for any Rashes, No open wounds  Wounds: Please see nursing documentation         ------------------------------------------------------------------------------------------------------------------------------------------    LABs:  CBC Latest Ref Rng & Units 06/01/2020 05/31/2020 04/07/2020  WBC 4.0 - 10.5 K/uL 10.6(H) 10.8(H) 13.5(H)  Hemoglobin 12.0 - 15.0 g/dL 12.3 15.1(H) 16.3(H)  Hematocrit 36 - 46 % 40.4 48.6(H) 52.2(H)  Platelets 150 - 400 K/uL 318 487(H) 547(H)   CMP Latest Ref Rng & Units 06/01/2020 05/31/2020 03/15/2020  Glucose 70 - 99 mg/dL 152(H) 153(H) 145(H)  BUN 8 - 23 mg/dL 9 15 16   Creatinine 0.44 - 1.00 mg/dL 0.75 0.91 0.97  Sodium 135 - 145 mmol/L 142 136 139  Potassium 3.5 - 5.1 mmol/L 2.9(L) 3.6 3.5  Chloride 98 - 111 mmol/L 112(H) 100 107  CO2 22 - 32 mmol/L 23 30 23   Calcium 8.9 - 10.3 mg/dL 7.2(L) 9.8 9.7  Total Protein 6.5 - 8.1 g/dL - 6.6 7.4  Total Bilirubin 0.3 - 1.2 mg/dL - 0.6 0.6  Alkaline Phos 38 - 126 U/L - 56 49  AST 15 - 41 U/L - 19 18  ALT 0 - 44 U/L - 15 14       Micro Results Recent Results (from the past 240 hour(s))  Respiratory Panel by RT PCR (Flu A&B, Covid) - Nasopharyngeal Swab     Status: None   Collection Time: 05/31/20  4:46 PM   Specimen: Nasopharyngeal Swab  Result Value Ref Range Status   SARS Coronavirus 2 by RT PCR NEGATIVE NEGATIVE  Final    Comment: (NOTE) SARS-CoV-2 target nucleic acids are NOT DETECTED.  The SARS-CoV-2 RNA is generally detectable in upper respiratoy specimens during the acute phase of infection. The lowest concentration of SARS-CoV-2 viral copies this assay can detect is 131 copies/mL. A negative result does not preclude SARS-Cov-2 infection and should not be used as the sole basis for treatment or other patient management decisions. A negative result may occur with  improper specimen collection/handling, submission of specimen other than nasopharyngeal swab, presence of viral mutation(s) within the areas targeted by this assay, and inadequate number of viral copies (<131 copies/mL). A negative result must be combined with clinical observations, patient history, and epidemiological information. The expected result is Negative.  Fact Sheet for Patients:  PinkCheek.be  Fact Sheet for Healthcare Providers:  GravelBags.it  This test is no t yet approved or cleared by the [place] FDA and  has been authorized for detection and/or diagnosis of SARS-CoV-2 by FDA under an Emergency Use Authorization (EUA). This EUA will remain  in effect (meaning this test can be used) for the duration of the COVID-19 declaration under Section 564(b)(1) of the Act, 21 U.S.C. section 360bbb-3(b)(1), unless the authorization is terminated or revoked sooner.     Influenza A by PCR NEGATIVE NEGATIVE Final   Influenza B by PCR NEGATIVE NEGATIVE Final    Comment: (NOTE) The Xpert Xpress SARS-CoV-2/FLU/RSV assay is intended as an aid in  the diagnosis of influenza from Nasopharyngeal swab specimens and  should not be used as a sole basis for treatment. Nasal washings and  aspirates are unacceptable for Xpert Xpress SARS-CoV-2/FLU/RSV  testing.  Fact Sheet for Patients: PinkCheek.be  Fact Sheet for Healthcare  Providers: GravelBags.it  This test is not yet approved or cleared by the [place] FDA and  has been authorized for detection and/or  diagnosis of SARS-CoV-2 by  FDA under an Emergency Use Authorization (EUA). This EUA will remain  in effect (meaning this test can be used) for the duration of the  Covid-19 declaration under Section 564(b)(1) of the Act, 21  U.S.C. section 360bbb-3(b)(1), unless the authorization is  terminated or revoked. Performed at [facility], [address] [place], [place] [address]   Urine culture     Status: Abnormal (Preliminary result)   Collection Time: 05/31/20  7:43 PM   Specimen: Urine, Random  Result Value Ref Range Status   Specimen Description   Final    URINE, RANDOM Performed at [facility], [address], [place], [place] [address]    Special Requests   Final    NONE Performed at [facility], [address] [place], [place] [address]    Culture (A)  Final    >=100,000 COLONIES/mL ESCHERICHIA COLI SUSCEPTIBILITIES TO FOLLOW Performed at [facility], [place] [address], [place], [place] [address]    Report Status PENDING  Incomplete    Radiology Reports DG Chest 2 View  Result Date: 05/31/2020 CLINICAL DATA:  Altered mental status. EXAM: CHEST - 2 VIEW COMPARISON:  November 06, 2017 FINDINGS: The heart size and mediastinal contours are within normal limits. Both lungs are clear. The visualized skeletal structures are unremarkable. IMPRESSION: No active cardiopulmonary disease. Electronically Signed   By: Virgina Norfolk M.D.   On: 05/31/2020 17:33   CT HEAD WO CONTRAST  Result Date: 06/01/2020 CLINICAL DATA:  Slurred speech EXAM: CT HEAD WITHOUT CONTRAST TECHNIQUE: Contiguous axial images were obtained from the base of the skull through the vertex without intravenous contrast. COMPARISON:  May 31, 2020 FINDINGS: Brain: No evidence of acute territorial infarction,  hemorrhage, hydrocephalus,extra-axial collection or mass lesion/mass effect. There is dilatation the ventricles and sulci consistent with age-related atrophy. Low-attenuation changes in the deep white matter consistent with small vessel ischemia. Bilateral basal ganglia and left occipital lobe lacunar infarcts are noted. Vascular: No hyperdense vessel or unexpected calcification. Skull: The skull is intact. No fracture or focal lesion identified. Sinuses/Orbits: The visualized paranasal sinuses and mastoid air cells are clear. The orbits and globes intact. Other: None IMPRESSION: No acute intracranial abnormality. Findings consistent with age related atrophy and chronic small vessel ischemia Electronically Signed   By: Prudencio Pair M.D.   On: 06/01/2020 02:56   CT Head Wo Contrast  Result Date: 05/31/2020 CLINICAL DATA:  Mental status change EXAM: CT HEAD WITHOUT CONTRAST TECHNIQUE: Contiguous axial images were obtained from the base of the skull through the vertex without intravenous contrast. COMPARISON:  03/15/2020 FINDINGS: Brain: No evidence of acute infarction, hemorrhage, hydrocephalus, extra-axial collection or mass lesion/mass effect. Periventricular and deep white matter hypodensity. Lacunar infarctions of the bilateral basal ganglia. Vascular: No hyperdense vessel or unexpected calcification. Skull: Normal. Negative for fracture or focal lesion. Sinuses/Orbits: No acute finding. Other: None. IMPRESSION: 1.  No acute intracranial pathology. 2. Small-vessel white matter disease and nonacute lacunar infarctions of the bilateral basal ganglia. Electronically Signed   By: Eddie Candle M.D.   On: 05/31/2020 17:06    SIGNED: Deatra James, MD, FACP, FHM. [facility],  Pager (please use [email or web address] to page/text)  If 7PM-7AM, please contact night-coverage [email or web address] 06/02/2020, 12:26 PM

## 2020-06-02 NOTE — ED Notes (Signed)
Pt sitting up in bed, eating lunch, no complaints at this time, call light within reach

## 2020-06-02 NOTE — ED Notes (Signed)
Pt bed alarm going off. This RN and Evelena Peat, EDT at bedside. Pt removed gown and covers attempting to exit bed. Pt stating she needs help trying to get into the bed. This RN explained to pt that she is already in bed. Pt assisted back in bed and covered up at this time. [place] remains in place. Bed alarm reset

## 2020-06-02 NOTE — ED Notes (Signed)
This RN to bedside with mepilex that patient requested due to sore spot on her buttocks, pt noted to be resting in bed with eyes closed, respirations even and unlabored at this time.

## 2020-06-03 DIAGNOSIS — R410 Disorientation, unspecified: Secondary | ICD-10-CM | POA: Diagnosis not present

## 2020-06-03 DIAGNOSIS — R4182 Altered mental status, unspecified: Secondary | ICD-10-CM | POA: Diagnosis not present

## 2020-06-03 DIAGNOSIS — N3090 Cystitis, unspecified without hematuria: Secondary | ICD-10-CM | POA: Diagnosis not present

## 2020-06-03 DIAGNOSIS — E538 Deficiency of other specified B group vitamins: Secondary | ICD-10-CM | POA: Diagnosis not present

## 2020-06-03 LAB — GLUCOSE, CAPILLARY
Glucose-Capillary: 135 mg/dL — ABNORMAL HIGH (ref 70–99)
Glucose-Capillary: 161 mg/dL — ABNORMAL HIGH (ref 70–99)

## 2020-06-03 LAB — URINE CULTURE: Culture: >=100000 — AB

## 2020-06-03 MED ORDER — CIPROFLOXACIN HCL 500 MG PO TABS: 500.0000 mg | ORAL_TABLET | Freq: Two times a day (BID) | ORAL | 0 refills | Status: AC

## 2020-06-03 MED ORDER — LAMOTRIGINE 100 MG PO TABS: 100.0000 mg | ORAL_TABLET | Freq: Every day | ORAL | 0 refills | Status: AC

## 2020-06-03 MED ORDER — RISAQUAD PO CAPS
2.0000 | ORAL_CAPSULE | Freq: Three times a day (TID) | ORAL | Status: DC
  Administered 2020-06-03: 2 via ORAL
  Filled 2020-06-03: qty 2

## 2020-06-03 MED ORDER — POTASSIUM CHLORIDE CRYS ER 20 MEQ PO TBCR
40.0000 meq | EXTENDED_RELEASE_TABLET | Freq: Once | ORAL | Status: AC
  Administered 2020-06-03: 40 meq via ORAL
  Filled 2020-06-03: qty 2

## 2020-06-03 MED ORDER — RISAQUAD PO CAPS: 2.0000 | ORAL_CAPSULE | Freq: Three times a day (TID) | ORAL | 0 refills | Status: AC

## 2020-06-03 MED ORDER — HYDROCODONE-ACETAMINOPHEN 5-325 MG PO TABS
1.0000 | ORAL_TABLET | Freq: Four times a day (QID) | ORAL | 0 refills | Status: AC | PRN
Start: 2020-06-03 — End: ?

## 2020-06-03 NOTE — Progress Notes (Signed)
Tried calling the family multiple times since yesterday Twice in the morning and multiple times now Before numbers listed for Mis. Myrtie Cruise [phone]  The just rings does not allow to leave a message...  Dr. Roger Shelter

## 2020-06-03 NOTE — Progress Notes (Signed)
Report called to [facility] pt verbalized understanding of dc ok per md to have transport take patient at this time.

## 2020-06-03 NOTE — Progress Notes (Signed)
Called the patient's daughter again at 4:30 PM Myrtie Cruise Daughter [phone]  She is stating that she does not have any missed calls.   All the patient's plan of care and discharge was discussed with her in detail. It looks like someone has entered on the problem list Alzheimer's due to Parkinson disease which is not consistent with her findings on this admission.  All of her questions were answered to the best of my knowledge.   Dr. Roger Shelter

## 2020-06-03 NOTE — Progress Notes (Addendum)
  Have been answering a all the medical staff's secure chats which started at 9:00 this morning, and regarding discharge between 2:24 and 4:45 returning answer to the questions.  Provided the medical staff with my cell phone number for direct contact....  Also return a call to [phone] left a message.   RN Isaias Cowman left a secure chat very early stating that she is " I am done with the patient and have been done."    Dr. Roger Shelter

## 2020-06-03 NOTE — Discharge Summary (Addendum)
Physician Discharge Summary [facility]    Patient: Kristen Guerra                   Admit date: 05/31/2020   DOB: 1941-06-28             Discharge date:06/03/2020/9:06 AM JSE:[id number]                          PCP: Idelle Crouch, MD  Disposition: SNF   Recommendations for Outpatient Follow-up:   . Follow up: in 1 week  Discharge Condition: Stable   Code Status:   Code Status: Full Code  Diet recommendation: Regular healthy diet   Discharge Diagnoses:    Principal Problem:   AMS (altered mental status) Active Problems:   UTI (urinary tract infection)   GERD (gastroesophageal reflux disease)   HTN (hypertension)   HLD (hyperlipidemia)   Diabetes ([place])   B12 deficiency   Dementia due to Parkinson's disease with behavioral disturbance ([place])   History of Present Illness/ Hospital Course Kathleen Argue Summary:   Per HPI KatherineSwanneris a65 y.o. whitepleasantly confused Caucasian femalecoming from home,with a known history oftype 2 diabetes mellitus, hypertension, dyslipidemia, depression, PSVT, sleep apnea, CVA and GERD, who presented to the emergency room with acute onset of altered mental status.   ED;  Vitals were stable except for elevated blood pressure 170/83 Labs: Unremarkable Negative influenza antigens and COVID-19 PCR.  Urinalysis was positive for UTI.  Noncontrasted head CT scan revealed no acute intracranial normalities.  EKG showed sinus rhythm with rate of 75 with left bundle branch block that is old compared to 03/15/2020.  The patient was given a gram of IV Rocephin and 2 mg of IV Haldol by the ER physician as well as 500 mill IV lactated Ringer bolus. She will be admitted to a medical bed for further evaluation and management.     Acute encephalopathy like secondary to urinary tract infection and possibly polypharmacy.  underlying dementia -Remained confused with less agitation -Alert oriented x2 only to  herself -Back to baseline   -We will continue  IV antibiotic therapy with Rocephin. -Urine culture >> growing greater 100 K E. Coli pansensitive  -Switch antibiotic to p.o. ciprofloxacin for 5 more days this will conclude 7 days of antibiotic coverage    Uncontrolled essential hypertension.This could be secondary to her agitation initially. -We will continue Toprol-XL. -Remained stable   Dyslipidemia. -Continue statins  Type 2 diabetes mellitus with peripheral neuropathy. -We will check her blood sugar QA CHS, with SSI coverage NovoLog -Resume home regimen  Cognitive dysfunction/dementia. -We will continue her Aricept. -Confused -Less behavior changes, less agitated today  Vitamin B12 deficiency. -We will continue vitamin B12.  History of CVA. -We will continue her Plavix and aspirin. --Stable with chronic debility  Depression. -We will continue Wellbutrin XL and trazodone at night. We will hold off her Cymbalta. -Lamictal will be continued.      Nutritional status:   Cultures; Urine Culture  >>> greater 100 K colonies E. coli-pansensitive Respiratory panel influenza AB negative SARS-CoV-2 negative  Antimicrobials: 05/31/2020 IV Rocephin>>  06/03/2020 06/04/2020 initiating p.o. ciprofloxacin x5 more days    Consultants: None    Code Status:   Code Status: Full Code Family Communication: No family member present at bedside- attempt will be made to update  The above findings and plan of care has been discussed with patient (and family )  in detail,  they expressed  understanding and agreement of above. -Advance care planning has been discussed.   Admission status:   Status is: Inpatient  Remains inpatient appropriate because:Inpatient level of care appropriate due to severity of illness   Dispo:  Anticipated d/c is to: SNF      Discharge Instructions:   Discharge Instructions    Activity as  tolerated - No restrictions   Complete by: As directed    Diet - low sodium heart healthy   Complete by: As directed    Discharge instructions   Complete by: As directed    Increase activity slowly   Complete by: As directed        Medication List    TAKE these medications   acidophilus Caps capsule Take 2 capsules by mouth 3 (three) times daily for 7 days.   aspirin EC 81 MG tablet Take 81 mg by mouth daily.   atorvastatin 40 MG tablet Commonly known as: LIPITOR Take 40 mg by mouth daily.   ciprofloxacin 500 MG tablet Commonly known as: Cipro Take 1 tablet (500 mg total) by mouth 2 (two) times daily for 5 days.   clopidogrel 75 MG tablet Commonly known as: PLAVIX Take 75 mg by mouth daily.   cyanocobalamin 1000 MCG tablet Take 1,000 mcg by mouth daily.   donepezil 5 MG tablet Commonly known as: ARICEPT Take 5 mg by mouth daily.   DULoxetine 60 MG capsule Commonly known as: CYMBALTA Take 60 mg by mouth daily.   gabapentin 300 MG capsule Commonly known as: NEURONTIN Take 300 mg by mouth in the morning, at noon, and at bedtime.   GENTEAL TEARS OP Place 1 drop into both ears 2 (two) times daily as needed (dry eyes).   HYDROcodone-acetaminophen 5-325 MG tablet Commonly known as: Norco Take 1 tablet by mouth every 6 (six) hours as needed for up to 6 doses for moderate pain.   hydrOXYzine 10 MG tablet Commonly known as: ATARAX/VISTARIL Take 10 mg by mouth at bedtime.   insulin glargine 100 unit/mL Sopn Commonly known as: LANTUS Inject 30 Units into the skin at bedtime.   lamoTRIgine 100 MG tablet Commonly known as: LAMICTAL Take 1 tablet (100 mg total) by mouth daily.   meloxicam 7.5 MG tablet Commonly known as: MOBIC Take 7.5 mg by mouth daily.   metoprolol succinate 25 MG 24 hr tablet Commonly known as: TOPROL-XL Take 25 mg by mouth daily.   ONE TOUCH ULTRA TEST test strip Generic drug: glucose blood   pantoprazole 40 MG tablet Commonly known  as: PROTONIX Take 40 mg by mouth daily.   traZODone 50 MG tablet Commonly known as: DESYREL Take 50 mg by mouth at bedtime.   Trulicity 1.5 DJ/4.9FW Sopn Generic drug: Dulaglutide Inject 1.5 mg into the skin daily.   VITAMIN D PO Take 1,000 Units by mouth daily.       Allergies  Allergen Reactions  . Amoxicillin Itching and Other (See Comments)    Reaction: dizziness Has patient had a PCN reaction causing immediate rash, facial/tongue/throat swelling, SOB or lightheadedness with hypotension: No Has patient had a PCN reaction causing severe rash involving mucus membranes or skin necrosis: No Has patient had a PCN reaction that required hospitalization: No Has patient had a PCN reaction occurring within the last 10 years: Yes If all of the above answers are "NO", then may proceed with Cephalosporin use.   . Lidocaine Itching  . Penicillin G Itching     Procedures /Studies:   DG  Chest 2 View  Result Date: 05/31/2020 CLINICAL DATA:  Altered mental status. EXAM: CHEST - 2 VIEW COMPARISON:  November 06, 2017 FINDINGS: The heart size and mediastinal contours are within normal limits. Both lungs are clear. The visualized skeletal structures are unremarkable. IMPRESSION: No active cardiopulmonary disease. Electronically Signed   By: Virgina Norfolk M.D.   On: 05/31/2020 17:33   CT HEAD WO CONTRAST  Result Date: 06/01/2020 CLINICAL DATA:  Slurred speech EXAM: CT HEAD WITHOUT CONTRAST TECHNIQUE: Contiguous axial images were obtained from the base of the skull through the vertex without intravenous contrast. COMPARISON:  May 31, 2020 FINDINGS: Brain: No evidence of acute territorial infarction, hemorrhage, hydrocephalus,extra-axial collection or mass lesion/mass effect. There is dilatation the ventricles and sulci consistent with age-related atrophy. Low-attenuation changes in the deep white matter consistent with small vessel ischemia. Bilateral basal ganglia and left occipital lobe  lacunar infarcts are noted. Vascular: No hyperdense vessel or unexpected calcification. Skull: The skull is intact. No fracture or focal lesion identified. Sinuses/Orbits: The visualized paranasal sinuses and mastoid air cells are clear. The orbits and globes intact. Other: None IMPRESSION: No acute intracranial abnormality. Findings consistent with age related atrophy and chronic small vessel ischemia Electronically Signed   By: Prudencio Pair M.D.   On: 06/01/2020 02:56   CT Head Wo Contrast  Result Date: 05/31/2020 CLINICAL DATA:  Mental status change EXAM: CT HEAD WITHOUT CONTRAST TECHNIQUE: Contiguous axial images were obtained from the base of the skull through the vertex without intravenous contrast. COMPARISON:  03/15/2020 FINDINGS: Brain: No evidence of acute infarction, hemorrhage, hydrocephalus, extra-axial collection or mass lesion/mass effect. Periventricular and deep white matter hypodensity. Lacunar infarctions of the bilateral basal ganglia. Vascular: No hyperdense vessel or unexpected calcification. Skull: Normal. Negative for fracture or focal lesion. Sinuses/Orbits: No acute finding. Other: None. IMPRESSION: 1.  No acute intracranial pathology. 2. Small-vessel white matter disease and nonacute lacunar infarctions of the bilateral basal ganglia. Electronically Signed   By: Eddie Candle M.D.   On: 05/31/2020 17:06    Subjective:   Patient was seen and examined 06/03/2020, 9:06 AM Patient stable today. No acute distress.  No issues overnight Stable for discharge.  Discharge Exam:    Vitals:   06/02/20 2108 06/02/20 2310 06/03/20 0440 06/03/20 0738  BP: (!) 168/58 (!) 164/62 (!) 157/80 (!) 158/71  Pulse: 71 67 70 72  Resp: 20 14 16 17   Temp: (!) 96.6 F (35.9 C) 98.3 F (36.8 C) 98.3 F (36.8 C) 97.7 F (36.5 C)  TempSrc: Axillary  Oral Oral  SpO2: 97% 96% 97% 98%  Weight:      Height:        General: Pt lying comfortably in bed & appears in no obvious  distress. Cardiovascular: S1 & S2 heard, RRR, S1/S2 +. No murmurs, rubs, gallops or clicks. No JVD or pedal edema. Respiratory: Clear to auscultation without wheezing, rhonchi or crackles. No increased work of breathing. Abdominal:  Non-distended, non-tender & soft. No organomegaly or masses appreciated. Normal bowel sounds heard. CNS: Alert and oriented. No focal deficits. Extremities: no edema, no cyanosis    The results of significant diagnostics from this hospitalization (including imaging, microbiology, ancillary and laboratory) are listed below for reference.      Microbiology:   Recent Results (from the past 240 hour(s))  Respiratory Panel by RT PCR (Flu A&B, Covid) - Nasopharyngeal Swab     Status: None   Collection Time: 05/31/20  4:46 PM   Specimen: Nasopharyngeal  Swab  Result Value Ref Range Status   SARS Coronavirus 2 by RT PCR NEGATIVE NEGATIVE Final    Comment: (NOTE) SARS-CoV-2 target nucleic acids are NOT DETECTED.  The SARS-CoV-2 RNA is generally detectable in upper respiratoy specimens during the acute phase of infection. The lowest concentration of SARS-CoV-2 viral copies this assay can detect is 131 copies/mL. A negative result does not preclude SARS-Cov-2 infection and should not be used as the sole basis for treatment or other patient management decisions. A negative result may occur with  improper specimen collection/handling, submission of specimen other than nasopharyngeal swab, presence of viral mutation(s) within the areas targeted by this assay, and inadequate number of viral copies (<131 copies/mL). A negative result must be combined with clinical observations, patient history, and epidemiological information. The expected result is Negative.  Fact Sheet for Patients:  PinkCheek.be  Fact Sheet for Healthcare Providers:  GravelBags.it  This test is no t yet approved or cleared by the [place] FDA and  has been authorized for detection and/or diagnosis of SARS-CoV-2 by FDA under an Emergency Use Authorization (EUA). This EUA will remain  in effect (meaning this test can be used) for the duration of the COVID-19 declaration under Section 564(b)(1) of the Act, 21 U.S.C. section 360bbb-3(b)(1), unless the authorization is terminated or revoked sooner.     Influenza A by PCR NEGATIVE NEGATIVE Final   Influenza B by PCR NEGATIVE NEGATIVE Final    Comment: (NOTE) The Xpert Xpress SARS-CoV-2/FLU/RSV assay is intended as an aid in  the diagnosis of influenza from Nasopharyngeal swab specimens and  should not be used as a sole basis for treatment. Nasal washings and  aspirates are unacceptable for Xpert Xpress SARS-CoV-2/FLU/RSV  testing.  Fact Sheet for Patients: PinkCheek.be  Fact Sheet for Healthcare Providers: GravelBags.it  This test is not yet approved or cleared by the [place] FDA and  has been authorized for detection and/or diagnosis of SARS-CoV-2 by  FDA under an Emergency Use Authorization (EUA). This EUA will remain  in effect (meaning this test can be used) for the duration of the  Covid-19 declaration under Section 564(b)(1) of the Act, 21  U.S.C. section 360bbb-3(b)(1), unless the authorization is  terminated or revoked. Performed at [facility], [address] [place], [place] [address]   Urine culture     Status: Abnormal   Collection Time: 05/31/20  7:43 PM   Specimen: Urine, Random  Result Value Ref Range Status   Specimen Description   Final    URINE, RANDOM Performed at [facility], [address] [place], [place] [address]    Special Requests   Final    NONE Performed at [facility], [place], [place] [address]    Culture >=100,000 COLONIES/mL ESCHERICHIA COLI (A)  Final   Report Status 06/03/2020 FINAL  Final   Organism ID,  Bacteria ESCHERICHIA COLI (A)  Final      Susceptibility   Escherichia coli - MIC*    AMPICILLIN 8 SENSITIVE Sensitive     CEFAZOLIN <=4 SENSITIVE Sensitive     CEFTRIAXONE <=0.25 SENSITIVE Sensitive     CIPROFLOXACIN <=0.25 SENSITIVE Sensitive     GENTAMICIN <=1 SENSITIVE Sensitive     IMIPENEM <=0.25 SENSITIVE Sensitive     NITROFURANTOIN <=16 SENSITIVE Sensitive     TRIMETH/SULFA <=20 SENSITIVE Sensitive     AMPICILLIN/SULBACTAM 4 SENSITIVE Sensitive     PIP/TAZO <=4 SENSITIVE Sensitive     * >=  100,000 COLONIES/mL ESCHERICHIA COLI     Labs:   CBC: Recent Labs  Lab 05/31/20 1616 06/01/20 0422  WBC 10.8* 10.6*  NEUTROABS 7.5  --   HGB 15.1* 12.3  HCT 48.6* 40.4  MCV 83.6 85.6  PLT 487* 389   Basic Metabolic Panel: Recent Labs  Lab 05/31/20 1616 06/01/20 0422  NA 136 142  K 3.6 2.9*  CL 100 112*  CO2 30 23  GLUCOSE 153* 152*  BUN 15 9  CREATININE 0.91 0.75  CALCIUM 9.8 7.2*  MG  --  1.6*   Liver Function Tests: Recent Labs  Lab 05/31/20 1616  AST 19  ALT 15  ALKPHOS 56  BILITOT 0.6  PROT 6.6  ALBUMIN 3.9   BNP (last 3 results) No results for input(s): BNP in the last 8760 hours. Cardiac Enzymes: No results for input(s): CKTOTAL, CKMB, CKMBINDEX, TROPONINI in the last 168 hours. CBG: Recent Labs  Lab 06/01/20 2116 06/02/20 2115 06/03/20 0740  GLUCAP 166* 142* 135*   Hgb A1c No results for input(s): HGBA1C in the last 72 hours. Lipid Profile No results for input(s): CHOL, HDL, LDLCALC, TRIG, CHOLHDL, LDLDIRECT in the last 72 hours. Thyroid function studies Recent Labs    05/31/20 1616  TSH 1.696   Anemia work up No results for input(s): VITAMINB12, FOLATE, FERRITIN, TIBC, IRON, RETICCTPCT in the last 72 hours. Urinalysis    Component Value Date/Time   COLORURINE YELLOW (A) 05/31/2020 1943   APPEARANCEUR HAZY (A) 05/31/2020 1943   APPEARANCEUR CLOUDY 12/19/2013 1050   LABSPEC 1.021 05/31/2020 1943   LABSPEC 1.025 12/19/2013 1050    PHURINE 5.0 05/31/2020 1943   GLUCOSEU >=500 (A) 05/31/2020 1943   GLUCOSEU NEGATIVE 12/19/2013 [place] 05/31/2020 1943   BILIRUBINUR NEGATIVE 05/31/2020 1943   BILIRUBINUR 1+ 12/19/2013 1050   KETONESUR NEGATIVE 05/31/2020 1943   PROTEINUR 30 (A) 05/31/2020 1943   NITRITE POSITIVE (A) 05/31/2020 1943   LEUKOCYTESUR SMALL (A) 05/31/2020 1943   LEUKOCYTESUR TRACE 12/19/2013 1050         Time coordinating discharge: Over 45 minutes  SIGNED: Deatra James, MD, FACP, FHM. [facility],  Please use [email or web address] to Page If 7PM-7AM, please contact night-coverage [email or web address], Password [facility] 06/03/2020, 9:06 AM

## 2020-06-03 NOTE — TOC Progression Note (Signed)
Transition of Care [facility]) - Progression Note    Patient Details  Name: Kristen Guerra MRN: [id number] Date of Birth: November 23, 1940  [facility] CM/SW Contact  Beverly Sessions, RN Phone Number: 06/03/2020, 1:49 PM  Clinical Narrative:      [facility] is able to offer.  Daugther Ms Ouida Sills has accepted bed.  DC information sent in the [place] started through portal  Expected Discharge Plan: [place] Barriers to Discharge: Continued Medical Work up  Expected Discharge Plan and Services Expected Discharge Plan: [place] Choice: [place] arrangements for the past 2 months: [place] Expected Discharge Date: 06/03/20               DME Arranged: N/A DME Agency: NA       HH Arranged: NA HH Agency: NA         Social Determinants of Health (SDOH) Interventions    Readmission Risk Interventions No flowsheet data found.

## 2020-06-03 NOTE — Evaluation (Signed)
Occupational Therapy Evaluation Patient Details Name: Kristen Guerra MRN: [id number] DOB: 1941/08/03 Today's Date: 06/03/2020    History of Present Illness 79 y.o. pleasantly confused Caucasian female coming from home, with a known history of type 2 diabetes mellitus, hypertension, dyslipidemia, depression, PSVT, sleep apnea, CVA and GERD, who presented to the emergency room with acute onset of altered mental status.  Her neighbors called EMS as they were concerned that the patient was more confused than usual.  She denies any headache or dizziness or blurred vision.  She stated however that she bumped her head while taking shower this morning with no presyncope or syncope.  She denied any tinnitus or vertigo.  She has been having dry cough over the last couple of days.  She denied any dyspnea or wheezing.  She admitted to dysuria without urinary frequency urgency or flank pain or hematuria.  No fever or chills.  No chest pain or palpitations.  No nausea or vomiting or diarrhea or abdominal pain.  She denies any chest pain or palpitations.   Clinical Impression   Patient presenting with decreased I in self care, balance, functional mobility/transfers, endurance, and safety awareness. Pt with cognitive deficits this session requiring min- mod cuing for sequencing,safety awareness, and initiation. Pt was oriented to self and location.  Patient reports living in ILF PTA. She reports she is independent with cooking, cleaning tasks, and driving. She also reports she gets into tub for bathing tasks. However, pt is poor historian this session so unsure of accuracy.Patient currently functioning at mod - total A for self care tasks. Pt required max A to stand from bed and mod-max A for dynamic sitting balance. She reports not having family assistance at discharge. Patient will benefit from acute OT to increase overall independence in the areas of ADLs, functional mobility, and safety awareness in order to  safely discharge to next venue of care.    Follow Up Recommendations  SNF;Supervision/Assistance - 24 hour    Equipment Recommendations  Other (comment) (defer to next venue of care)       Precautions / Restrictions Precautions Precautions: Fall      Mobility Bed Mobility Overal bed mobility: Needs Assistance Bed Mobility: Supine to Sit;Sit to Supine     Supine to sit: Max assist Sit to supine: Mod assist   General bed mobility comments: from flat bed pt needing assistance with BLEs and trunk to EOB. Pt needing assistance with B LEs during sit >supine. Mod cuing for hand placement and technique  Transfers Overall transfer level: Needs assistance Equipment used: None Transfers: Sit to/from Stand Sit to Stand: Max assist              Balance Overall balance assessment: Needs assistance Sitting-balance support: Feet supported Sitting balance-Leahy Scale: Poor Sitting balance - Comments: mod - max A dynamic sitting balance Postural control: Posterior lean   Standing balance-Leahy Scale: Zero           ADL either performed or assessed with clinical judgement   ADL Overall ADL's : Needs assistance/impaired      General ADL Comments: Pt performing grooming tasks from bed level with set up A and min cuing to initiate. Pt seated on EOB to donn/doff hospital gown and needed max A for dynamic sitting balance. Pt able to proper herself for static sitting balance for 3 minutes with min A     Vision Baseline Vision/History: Wears glasses Wears Glasses: Reading only Patient Visual Report: No change from baseline  Pertinent Vitals/Pain Pain Assessment: Faces Faces Pain Scale: No hurt     Hand Dominance Right   Extremity/Trunk Assessment Upper Extremity Assessment Upper Extremity Assessment: Overall WFL for tasks assessed   Lower Extremity Assessment Lower Extremity Assessment: Defer to PT evaluation       Communication  Communication Communication: No difficulties   Cognition Arousal/Alertness: Awake/alert Behavior During Therapy: Restless Overall Cognitive Status: Impaired/Different from baseline Area of Impairment: Orientation;Attention;Memory;Following commands;Safety/judgement;Awareness;Problem solving      Orientation Level: Person;Place Current Attention Level: Sustained Memory: Decreased short-term memory Following Commands: Follows one step commands inconsistently Safety/Judgement: Decreased awareness of safety;Decreased awareness of deficits Awareness: Intellectual Problem Solving: Slow processing;Decreased initiation;Requires verbal cues;Difficulty sequencing General Comments: Pt needing min - mod multimodal cuing for initiation and safety awareness. Pt reports year as "19 20 81"              [place] expects to be discharged to:: Private residence Living Arrangements: Alone Available Help at Discharge: Family;Available PRN/intermittently Type of Home: Independent living facility Home Access: Stairs to enter Entrance Stairs-Number of Steps: unsure      Home Equipment: Walker - 4 wheels;Walker - 2 wheels;Cane - single point          Prior Functioning/Environment Level of Independence: Independent with assistive device(s)        Comments: Pt reports living in ILF. Pt is poor historian. Information given about home set up is different from what pt reports ~ 1 year ago from chart review. Pt reports using cane within the home and RW or rollator in community. She also reports she cooks, cleans, and still drives. She does to communituy dining for one meal each day.        OT Problem List: Decreased strength;Decreased cognition;Decreased safety awareness;Decreased activity tolerance;Impaired balance (sitting and/or standing);Cardiopulmonary status limiting activity      OT Treatment/Interventions: Self-care/ADL training;Therapeutic exercise;Therapeutic  activities;Cognitive remediation/compensation;Energy conservation;Patient/family education;Balance training;DME and/or AE instruction    OT Goals(Current goals can be found in the care plan section) Acute Rehab OT Goals Patient Stated Goal: to go home OT Goal Formulation: With patient Time For Goal Achievement: 06/17/20 Potential to Achieve Goals: Good ADL Goals Pt Will Perform Grooming: with supervision;standing Pt Will Transfer to Toilet: with min guard assist;ambulating Pt Will Perform Toileting - Clothing Manipulation and hygiene: with min guard assist;sit to/from stand  OT Frequency: Min 1X/week   Barriers to D/C: Decreased caregiver support  per pt report          AM-PAC OT "6 Clicks" Daily Activity     Outcome Measure Help from another person eating meals?: A Little Help from another person taking care of personal grooming?: A Little Help from another person toileting, which includes using toliet, bedpan, or urinal?: Total Help from another person bathing (including washing, rinsing, drying)?: A Lot Help from another person to put on and taking off regular upper body clothing?: A Lot Help from another person to put on and taking off regular lower body clothing?: Total 6 Click Score: 12   End of Session Nurse Communication: Mobility status;Precautions  Activity Tolerance: Patient tolerated treatment well Patient left: in bed;with call bell/phone within reach;with bed alarm set  OT Visit Diagnosis: Unsteadiness on feet (R26.81);Repeated falls (R29.6);Muscle weakness (generalized) (M62.81);History of falling (Z91.81)                Time: 5361-4431 OT Time Calculation (min): 21 min Charges:  OT General Charges $OT Visit: 1 Visit OT Evaluation $OT Eval Moderate  Complexity: 1 Mod OT Treatments $Self Care/Home Management : 8-22 mins  Darleen Crocker, MS, OTR/L , CBIS ascom [phone]  06/03/20, 11:18 AM

## 2020-06-03 NOTE — NC FL2 (Signed)
[place] LEVEL OF CARE SCREENING TOOL     IDENTIFICATION  Patient Name: Kristen Guerra Birthdate: Jun 06, 1941 Sex: female Admission Date (Current Location): 05/31/2020  [facility] and [place] Number:  Engineering geologist and Address:         Provider Number: [id number]  Attending Physician Name and Address:  Deatra James, MD  Relative Name and Phone Number:       Current Level of Care: Hospital Recommended Level of Care: [place] Prior Approval Number:    Date Approved/Denied:   PASRR Number: [id number]  Discharge Plan: SNF    Current Diagnoses: Patient Active Problem List   Diagnosis Date Noted  . Dementia due to Parkinson's disease with behavioral disturbance ([place]) 06/01/2020  . AMS (altered mental status) 05/31/2020  . Polycythemia 04/04/2020  . Anxiety 02/20/2020  . B12 deficiency 02/20/2020  . Chronic back pain 02/20/2020  . Chronic vaginitis 02/20/2020  . Fibrocystic breast disease 02/20/2020  . History of colonic polyps 02/20/2020  . Migraine headache 02/20/2020  . Non-cardiac chest pain 02/20/2020  . Senile nuclear sclerosis 02/20/2020  . Sleep apnea 02/20/2020  . SUI (stress urinary incontinence, female) 02/20/2020  . Chronic pain syndrome 02/20/2020  . Pharmacologic therapy 02/20/2020  . Disorder of skeletal system 02/20/2020  . Problems influencing health status 02/20/2020  . LBBB (left bundle branch block) 12/21/2019  . Preop cardiovascular exam 12/21/2019  . Long-term insulin use ([place]) 11/16/2018  . Post-menopausal 11/16/2018  . Mixed hyperlipidemia 11/16/2018  . DM type 2 with diabetic peripheral neuropathy ([place]) 07/21/2018  . Hx of low back pain 02/28/2018  . Back pain without sciatica 02/01/2018  . History of compression fracture of spine 02/01/2018  . Loss of memory 02/01/2018  . Mid back pain on right side 11/09/2017  . Splenomegaly 11/09/2017  . Subarachnoid hemorrhage following injury, no loss  of consciousness ([place]) 05/01/2017  . 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 Lightheadedness 04/26/2017  . Weakness 04/22/2017  . History of stroke 03/17/2017  . Dizziness 02/27/2017  . UTI (urinary tract infection) 02/27/2017  . GERD (gastroesophageal reflux disease) 02/27/2017  . HTN (hypertension) 02/27/2017  . HLD (hyperlipidemia) 02/27/2017  . Diabetes ([place]) 02/27/2017  . Type 2 diabetes mellitus with hyperglycemia, with long-term current use of insulin ([place]) 09/03/2016  . Dysphagia 05/10/2014  . FH: colon cancer 05/10/2014  . Anemia 02/06/2014  . Vitamin D deficiency 02/06/2014  . Hyperlipidemia 02/06/2014  . Cataract 11/09/2013  . Depression 11/09/2013  . OAB (overactive bladder) 11/09/2013  . TIA (transient ischemic attack) 11/09/2013  . Type 2 diabetes mellitus without complications ([place]) 76/28/3151  . Gastro-esophageal reflux disease without esophagitis 11/09/2013  . Essential (primary) hypertension 11/09/2013  . Herpes zoster 03/30/2004    Orientation RESPIRATION BLADDER Height & Weight     Self, Place  Normal Incontinent, External catheter Weight: 63.5 kg Height:  5\' 4"  (162.6 cm)  BEHAVIORAL SYMPTOMS/MOOD NEUROLOGICAL BOWEL NUTRITION STATUS      Continent Diet (Heart Healthy)  AMBULATORY STATUS COMMUNICATION OF NEEDS Skin   Extensive Assist Verbally Normal                       Personal Care Assistance Level of Assistance              Functional Limitations Info             SPECIAL CARE FACTORS FREQUENCY  PT (By licensed PT), OT (By licensed OT)  Contractures Contractures Info: Not present    Additional Factors Info  Code Status, Allergies Code Status Info: Full Allergies Info: Amoxicillin, lidocaine, penicillin G           Current Medications (06/03/2020):  This is the current hospital active medication list Current Facility-Administered Medications  Medication Dose Route Frequency Provider Last Rate Last Admin  .  0.9 %  sodium chloride infusion   Intravenous Continuous Mansy, Jan A, MD 100 mL/hr at 06/03/20 0913 New Bag at 06/03/20 0913  . acetaminophen (TYLENOL) tablet 650 mg  650 mg Oral Q6H PRN Mansy, Jan A, MD       Or  . acetaminophen (TYLENOL) suppository 650 mg  650 mg Rectal Q6H PRN Mansy, Jan A, MD      . acidophilus (RISAQUAD) capsule 2 capsule  2 capsule Oral TID Skipper Cliche A, MD   2 capsule at 06/03/20 0915  . aspirin EC tablet 81 mg  81 mg Oral Daily Mansy, Jan A, MD   81 mg at 06/03/20 0915  . atorvastatin (LIPITOR) tablet 40 mg  40 mg Oral Daily Mansy, Jan A, MD   40 mg at 06/03/20 0915  . buPROPion (WELLBUTRIN XL) 24 hr tablet 150 mg  150 mg Oral Daily Lu Duffel, RPH   150 mg at 06/03/20 7902  . cefTRIAXone (ROCEPHIN) 1 g in sodium chloride 0.9 % 100 mL IVPB  1 g Intravenous Q24H Deatra James, MD   Stopped at 06/02/20 2229  . cholecalciferol (VITAMIN D3) tablet 1,000 Units  1,000 Units Oral Daily Mansy, Arvella Merles, MD   1,000 Units at 06/03/20 0915  . clopidogrel (PLAVIX) tablet 75 mg  75 mg Oral Daily Mansy, Jan A, MD   75 mg at 06/03/20 0916  . enoxaparin (LOVENOX) injection 40 mg  40 mg Subcutaneous Q24H Mansy, Jan A, MD   40 mg at 06/02/20 2200  . gabapentin (NEURONTIN) capsule 300 mg  300 mg Oral TID Mansy, Jan A, MD   300 mg at 06/03/20 0916  . haloperidol lactate (HALDOL) injection 1 mg  1 mg Intravenous Q6H PRN Skipper Cliche A, MD   1 mg at 06/01/20 1437  . HYDROcodone-acetaminophen (NORCO/VICODIN) 5-325 MG per tablet 1 tablet  1 tablet Oral Q6H PRN Mansy, Arvella Merles, MD   1 tablet at 06/03/20 0914  . insulin aspart (novoLOG) injection 0-5 Units  0-5 Units Subcutaneous QHS Ouma, Bing Neighbors, NP      . insulin aspart (novoLOG) injection 0-9 Units  0-9 Units Subcutaneous TID WC Lang Snow, NP   1 Units at 06/03/20 [phone]  . insulin glargine (LANTUS) injection 30 Units  30 Units Subcutaneous QHS Mansy, Jan A, MD   30 Units at 06/02/20 2200  . labetalol  (NORMODYNE) injection 20 mg  20 mg Intravenous Q3H PRN Mansy, Jan A, MD   20 mg at 06/01/20 2110  . lamoTRIgine (LAMICTAL) tablet 100 mg  100 mg Oral Daily Mansy, Jan A, MD   100 mg at 06/03/20 0914  . metoprolol succinate (TOPROL-XL) 24 hr tablet 25 mg  25 mg Oral Daily Mansy, Jan A, MD   25 mg at 06/03/20 0916  . ondansetron (ZOFRAN) tablet 4 mg  4 mg Oral Q6H PRN Mansy, Jan A, MD       Or  . ondansetron [facility]) injection 4 mg  4 mg Intravenous Q6H PRN Mansy, Jan A, MD      . pantoprazole (PROTONIX) EC tablet 40 mg  40  mg Oral Daily Mansy, Jan A, MD   40 mg at 06/03/20 0916  . polyvinyl alcohol (LIQUIFILM TEARS) 1.4 % ophthalmic solution   Both Eyes BID PRN Mansy, Jan A, MD      . traZODone (DESYREL) tablet 25 mg  25 mg Oral QHS PRN Mansy, Jan A, MD      . traZODone (DESYREL) tablet 50 mg  50 mg Oral QHS Mansy, Jan A, MD   50 mg at 06/02/20 2200  . vitamin B-12 (CYANOCOBALAMIN) tablet 1,000 mcg  1,000 mcg Oral Daily Mansy, Jan A, MD   1,000 mcg at 06/03/20 1595   Facility-Administered Medications Ordered in Other Encounters  Medication Dose Route Frequency Provider Last Rate Last Admin  . Chlorhexidine Gluconate Cloth 2 % PADS 6 each  6 each Topical Once Benjamine Sprague, DO         Discharge Medications: Please see discharge summary for a list of discharge medications.  Relevant Imaging Results:  Relevant Lab Results:   Additional Information ss [id number]  Beverly Sessions, RN

## 2020-06-03 NOTE — Care Management Important Message (Signed)
Important Message  Patient Details  Name: Kristen Guerra MRN: [id number] Date of Birth: [birth year]   Medicare Important Message Given:  Yes  Initial Medicare IM given by Patient Access Associate on 06/03/2020 at 11:05am.      Dannette Barbara 06/03/2020, 11:08 AM

## 2020-06-03 NOTE — TOC Progression Note (Signed)
Transition of Care [facility]) - Progression Note    Patient Details  Name: Kristen Guerra MRN: [id number] Date of Birth: [birth year]  [facility] CM/SW Contact  Beverly Sessions, RN Phone Number: 06/03/2020, 1:25 PM  Clinical Narrative:    PT has assessed patient and recommends SNF.  TOC confirmed with family they wish to pursue SNF.   Patient has existing PASRR Fl2 sent for signature.  Bed Search initiated.   Families first choice is [facility]    Expected Discharge Plan: [place] Barriers to Discharge: Continued Medical Work up  Expected Discharge Plan and Services Expected Discharge Plan: [place] Choice: [place] arrangements for the past 2 months: [place] Expected Discharge Date: 06/03/20               DME Arranged: N/A DME Agency: NA       HH Arranged: NA HH Agency: NA         Social Determinants of Health (SDOH) Interventions    Readmission Risk Interventions No flowsheet data found.

## 2020-06-03 NOTE — TOC Transition Note (Signed)
Transition of Care [facility]) - CM/SW Discharge Note   Patient Details  Name: LEVON PENNING MRN: [id number] Date of Birth: [birth year]  Transition of Care [facility]) CM/SW Contact:  Beverly Sessions, RN Phone Number: 06/03/2020, 3:17 PM   Clinical Narrative:     Insurance auth obtained.  Patient to discharge today  DC info sent in [place] Mrs Ouida Sills notified.  Requesting call from MD and RN.  Both were notified  EMS transport arranged for 4pm through first choice Bedside RN to call report EMS packet printed   Final next level of care: Skilled Nursing Facility Barriers to Discharge: No Barriers Identified   Patient Goals and CMS Choice Patient states their goals for this hospitalization and ongoing recovery are:: SNF for rehab. CMS Medicare.gov Compare Post Acute Care list provided to:: Patient Represenative (must comment) Rennie Natter (daughter)) Choice offered to / list presented to : Adult Children  Discharge Placement              Patient chooses bed at: [facility] Patient to be transferred to facility by: First choice Name of family member notified: Daughter - Ouida Sills Patient and family notified of of transfer: 06/03/20  Discharge Plan and Services     Post Acute Care Choice: [place]          DME Arranged: N/A DME Agency: NA       HH Arranged: NA HH Agency: NA        Social Determinants of Health (SDOH) Interventions     Readmission Risk Interventions No flowsheet data found.

## 2020-06-03 NOTE — Evaluation (Signed)
Physical Therapy Evaluation Patient Details Name: Kristen Guerra MRN: [id number] DOB: 1940-09-15 Today's Date: 06/03/2020   History of Present Illness  79 y.o. pleasantly confused Caucasian female coming from home, with a known history of type 2 diabetes mellitus, hypertension, dyslipidemia, depression, PSVT, sleep apnea, CVA and GERD, who presented to the emergency room with acute onset of altered mental status.  Her neighbors called EMS as they were concerned that the patient was more confused than usual.  She denies any headache or dizziness or blurred vision.  She stated however that she bumped her head while taking shower this morning with no presyncope or syncope.  She denied any tinnitus or vertigo.  She has been having dry cough over the last couple of days.  She denied any dyspnea or wheezing.  She admitted to dysuria without urinary frequency urgency or flank pain or hematuria.  No fever or chills.  No chest pain or palpitations.  No nausea or vomiting or diarrhea or abdominal pain.  She denies any chest pain or palpitations.  Clinical Impression  Pt lying in bed upon arrival and reports feeling a little upset about not being able to get out of bed and tv not working. Pt remained pleasantly confused throughout session. Unclear on accuracy of answers regarding PLOF and AD utilized.  Pt attempted self initiation of supine to sit then paused requiring max verbal and tactile cues for sequencing of tasks. Max A for truncal elevation from elevated HOB. Once seated edge of bed, pt with posterior LOB requiring max A for static sitting balance. Placed RW on floor in front of pt and verbal cues for pt to hold onto to promote anterior lean. Pt able to hold with SBA for safety and then able to remove one hand with no posterior LOB noted. Pt with good carryover of cues for anterior lean prior to standing. Pt performed sit to stand transfer with max A for boosting hips with max A for upright balance while  sidestepping 2 feet to recliner chair. Pt performed therex seated in recliner chair.  Multimodal cues required for all tasks and mobility today. Pt presents today with decreased cognition, strength, balance, functional activity tolerance, and functional mobility. Pt would benefit from skilled PT during acute stay to address aforementioned deficits and STR at discharge to optimize return to PLOF and maximize functional mobility.     Follow Up Recommendations SNF;Supervision - Intermittent    Equipment Recommendations  Rolling walker with 5" wheels;Other (comment) (unclear if pt has AD despite reports of using RW)    Recommendations for Other Services       Precautions / Restrictions Precautions Precautions: Fall Restrictions Weight Bearing Restrictions: No      Mobility  Bed Mobility Overal bed mobility: Needs Assistance Bed Mobility: Supine to Sit     Supine to sit: Max assist;HOB elevated Sit to supine: Mod assist   General bed mobility comments: HOB elevated; pt required mod-max A for truncal elevation into sitting with verbal and tactile cues for BLE movement to and over edge of bed  Transfers Overall transfer level: Needs assistance Equipment used: None Transfers: Sit to/from Stand Sit to Stand: Max assist         General transfer comment: Verbal cues for anterior truncal lean then max A for sit <> stand transfer for boosting hips, balance, and eccentric control on descent.  Ambulation/Gait Ambulation/Gait assistance: Max assist Gait Distance (Feet): 2 Feet Assistive device: None Gait Pattern/deviations: Decreased step length - right;Decreased step  length - left;Narrow base of support Gait velocity: decreased   General Gait Details: Pt sidestepped 2 feet with max A from anterior approach for balance while upright. Increased verbal cues for continued stepping, sequencing of tasks, and upright posture.  Stairs            Wheelchair Mobility    Modified  Rankin (Stroke Patients Only)       Balance Overall balance assessment: Needs assistance Sitting-balance support: Feet supported;No upper extremity supported;Bilateral upper extremity supported Sitting balance-Leahy Scale: Poor Sitting balance - Comments: max A initially for poor static sitting balance then progressed to SBA for fair balance with BUE supported on RW placed anteriorly to pt Postural control: Posterior lean Standing balance support: Bilateral upper extremity supported Standing balance-Leahy Scale: Poor Standing balance comment: poor standing balance requiring max A for remaining upright with BUE on clinician                             Pertinent Vitals/Pain Pain Assessment: No/denies pain Faces Pain Scale: No hurt    Home Living Family/patient expects to be discharged to:: Private residence Living Arrangements: Alone Available Help at Discharge: Family;Available PRN/intermittently Type of Home: Independent living facility Home Access: Stairs to enter;Level entry   Entrance Stairs-Number of Steps: unsure Home Layout: One level Home Equipment: Walker - 4 wheels;Walker - 2 wheels;Cane - single point Additional Comments: Pt states that there are steps to enter in the front however the place is built in a hill and the back door has level entry.    Prior Function Level of Independence: Independent with assistive device(s)         Comments: Pt reports utilizing RW for long walks and denies AD usage in the home at ILF; pt denies falls in last 6 months     Hand Dominance   Dominant Hand: Right    Extremity/Trunk Assessment   Upper Extremity Assessment Upper Extremity Assessment: Generalized weakness (grossly 3+ to 4-/5 bilaterally)    Lower Extremity Assessment Lower Extremity Assessment: Generalized weakness (grossly 4- to 4/5 bilaterally)       Communication   Communication: No difficulties  Cognition Arousal/Alertness:  Awake/alert Behavior During Therapy: WFL for tasks assessed/performed Overall Cognitive Status: No family/caregiver present to determine baseline cognitive functioning Area of Impairment: Orientation;Memory;Following commands;Safety/judgement;Awareness;Problem solving                 Orientation Level: Person Current Attention Level: Sustained Memory: Decreased short-term memory Following Commands: Follows one step commands inconsistently Safety/Judgement: Decreased awareness of safety;Decreased awareness of deficits Awareness: Intellectual Problem Solving: Slow processing;Decreased initiation;Difficulty sequencing;Requires verbal cues;Requires tactile cues General Comments: Pt reports DOB as [birth year] [id number]      General Comments      Exercises Other Exercises Other Exercises: in sitting: alternating marches, LAQ, and hip ab/add x 10 bilaterally; verbal cues and demonstration for proper technique   Assessment/Plan    PT Assessment Patient needs continued PT services  PT Problem List Decreased strength;Decreased activity tolerance;Decreased balance;Decreased mobility;Decreased coordination;Decreased cognition;Decreased knowledge of use of DME;Decreased safety awareness       PT Treatment Interventions DME instruction;Gait training;Stair training;Functional mobility training;Therapeutic activities;Therapeutic exercise;Balance training;Cognitive remediation;Patient/family education    PT Goals (Current goals can be found in the Care Plan section)  Acute Rehab PT Goals Patient Stated Goal: to go home PT Goal Formulation: With patient Time For Goal Achievement: 06/17/20 Potential to Achieve Goals: Good    Frequency  Min 2X/week   Barriers to discharge Decreased caregiver support      Co-evaluation               AM-PAC PT "6 Clicks" Mobility  Outcome Measure Help needed turning from your back to your side while in a flat bed without using bedrails?: A  Little Help needed moving from lying on your back to sitting on the side of a flat bed without using bedrails?: A Lot Help needed moving to and from a bed to a chair (including a wheelchair)?: A Lot Help needed standing up from a chair using your arms (e.g., wheelchair or bedside chair)?: A Lot Help needed to walk in hospital room?: A Lot Help needed climbing 3-5 steps with a railing? : A Lot 6 Click Score: 13    End of Session Equipment Utilized During Treatment: Gait belt Activity Tolerance: Patient limited by fatigue Patient left: in chair;with call bell/phone within reach;with chair alarm set Nurse Communication: Mobility status PT Visit Diagnosis: Unsteadiness on feet (R26.81);Other abnormalities of gait and mobility (R26.89);Muscle weakness (generalized) (M62.81)    Time: 0175-1025 PT Time Calculation (min) (ACUTE ONLY): 29 min   Charges:            Vale Haven, SPT  Vale Haven 06/03/2020, 12:36 PM

## 2020-06-05 DIAGNOSIS — M5416 Radiculopathy, lumbar region: Secondary | ICD-10-CM

## 2020-06-05 DIAGNOSIS — E1159 Type 2 diabetes mellitus with other circulatory complications: Secondary | ICD-10-CM | POA: Diagnosis not present

## 2020-06-05 DIAGNOSIS — I10 Essential (primary) hypertension: Secondary | ICD-10-CM

## 2020-06-05 DIAGNOSIS — F3341 Major depressive disorder, recurrent, in partial remission: Secondary | ICD-10-CM

## 2020-06-05 DIAGNOSIS — N390 Urinary tract infection, site not specified: Secondary | ICD-10-CM

## 2020-06-05 DIAGNOSIS — F0150 Vascular dementia without behavioral disturbance: Secondary | ICD-10-CM | POA: Diagnosis not present

## 2020-06-10 ENCOUNTER — Telehealth: Payer: Self-pay | Admitting: Psychiatry

## 2020-06-16 DIAGNOSIS — F0151 Vascular dementia with behavioral disturbance: Secondary | ICD-10-CM

## 2020-06-16 DIAGNOSIS — F22 Delusional disorders: Secondary | ICD-10-CM

## 2021-02-25 ENCOUNTER — Other Ambulatory Visit: Payer: Self-pay | Admitting: Internal Medicine

## 2021-02-25 DIAGNOSIS — Z1231 Encounter for screening mammogram for malignant neoplasm of breast: Secondary | ICD-10-CM

## 2021-09-01 IMAGING — CT CT CHEST W/ CM
2 of 5 series · 12 of 36 positions shown, 15 images · IV contrast (omnipaque)
Comparison: CT AP 11/07/2017

CLINICAL DATA: 50 pound weight loss. Anorexia.

EXAM:
CT CHEST, ABDOMEN, AND PELVIS WITH CONTRAST
TECHNIQUE: Multidetector CT imaging of the chest, abdomen and pelvis was
performed following the standard protocol during bolus
administration of intravenous contrast.
CONTRAST:  100mL OMNIPAQUE IOHEXOL 300 MG/ML  SOLN

[Series 2: axials cap 5.00 · axial · 0.73mm/px · z∈[-1488,-948]mm · 9 of 132 slices shown, 12 images]
[im 12/132  mediastinal]
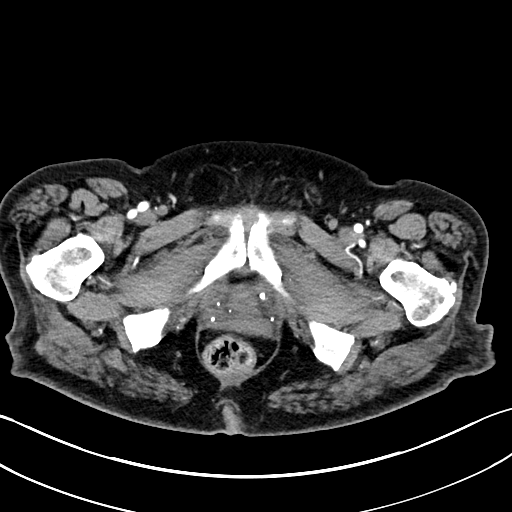
[im 12/132  lung]
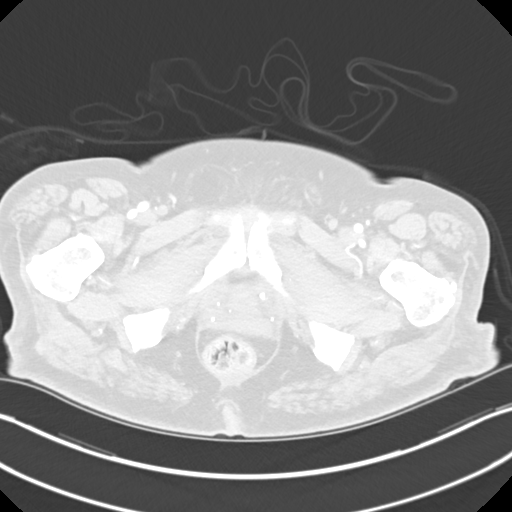
[im 24/132  lung]
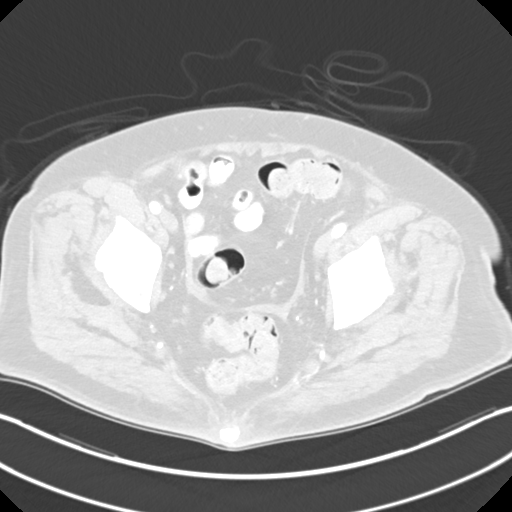
[im 36/132  lung]
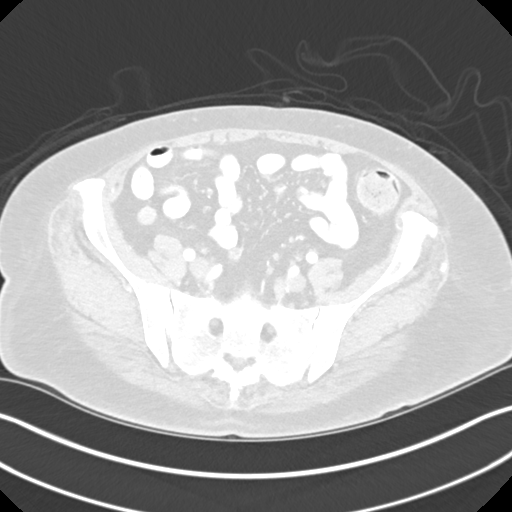
[im 48/132  lung]
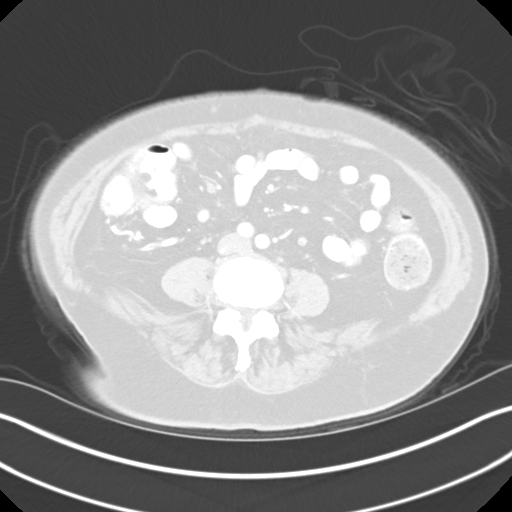
[im 72/132  mediastinal]
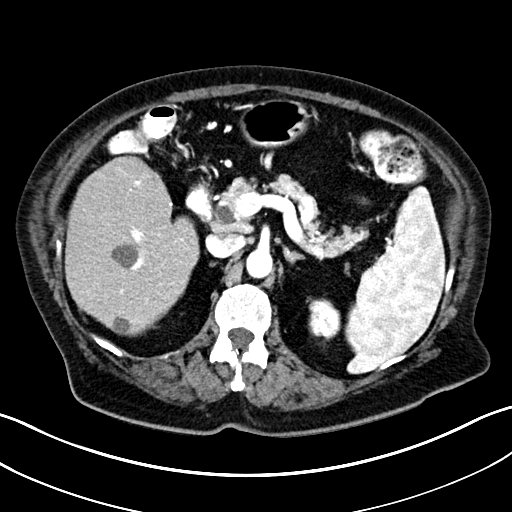
[im 72/132  lung]
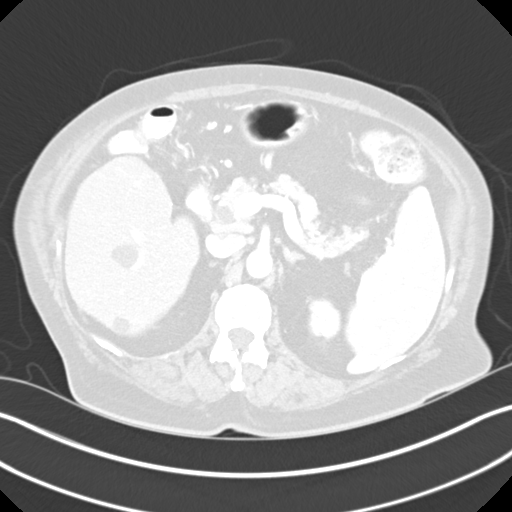
[im 84/132  lung]
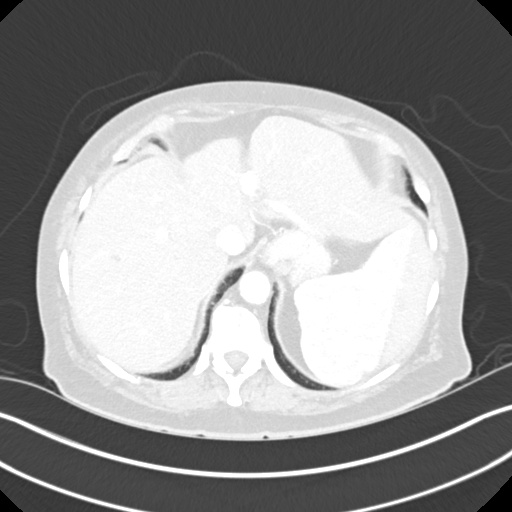
[im 96/132  lung]
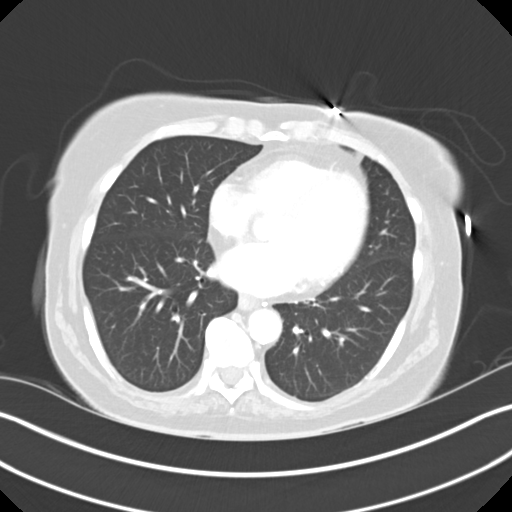
[im 108/132  lung]
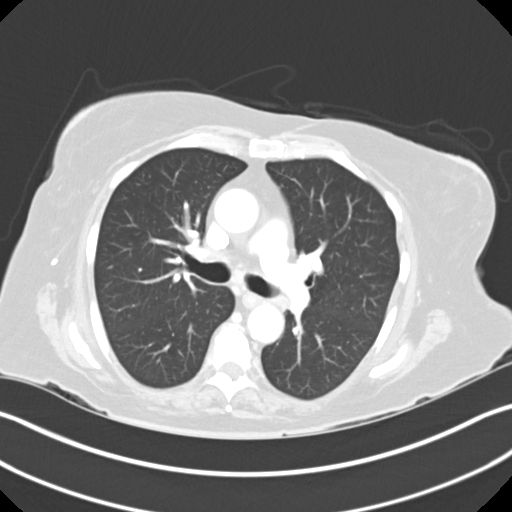
[im 120/132  mediastinal]
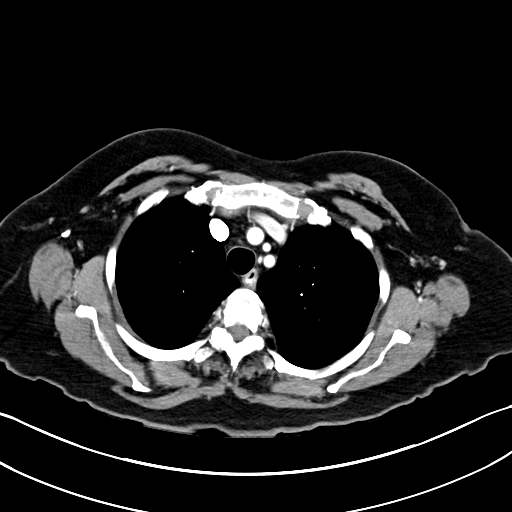
[im 120/132  lung]
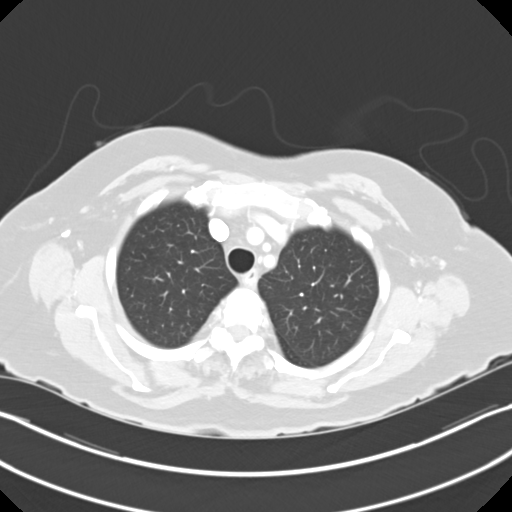

[Series 4: coronals cap 2.00 cor · coronal · 0.73mm/px · 3 of 143 slices shown]
[im 29/143  lung]
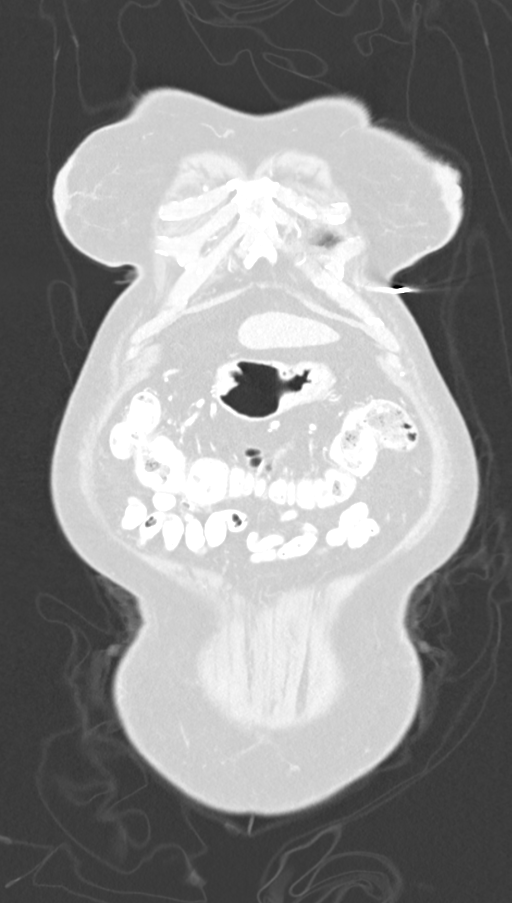
[im 57/143  lung]
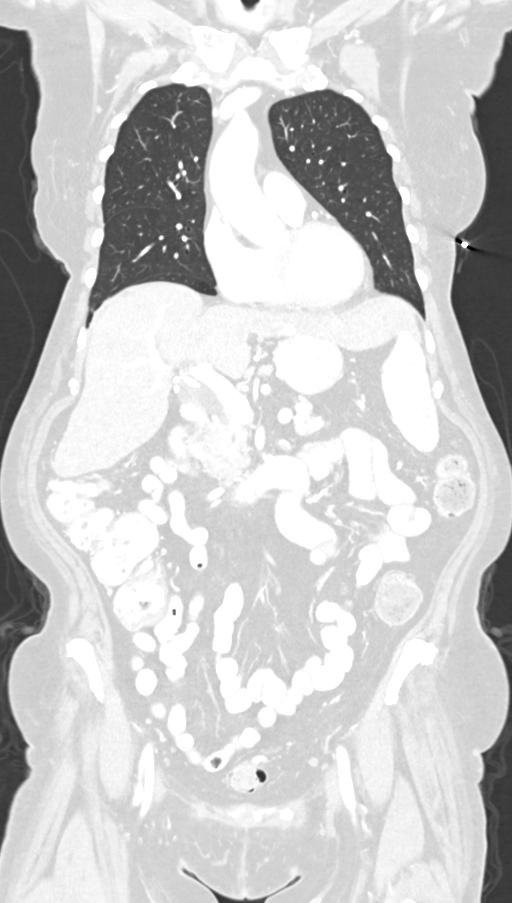
[im 86/143  lung]
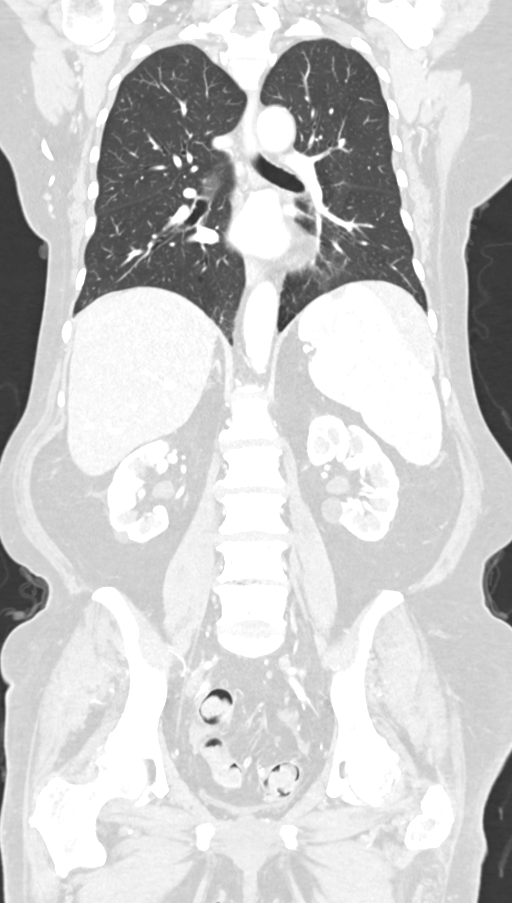

[12 of 36 positions shown; findings below may reference images not displayed]

FINDINGS: CT CHEST FINDINGS

Cardiovascular: The heart size appears within normal limits. No
pericardial effusion. Aortic atherosclerosis. Lad, left circumflex
and RCA coronary artery calcifications.

Mediastinum/Nodes: Normal appearance of the thyroid gland. The
trachea appears patent and is midline. Normal appearance of the
esophagus. At the T1 level there is a paravertebral no enlarged
mediastinal or hilar lymph nodes. Ovoid soft tissue nodule measuring
2.3 x 1.7 cm, image [DATE]. On cervical spine CT from 04/25/2017 this
was present measuring 1.8 x 1.3 cm. This was also noted on MRI of
the thoracic spine from 02/03/2018 and was favored to represent a
benign neurogenic tumor.

Lungs/Pleura: No pleural effusion. No airspace consolidation,
atelectasis, or pneumothorax. 2 mm right middle lobe perifissural
nodule is identified, image 85/3.

Musculoskeletal: Spondylosis identified within the thoracic spine.
No aggressive lytic or sclerotic bone lesions.

CT ABDOMEN PELVIS FINDINGS

Hepatobiliary: There is mild diffuse hepatic steatosis. There is a
macrolobular contour the liver with hypertrophy of the lateral
segment of left lobe of liver. Squaring of the caudate lobe is also
noted. The several simple appearing cysts are identified. Previous
cholecystectomy. Common bile duct is increased in caliber measuring
up to 1 cm. No choledocholithiasis or mass identified.

Pancreas: Unremarkable. No pancreatic ductal dilatation or
surrounding inflammatory changes.

Spleen: The spleen measures 14.0 by 11.7 by 6.0 cm (volume = 510
cm^3) calcified granulomas identified within the spleen.

Adrenals/Urinary Tract: Adrenal glands are unremarkable. Mild
bilateral renal volume loss scarring. No mass or hydronephrosis.
Small bilateral kidney cysts. Bladder is unremarkable.

Stomach/Bowel: Mid the appendix is abnormally thickened with
enhancement. No surrounding inflammatory changes identified. The
appendix measures 1.2 cm, image 65/4. This is a new finding when
compared with previous exam. The primary differential considerations
include appendiceal neoplasm versus appendicitis.

Vascular/Lymphatic: Aortic atherosclerosis. Small upper abdominal
lymph nodes identified without adenopathy. No pelvic or inguinal
adenopathy.

Reproductive: Status post hysterectomy. No adnexal masses.

Other: No free fluid or fluid collections.

Musculoskeletal: No acute or significant osseous findings.
IMPRESSION: 1. Abnormal thickening and enhancement of the mid appendix is
identified. The primary differential considerations include
appendiceal neoplasm versus appendicitis. Careful clinical
correlation is advised.
2. Morphologic features of the liver compatible with cirrhosis.
3. Splenomegaly.
4. Multi vessel coronary artery atherosclerotic calcifications.
5. Left paravertebral soft tissue nodule is identified at the T1
level and is favored to represent a neurogenic tumor. This was
present on MRI of the thoracic spine from 02/03/2018 and is favored
to represent a benign abnormality.
6. 2 mm right middle lobe lung nodule. No follow-up needed if
patient is low-risk. Non-contrast chest CT can be considered in 12
months if patient is high-risk. This recommendation follows the
consensus statement: Guidelines for Management of Incidental
Pulmonary Nodules Detected on CT Images: From the [HOSPITAL]
7. Coronary artery calcifications.

Aortic Atherosclerosis ([id number]).

These results will be called to the ordering clinician or
representative by the Radiologist Assistant, and communication
documented in the PACS or [REDACTED].
